# Patient Record
Sex: Male | Born: 1940 | Race: White | Hispanic: No | Smoking: Former smoker
Health system: Southern US, Community
[De-identification: ages and names within clinical notes are randomized; demographics above are authoritative.]

## PROBLEM LIST (undated history)

## (undated) DIAGNOSIS — K5730 Diverticulosis of large intestine without perforation or abscess without bleeding: Secondary | ICD-10-CM

## (undated) DIAGNOSIS — K635 Polyp of colon: Secondary | ICD-10-CM

## (undated) DIAGNOSIS — J40 Bronchitis, not specified as acute or chronic: Secondary | ICD-10-CM

## (undated) DIAGNOSIS — N400 Enlarged prostate without lower urinary tract symptoms: Secondary | ICD-10-CM

## (undated) DIAGNOSIS — J45909 Unspecified asthma, uncomplicated: Secondary | ICD-10-CM

## (undated) DIAGNOSIS — K449 Diaphragmatic hernia without obstruction or gangrene: Secondary | ICD-10-CM

## (undated) DIAGNOSIS — K219 Gastro-esophageal reflux disease without esophagitis: Secondary | ICD-10-CM

## (undated) DIAGNOSIS — I7121 Aneurysm of the ascending aorta, without rupture: Secondary | ICD-10-CM

## (undated) DIAGNOSIS — I350 Nonrheumatic aortic (valve) stenosis: Secondary | ICD-10-CM

## (undated) DIAGNOSIS — K420 Umbilical hernia with obstruction, without gangrene: Secondary | ICD-10-CM

## (undated) DIAGNOSIS — C801 Malignant (primary) neoplasm, unspecified: Secondary | ICD-10-CM

## (undated) DIAGNOSIS — I77810 Thoracic aortic ectasia: Secondary | ICD-10-CM

## (undated) DIAGNOSIS — R011 Cardiac murmur, unspecified: Secondary | ICD-10-CM

## (undated) DIAGNOSIS — R0602 Shortness of breath: Secondary | ICD-10-CM

## (undated) DIAGNOSIS — Z953 Presence of xenogenic heart valve: Secondary | ICD-10-CM

## (undated) DIAGNOSIS — J3089 Other allergic rhinitis: Secondary | ICD-10-CM

## (undated) DIAGNOSIS — C4491 Basal cell carcinoma of skin, unspecified: Secondary | ICD-10-CM

## (undated) DIAGNOSIS — I5189 Other ill-defined heart diseases: Secondary | ICD-10-CM

## (undated) DIAGNOSIS — N529 Male erectile dysfunction, unspecified: Secondary | ICD-10-CM

## (undated) DIAGNOSIS — I2510 Atherosclerotic heart disease of native coronary artery without angina pectoris: Secondary | ICD-10-CM

## (undated) HISTORY — DX: Aneurysm of the ascending aorta, without rupture: I71.21

## (undated) HISTORY — PX: KNEE ARTHROSCOPY: SUR90

## (undated) HISTORY — DX: Other ill-defined heart diseases: I51.89

## (undated) HISTORY — DX: Presence of xenogenic heart valve: Z95.3

## (undated) HISTORY — PX: CARDIAC CATHETERIZATION: SHX172

## (undated) HISTORY — DX: Other allergic rhinitis: J30.89

## (undated) HISTORY — PX: SHOULDER ARTHROSCOPY: SHX128

## (undated) HISTORY — DX: Unspecified asthma, uncomplicated: J45.909

## (undated) HISTORY — DX: Diverticulosis of large intestine without perforation or abscess without bleeding: K57.30

## (undated) HISTORY — PX: COLONOSCOPY W/ POLYPECTOMY: SHX1380

## (undated) HISTORY — DX: Gastro-esophageal reflux disease without esophagitis: K21.9

## (undated) HISTORY — DX: Basal cell carcinoma of skin, unspecified: C44.91

## (undated) HISTORY — DX: Diaphragmatic hernia without obstruction or gangrene: K44.9

## (undated) HISTORY — DX: Nonrheumatic aortic (valve) stenosis: I35.0

## (undated) HISTORY — DX: Atherosclerotic heart disease of native coronary artery without angina pectoris: I25.10

## (undated) HISTORY — DX: Benign prostatic hyperplasia without lower urinary tract symptoms: N40.0

## (undated) HISTORY — PX: FEMORAL HERNIA REPAIR: SHX632

## (undated) HISTORY — PX: TONSILLECTOMY: SUR1361

## (undated) HISTORY — PX: APPENDECTOMY: SHX54

## (undated) HISTORY — DX: Male erectile dysfunction, unspecified: N52.9

## (undated) HISTORY — DX: Thoracic aortic ectasia: I77.810

## (undated) HISTORY — DX: Polyp of colon: K63.5

## (undated) HISTORY — PX: HERNIA REPAIR: SHX51

---
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
## 2000-02-19 ENCOUNTER — Ambulatory Visit (HOSPITAL_BASED_OUTPATIENT_CLINIC_OR_DEPARTMENT_OTHER): Admission: RE | Admit: 2000-02-19 | Discharge: 2000-02-19 | Payer: Self-pay | Admitting: *Deleted

## 2002-11-03 ENCOUNTER — Encounter (INDEPENDENT_AMBULATORY_CARE_PROVIDER_SITE_OTHER): Payer: Self-pay | Admitting: Gastroenterology

## 2003-12-07 ENCOUNTER — Ambulatory Visit: Payer: Self-pay | Admitting: Gastroenterology

## 2005-05-15 ENCOUNTER — Encounter: Admission: RE | Admit: 2005-05-15 | Discharge: 2005-05-15 | Payer: Self-pay | Admitting: Orthopedic Surgery

## 2005-05-16 ENCOUNTER — Encounter: Admission: RE | Admit: 2005-05-16 | Discharge: 2005-05-16 | Payer: Self-pay | Admitting: Orthopedic Surgery

## 2008-02-10 ENCOUNTER — Encounter: Admission: RE | Admit: 2008-02-10 | Discharge: 2008-02-10 | Payer: Self-pay | Admitting: Family Medicine

## 2010-06-21 NOTE — Op Note (Signed)
[place]. [facility]  Patient:    Tim Hardy, Tim Hardy                      MRN: [id number] Proc. Date: 02/19/00 Attending:  Maisie Fus B. Samuella Cota, M.D. CC:         Quita Skye. Artis Flock, M.D.                           Operative Report  CCS# [id number]  PREOPERATIVE DIAGNOSIS:  Recurrent right inguinal hernia.  POSTOPERATIVE DIAGNOSIS:  Recurrent right inguinal hernia.  OPERATION PERFORMED:  Repair of recurrent right inguinal hernia with mesh.  SURGEON:  Maisie Fus B. Samuella Cota, M.D.  ANESTHESIA:  General.  ANESTHESIOLOGIST:  Dr. Krista Blue and CRNA.  DESCRIPTION OF PROCEDURE:  The patient was taken to the operating room and placed the table in supine position.  After satisfactory general anesthetic, the right lower quadrant of the abdomen was prepped and draped in the sterile field.  Even though the patient was under general anesthetic, I injected 10 cc of 0.25% without epinephrine and 1% Xylocaine without epinephrine.  This was used to block the ilioinguinal nerve in the area for incision underlying breast tissue.  The incision was made through the old scar with no attempt to remove the old scar.  Dissection revealed that the cord structures were in the subcutaneous position and were not below the external oblique aponeurosis. The dissection was then taken down to the external oblique aponeurosis inferiorly and medially.  The patient had a hernia the size of a large golf ball which was protruding from the inferior medial aspect of the inguinal floor.  The external oblique aponeurosis was divided up close to the internal ring.  The cord structures came out of the internal ring through the external oblique and were adherent in the subcutaneous tissues and it was felt best to leave the cord structures adhered to the subcutaneous tissues and not try to take them down.  There was no defect at the internal ring.  The hernia could not be reduced without making a bigger opening in the  inguinal floor inferiorly and medially.  The hernia sac was then reduced.  The external oblique aponeurosis was dissected back superiorly and inferiorly.  The hernia sac was reduced and an imbricating suture of 0 chromic catgut was used to keep the hernia sac reduced.  A piece of atrium mesh was then fashioned to cover the inguinal floor and extend up to the inferior aspect of the internal ring. It was felt best not to take down the internal ring around the cord structures because of the marked adhesions.  The mesh was sutured inferiorly to the shelving edge of Pouparts ligament using a running 2-0 Novofil.  It was anchored superiorly and medially with interrupted sutures of 0 Novofil.  The mesh was lying nicely over the entire inguinal floor extending across the midline at the symphysis.  The mesh stopped short of the internal ring.  The external oblique aponeurosis was then closed over the mesh using interrupted sutures of 3-0 Vicryl.  The wound was irrigated.  The cord structures were still adherent to the subcutaneous tissues. The subcutaneous tissues were then approximated with interrupted sutures of 3-0 Vicryl.  The skin was closed with running subcuticular suture of 5-0 Vicryl suture.  Benzoin and half-inch Steri-Strips were used to reinforce the skin closure.  Dry sterile dressing was applied.  The patient seemed to tolerate the  procedure well and was taken to the PACU in satisfactory condition. DD:  02/19/00 TD:  02/19/00 Job: [id number]

## 2011-07-31 ENCOUNTER — Other Ambulatory Visit: Payer: Self-pay

## 2011-10-16 ENCOUNTER — Other Ambulatory Visit: Payer: Self-pay | Admitting: Cardiology
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
## 2011-10-23 ENCOUNTER — Inpatient Hospital Stay (HOSPITAL_BASED_OUTPATIENT_CLINIC_OR_DEPARTMENT_OTHER)
Admission: RE | Admit: 2011-10-23 | Discharge: 2011-10-23 | Disposition: A | Discharge status: Home / Self Care | Payer: Medicare Other | Source: Ambulatory Visit | Attending: Cardiology | Admitting: Cardiology

## 2011-10-23 ENCOUNTER — Encounter (HOSPITAL_BASED_OUTPATIENT_CLINIC_OR_DEPARTMENT_OTHER): Payer: Self-pay

## 2011-10-23 ENCOUNTER — Encounter (HOSPITAL_BASED_OUTPATIENT_CLINIC_OR_DEPARTMENT_OTHER)
Admission: RE | Disposition: A | Discharge status: Home / Self Care | Payer: Self-pay | Source: Ambulatory Visit | Attending: Cardiology

## 2011-10-23 DIAGNOSIS — I2510 Atherosclerotic heart disease of native coronary artery without angina pectoris: Secondary | ICD-10-CM | POA: Insufficient documentation

## 2011-10-23 DIAGNOSIS — I359 Nonrheumatic aortic valve disorder, unspecified: Secondary | ICD-10-CM | POA: Diagnosis present

## 2011-10-23 DIAGNOSIS — R9439 Abnormal result of other cardiovascular function study: Secondary | ICD-10-CM | POA: Diagnosis present

## 2011-10-23 DIAGNOSIS — R0602 Shortness of breath: Secondary | ICD-10-CM | POA: Insufficient documentation

## 2011-10-23 DIAGNOSIS — R0789 Other chest pain: Secondary | ICD-10-CM | POA: Insufficient documentation

## 2011-10-23 LAB — POCT I-STAT 3, VENOUS BLOOD GAS (G3P V)
Bicarbonate: 26.9 mEq/L — ABNORMAL HIGH (ref 20.0–24.0)
pH, Ven: 7.375 — ABNORMAL HIGH (ref 7.250–7.300)
pO2, Ven: 36 mmHg (ref 30.0–45.0)

## 2011-10-23 LAB — POCT I-STAT 3, ART BLOOD GAS (G3+): Bicarbonate: 25.7 mEq/L — ABNORMAL HIGH (ref 20.0–24.0)

## 2011-10-23 SURGERY — JV LEFT AND RIGHT HEART CATHETERIZATION WITH CORONARY ANGIOGRAM
Anesthesia: Moderate Sedation

## 2011-10-23 MED ORDER — SODIUM CHLORIDE 0.9 % IV SOLN: 1.0000 mL/kg/h | INTRAVENOUS | Status: DC

## 2011-10-23 MED ORDER — SODIUM CHLORIDE 0.9 % IJ SOLN: 3.0000 mL | Freq: Two times a day (BID) | INTRAMUSCULAR | Status: DC

## 2011-10-23 MED ORDER — SODIUM CHLORIDE 0.9 % IJ SOLN: 3.0000 mL | INTRAMUSCULAR | Status: DC | PRN

## 2011-10-23 MED ORDER — SODIUM CHLORIDE 0.9 % IV SOLN: 250.0000 mL | INTRAVENOUS | Status: DC | PRN

## 2011-10-23 MED ORDER — ONDANSETRON HCL 4 MG/2ML IJ SOLN: 4.0000 mg | Freq: Four times a day (QID) | INTRAMUSCULAR | Status: DC | PRN

## 2011-10-23 MED ORDER — DIAZEPAM 5 MG PO TABS
5.0000 mg | ORAL_TABLET | ORAL | Status: AC
  Administered 2011-10-23: 5 mg via ORAL

## 2011-10-23 MED ORDER — SODIUM CHLORIDE 0.9 % IV SOLN: INTRAVENOUS | Status: DC

## 2011-10-23 MED ORDER — ACETAMINOPHEN 325 MG PO TABS: 650.0000 mg | ORAL_TABLET | ORAL | Status: DC | PRN

## 2011-10-23 MED ORDER — ASPIRIN 81 MG PO CHEW
324.0000 mg | CHEWABLE_TABLET | ORAL | Status: AC
  Administered 2011-10-23: 324 mg via ORAL

## 2011-10-23 NOTE — Interval H&P Note (Signed)
History and Physical Interval Note:  10/23/2011 8:35 AM  Tim Hardy  has presented today for surgery, with the diagnosis of AS  The various methods of treatment have been discussed with the patient and family. After consideration of risks, benefits and other options for treatment, the patient has consented to  Procedure(s) (LRB) with comments: JV LEFT AND RIGHT HEART CATHETERIZATION WITH CORONARY ANGIOGRAM (N/A) as a surgical intervention .  The patient's history has been reviewed, patient examined, no change in status, stable for surgery.  I have reviewed the patient's chart and labs.  Questions were answered to the patient's satisfaction.     SKAINS, MARK  After obtaining this information, I will be referring to cardiothoracic surgery.

## 2011-10-23 NOTE — OR Nursing (Signed)
Meal served 

## 2011-10-23 NOTE — OR Nursing (Signed)
Discharge instructions reviewed and signed, pt stated understanding, ambulated in hall without difficulty, site level 0, transported to friend's car via wheelchair 

## 2011-10-23 NOTE — CV Procedure (Addendum)
CARDIAC CATHETERIZATION  PROCEDURE:  Right and left heart catheterization with selective coronary angiography, ascending aortogram, abdominal aortogram, cardiac outputs, selective oxygen saturations.  INDICATIONS:  71 year old male with severe aortic stenosis, severe calcification of aortic valve, cannot exclude bicuspid valve with peak velocity of 5.2 m/s, mean gradient greater than 40 mm mercury with symptoms of shortness of breath. Poor exercise tolerance on treadmill with ST segment depression consistent with ischemia.  The risks, benefits, and details of the procedure were explained to the patient, including stroke, heart attack, death, renal impairment, arterial damage, bleeding.  The patient verbalized understanding and wanted to proceed.  Informed written consent was obtained.  PROCEDURE TECHNIQUE:  After Xylocaine anesthesia and visualization of the femoral head via fluoroscopy, a 49F sheath was placed in the right femoral artery using the modified Seldinger technique.  A 7 French sheath was inserted into the right femoral vein using the modified Seldinger technique. Left coronary angiography was done using a Judkins L4 catheter.  Right coronary angiography was done using a Judkins R4 catheter. Multiple views with hand injection of Omnipaque were obtained. A straight wire was utilized with the Judkins right catheter to attempt to cross the aortic valve. This was unsuccessful. A pigtail catheter was utilized for ascending aortogram as well as a descending aortogram. No evidence of coarctation or aneurysm. Right heart catheterization was performed with a balloon tipped Swan-Ganz catheter traversing the right-sided heart structures to the wedge position. Oxygen saturation was drawn in the pulmonary artery as well as femoral artery. Hemodynamics were obtained. Cardiac outputs were obtained utilizing the Fick and thermodilution methods. Following the procedure, sheaths were removed and patient was  hemodynamically stable.   CONTRAST:  Total of 100 ml.  FLOUROSCOPY TIME: 7.9 minutes.  COMPLICATIONS:  None.    HEMODYNAMICS:    Right atrium (RA): 10/8/7 mmHg Right ventricle (RV): 34/8/10 mmHg Pulmonary artery (PA): 34/15/23 mmHg Pulmonary capillary wedge pressure (PCWP): 17/14/12 mmHg  Cardiac output: Fick 5.6 liters/min, Thermodilution 4.4 liters/min Cardiac index: Fick 2.4, Thermodilution 1.9  PA saturation: 68 % FA saturation: 95 %   Aortic pressure: 117/67/88 mmHg LV pressure: Not obtained.     ANGIOGRAPHIC DATA:    Left main: No angiographically significant coronary artery disease branches into LAD and circumflex artery  Left anterior descending (LAD): There is mid LAD calcification at the bifurcation of a mid diagonal branch. Mild stenosis of approximately 30-40% noted in that region. No flow limiting disease present. The LAD then continues to wrap around the apex. There are 5 relatively small caliber diagonal branches. The first 2 proximal branches demonstrate minor ostial disease.  Circumflex artery (CIRC): There are 3 obtuse marginal branches. No angiographically significant disease present.  Right coronary artery (RCA): Dominant vessel giving rise to the posterior descending artery. No angiographically significant disease.  Ascending aortogram/descending aortogram: Performed in the LAO position. There is no evidence of coarctation of the aorta. There is no evidence of abdominal aortic aneurysm. Common iliacs are widely patent. Common femoral arteries are widely patent. Minor aortic sclerosis in the ascending aorta noted. Minor calcification of the left subclavian proximal artery noted. Severe calcification of the aortic valve with limited cusp excursion. IMPRESSIONS:  1. 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 No flow limiting disease. 2. No angiographic evidence of coarctation/abdominal aortic  aneurysm. 3. Normal right-sided heart pressures. 4. Normal cardiac output. 5.  Severely calcified aortic valve. Echocardiogram demonstrates severe aortic stenosis.  RECOMMENDATION:  I have put in referral  for cardiothoracic surgery for aortic valve replacement in the setting of symptomatic severe aortic stenosis. Once again, I cannot exclude bicuspid aortic valve based upon transthoracic echocardiogram. There does not appear to be evidence of coarctation. If transesophageal echocardiogram is needed prior to surgery, I will be happy to perform at the request of surgery. Explained findings to patient.
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
## 2011-10-23 NOTE — OR Nursing (Signed)
Dr Anne Fu at bedside to discuss results and treatment plan with pt and friend

## 2011-10-23 NOTE — OR Nursing (Signed)
Tegaderm dressing applied, site level 0, bedrest begins 0935

## 2011-10-24 ENCOUNTER — Encounter (HOSPITAL_BASED_OUTPATIENT_CLINIC_OR_DEPARTMENT_OTHER): Payer: Self-pay

## 2011-10-28 ENCOUNTER — Institutional Professional Consult (permissible substitution) (INDEPENDENT_AMBULATORY_CARE_PROVIDER_SITE_OTHER): Payer: Medicare Other | Admitting: Thoracic Surgery (Cardiothoracic Vascular Surgery)

## 2011-10-28 ENCOUNTER — Encounter: Payer: Self-pay | Admitting: Thoracic Surgery (Cardiothoracic Vascular Surgery)

## 2011-10-28 VITALS — BP 138/88 | HR 94 | Resp 16 | Ht 73.0 in | Wt 236.0 lb

## 2011-10-28 DIAGNOSIS — J45909 Unspecified asthma, uncomplicated: Secondary | ICD-10-CM

## 2011-10-28 DIAGNOSIS — I350 Nonrheumatic aortic (valve) stenosis: Secondary | ICD-10-CM

## 2011-10-28 DIAGNOSIS — I359 Nonrheumatic aortic valve disorder, unspecified: Secondary | ICD-10-CM

## 2011-10-28 NOTE — Progress Notes (Signed)
PCP is GRIFFIN,JOHN JOSEPH, MD Referring Provider is Skains, Mark, MD  Chief Complaint  Patient presents with  . Aortic Stenosis    eval and treat...ECHO/CATH    HPI: 71-year-old gentleman presents with chief complaint of shortness of breath with exertion.  Tim Hardy is a 71-year-old gentleman with known aortic stenosis. He recently has been having shortness of breath with exertion. He first noticed a couple of years ago but it did pretty significant exertion because the shortness of breath. He's had allergies for 50 years and asthma for a long time and he first attributed his symptoms to those problems. Over the last several months however he's noted that he gets severely short of breath with only mild exertion, such as walking 100 -150 feet. He also feels very tired and fatigued when he exerts himself. He also notes shortness of breath and fatigue when he bends over to try to lift anything. He had an echocardiogram in September of 2012 which showed moderate to severe aortic stenosis, but the valve was not visualized well. He went to Dr. Griffin because of progressive shortness of breath and fatigue and was referred to Dr. Skains. An echocardiogram showed severe aortic stenosis with a V-max of 494 cm/s, peak gradient of 98 mm mercury, mean gradient of 64 mm mercury, and a calculated valve area of 0.5 cm. He then underwent cardiac catheterization. The aortic valve could not be crossed. His right-sided pressures were normal and his cardiac index was 2.4 by Fick and 1.9 by thermodilution. 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 BPH (benign prostatic hyperplasia)   . GERD (gastroesophageal reflux disease)   . Perennial allergic rhinitis   . Aortic valve stenosis, moderate     LVH  . Diastolic dysfunction   . Asthma   . Colon polyps   . Colon, diverticulosis   . Hiatal hernia   . ED (erectile  dysfunction)     No past surgical history on file.  Family History  Problem Relation Age of Onset  . Cancer Father     lung and colon  . Hypertension Father   . Diabetes Sister   . Diabetes Son     Social History History  Substance Use Topics  . Smoking status: Former Smoker    Types: Cigarettes    Start date: 02/04/1971  . Smokeless tobacco: Current User    Types: Chew  . Alcohol Use: No    Current Outpatient Prescriptions  Medication Sig Dispense Refill  . acetaminophen-codeine 120-12 MG/5ML solution Take 10 mLs by mouth every 6 (six) hours as needed.       . albuterol (PROVENTIL HFA;VENTOLIN HFA) 108 (90 BASE) MCG/ACT inhaler Inhale 2 puffs into the lungs every 6 (six) hours as needed.      . aspirin 81 MG tablet Take 81 mg by mouth daily.      . budesonide-formoterol (SYMBICORT) 160-4.5 MCG/ACT inhaler Inhale 2 puffs into the lungs 2 (two) times daily.      . cetirizine (ZYRTEC) 10 MG tablet Take 10 mg by mouth daily.      . fluticasone (FLONASE) 50 MCG/ACT nasal spray Place 2 sprays into the nose daily.      . NEXIUM 40 MG capsule Take 40 mg by mouth daily before breakfast.       . sildenafil (VIAGRA) 50 MG tablet Take 50 mg by mouth daily as needed.      .   Tamsulosin HCl (FLOMAX) 0.4 MG CAPS Take 0.4 mg by mouth daily after supper.         Allergies  Allergen Reactions  . Penicillins Rash    Review of Systems  Constitutional: Positive for activity change (decreased energy).  HENT:       Saw dentist 5/13- no issues  Respiratory: Positive for shortness of breath (with mild exertion, increasing frequency and severity) and wheezing.   Cardiovascular: Negative for palpitations and leg swelling.  Gastrointestinal:       Reflux  Genitourinary: Negative.   Neurological: Negative.   Hematological: Bruises/bleeds easily (on aspirin).  Psychiatric/Behavioral: Negative.   All other systems reviewed and are negative.    BP 138/88  Pulse 94  Resp 16  Ht 6' 1"  (1.854 m)  Wt 236 lb (107.049 kg)  BMI 31.14 kg/m2  SpO2 93% Physical Exam  Constitutional: He is oriented to person, place, and time. He appears well-developed and well-nourished. No distress.  HENT:  Head: Normocephalic and atraumatic.  Eyes: EOM are normal. Pupils are equal, round, and reactive to light.  Neck: No JVD present. No thyromegaly present.  Cardiovascular: Normal rate and regular rhythm.   Murmur (3/6 harsh murmur heard throughout precordium) heard. Pulmonary/Chest: Effort normal and breath sounds normal. He has no wheezes. He has no rales.  Abdominal: Soft. There is no tenderness.       Umbilical hernia  Musculoskeletal: Normal range of motion. He exhibits no edema.  Lymphadenopathy:    He has no cervical adenopathy.  Neurological: He is alert and oriented to person, place, and time. No cranial nerve deficit.       No motor deficit  Skin: Skin is warm and dry.  Psychiatric: He has a normal mood and affect.     Diagnostic Tests: Echocardiogram 10/15/2011 and cardiac catheterization 10/23/2011 as previously noted  Impression: 71-year-old gentleman with symptomatic, critical aortic stenosis. Aortic valve replacement is indicated for survival benefit as well as relief of symptoms.  I discussed with Tim Hardy and his friend who accompanied him the valve options. He understands the major categories of prosthetic valves include mechanical and tissue valves. We discussed the relative advantages and disadvantages of each. Given his age of 71 a tissue valve would be the valve of choice. He agrees to tissue valve replacement. He had questions regarding newer percutaneous approaches. I explained to him that he is not a candidate for that procedure as he is a good candidate for conventional valve replacement.  I discussed in detail with them the indications, risks, benefits, and alternatives. We talked about the surgical approach, use of right coronary bypass, the incisions to  be used, need for general anesthesia, expected hospital stay and overall recovery. They understand the risks of aortic valve replacement include, but are not limited to, death, MI, stroke, DVT, PE, bleeding, possible need for transfusion, infection, complete heart block requiring permanent pacemaker, cardiac arrhythmias, other organ system dysfunction including respiratory, renal, or GI complications. They also understand there is a possibility of unforeseeable complications. He understands and accepts these risks and wishes to proceed.  Plan: Aortic valve replacement be a partial sternotomy on Monday, 11/03/2011 

## 2011-10-29 ENCOUNTER — Other Ambulatory Visit: Payer: Self-pay | Admitting: *Deleted

## 2011-10-29 ENCOUNTER — Other Ambulatory Visit: Payer: Self-pay | Admitting: Thoracic Surgery (Cardiothoracic Vascular Surgery)

## 2011-10-29 DIAGNOSIS — I359 Nonrheumatic aortic valve disorder, unspecified: Secondary | ICD-10-CM

## 2011-10-30 ENCOUNTER — Ambulatory Visit (HOSPITAL_COMMUNITY)
Admission: RE | Admit: 2011-10-30 | Discharge: 2011-10-30 | Disposition: A | Discharge status: Home / Self Care | Payer: Medicare Other | Source: Ambulatory Visit | Attending: Thoracic Surgery (Cardiothoracic Vascular Surgery) | Admitting: Thoracic Surgery (Cardiothoracic Vascular Surgery)

## 2011-10-30 ENCOUNTER — Encounter (HOSPITAL_COMMUNITY): Payer: Self-pay | Admitting: Pharmacy Technician

## 2011-10-30 ENCOUNTER — Encounter (HOSPITAL_COMMUNITY): Payer: Self-pay

## 2011-10-30 ENCOUNTER — Inpatient Hospital Stay (HOSPITAL_COMMUNITY)
Admission: RE | Admit: 2011-10-30 | Discharge: 2011-10-30 | Disposition: A | Discharge status: Home / Self Care | Payer: Medicare Other | Source: Ambulatory Visit | Attending: Thoracic Surgery (Cardiothoracic Vascular Surgery) | Admitting: Thoracic Surgery (Cardiothoracic Vascular Surgery)

## 2011-10-30 ENCOUNTER — Encounter (HOSPITAL_COMMUNITY)
Admission: RE | Admit: 2011-10-30 | Discharge: 2011-10-30 | Disposition: A | Discharge status: Home / Self Care | Payer: Medicare Other | Source: Ambulatory Visit | Attending: Thoracic Surgery (Cardiothoracic Vascular Surgery) | Admitting: Thoracic Surgery (Cardiothoracic Vascular Surgery)

## 2011-10-30 ENCOUNTER — Other Ambulatory Visit (HOSPITAL_COMMUNITY): Payer: Self-pay

## 2011-10-30 VITALS — BP 132/72 | HR 85 | Temp 99.3°F | Resp 20 | Ht 73.0 in | Wt 235.8 lb

## 2011-10-30 DIAGNOSIS — Z01818 Encounter for other preprocedural examination: Secondary | ICD-10-CM | POA: Insufficient documentation

## 2011-10-30 DIAGNOSIS — R011 Cardiac murmur, unspecified: Secondary | ICD-10-CM | POA: Insufficient documentation

## 2011-10-30 DIAGNOSIS — I359 Nonrheumatic aortic valve disorder, unspecified: Secondary | ICD-10-CM | POA: Insufficient documentation

## 2011-10-30 DIAGNOSIS — Z01810 Encounter for preprocedural cardiovascular examination: Secondary | ICD-10-CM

## 2011-10-30 DIAGNOSIS — I6529 Occlusion and stenosis of unspecified carotid artery: Secondary | ICD-10-CM | POA: Insufficient documentation

## 2011-10-30 DIAGNOSIS — Z01812 Encounter for preprocedural laboratory examination: Secondary | ICD-10-CM | POA: Insufficient documentation

## 2011-10-30 HISTORY — DX: Shortness of breath: R06.02

## 2011-10-30 HISTORY — DX: Cardiac murmur, unspecified: R01.1

## 2011-10-30 HISTORY — DX: Malignant (primary) neoplasm, unspecified: C80.1

## 2011-10-30 HISTORY — DX: Bronchitis, not specified as acute or chronic: J40

## 2011-10-30 LAB — URINALYSIS, ROUTINE W REFLEX MICROSCOPIC
Glucose, UA: NEGATIVE mg/dL
Ketones, ur: NEGATIVE mg/dL
Leukocytes, UA: NEGATIVE
Protein, ur: NEGATIVE mg/dL
Specific Gravity, Urine: 1.016 (ref 1.005–1.030)
Urobilinogen, UA: 1 mg/dL (ref 0.0–1.0)
pH: 5.5 (ref 5.0–8.0)

## 2011-10-30 LAB — TYPE AND SCREEN

## 2011-10-30 LAB — ABO/RH: ABO/RH(D): A POS

## 2011-10-30 LAB — SURGICAL PCR SCREEN: Staphylococcus aureus: NEGATIVE

## 2011-10-30 LAB — BLOOD GAS, ARTERIAL
Acid-base deficit: 0.3 mmol/L (ref 0.0–2.0)
O2 Saturation: 97.3 %
TCO2: 24.2 mmol/L (ref 0–100)
pCO2 arterial: 33.3 mmHg — ABNORMAL LOW (ref 35.0–45.0)
pO2, Arterial: 88.7 mmHg (ref 80.0–100.0)

## 2011-10-30 LAB — PULMONARY FUNCTION TEST

## 2011-10-30 LAB — COMPREHENSIVE METABOLIC PANEL
ALT: 22 U/L (ref 0–53)
BUN: 13 mg/dL (ref 6–23)
CO2: 22 mEq/L (ref 19–32)
Calcium: 9.4 mg/dL (ref 8.4–10.5)
Creatinine, Ser: 0.95 mg/dL (ref 0.50–1.35)
GFR calc Af Amer: >90 mL/min (ref >90)
GFR calc non Af Amer: 82 mL/min — ABNORMAL LOW (ref >90)
Glucose, Bld: 114 mg/dL — ABNORMAL HIGH (ref 70–99)
Total Protein: 6.5 g/dL (ref 6.0–8.3)

## 2011-10-30 LAB — CBC
Hemoglobin: 14.8 g/dL (ref 13.0–17.0)
MCH: 31.9 pg (ref 26.0–34.0)
MCHC: 34.7 g/dL (ref 30.0–36.0)
MCV: 91.8 fL (ref 78.0–100.0)
RBC: 4.64 MIL/uL (ref 4.22–5.81)

## 2011-10-30 LAB — HEMOGLOBIN A1C
Hgb A1c MFr Bld: 5.5 % (ref <5.7)
Mean Plasma Glucose: 111 mg/dL (ref <117)

## 2011-10-30 LAB — PROTIME-INR: Prothrombin Time: 12.8 seconds (ref 11.6–15.2)

## 2011-10-30 LAB — APTT: aPTT: 33 seconds (ref 24–37)

## 2011-10-30 MED ORDER — ALBUTEROL SULFATE (5 MG/ML) 0.5% IN NEBU
2.5000 mg | INHALATION_SOLUTION | Freq: Once | RESPIRATORY_TRACT | Status: AC
  Administered 2011-10-30: 2.5 mg via RESPIRATORY_TRACT

## 2011-10-30 NOTE — Progress Notes (Signed)
VASCULAR LAB PRELIMINARY  PRELIMINARY  PRELIMINARY  PRELIMINARY  Pre-op Cardiac Surgery  Carotid Findings:  Bilateral:  No evidence of hemodynamically significant internal carotid artery stenosis.   Vertebral artery flow is antegrade.     Upper Extremity Right Left  Brachial Pressures 132 Triphasic 129 Triphasic  Radial Waveforms Triphasic Triphasic  Ulnar Waveforms Triphasic Triphasic  Palmar Arch (Allen's Test) Normal Normal   Findings:  Doppler waveforms remained normal bilaterally with both radial and ulnar compressions                             Mlissa Tamayo, RVS 10/30/2011, 12:37 PM

## 2011-10-30 NOTE — Pre-Procedure Instructions (Signed)
20 Tim Hardy  10/30/2011   Your procedure is scheduled on:  Monday November 03, 2011.  Report to [facility] at 0530 AM.  Call this number if you have problems the morning of surgery: [phone]   Remember:   Do not eat food or drink:After Midnight.    Take these medicines the morning of surgery with A SIP OF WATER: Albuterol inhaler if needed for shortness of breath, Symbicort, Nexium,   Do not wear jewelry.  Do not wear lotions or colognes.  Men may shave face and neck.  Do not bring valuables to the hospital.  Contacts, dentures or bridgework may not be worn into surgery.  Leave suitcase in the car. After surgery it may be brought to your room.  For patients admitted to the hospital, checkout time is 11:00 AM the day of discharge.   Patients discharged the day of surgery will not be allowed to drive home.  Name and phone number of your driver:   Special Instructions: Shower using CHG 2 nights before surgery and the night before surgery.  If you shower the day of surgery use CHG.  Use special wash - you have one bottle of CHG for all showers.  You should use approximately 1/3 of the bottle for each shower.   Please read over the following fact sheets that you were given: Pain Booklet, Coughing and Deep Breathing, Blood Transfusion Information, Open Heart Packet, MRSA Information and Surgical Site Infection Prevention

## 2011-10-30 NOTE — Progress Notes (Signed)
Ryan paged per protocol.

## 2011-11-02 MED ORDER — AMINOCAPROIC ACID 250 MG/ML IV SOLN
INTRAVENOUS | Status: DC
  Filled 2011-11-02: qty 40

## 2011-11-02 MED ORDER — MAGNESIUM SULFATE 50 % IJ SOLN
40.0000 meq | INTRAMUSCULAR | Status: DC
  Filled 2011-11-02: qty 10

## 2011-11-02 MED ORDER — PLASMA-LYTE 148 IV SOLN
INTRAVENOUS | Status: DC
  Filled 2011-11-02: qty 2.5

## 2011-11-02 MED ORDER — DEXTROSE 5 % IV SOLN
750.0000 mg | INTRAVENOUS | Status: DC
  Filled 2011-11-02: qty 750

## 2011-11-02 MED ORDER — DOPAMINE-DEXTROSE 3.2-5 MG/ML-% IV SOLN
2.0000 ug/kg/min | INTRAVENOUS | Status: DC
  Filled 2011-11-02: qty 250

## 2011-11-02 MED ORDER — EPINEPHRINE HCL 1 MG/ML IJ SOLN
0.5000 ug/min | INTRAVENOUS | Status: DC
  Filled 2011-11-02: qty 4

## 2011-11-02 MED ORDER — NITROGLYCERIN IN D5W 200-5 MCG/ML-% IV SOLN
2.0000 ug/min | INTRAVENOUS | Status: DC
  Filled 2011-11-02: qty 250

## 2011-11-02 MED ORDER — CHLORHEXIDINE GLUCONATE 4 % EX LIQD: 30.0000 mL | CUTANEOUS | Status: DC

## 2011-11-02 MED ORDER — METOPROLOL TARTRATE 12.5 MG HALF TABLET
12.5000 mg | ORAL_TABLET | Freq: Once | ORAL | Status: AC
  Administered 2011-11-03: 12.5 mg via ORAL
  Filled 2011-11-02: qty 1

## 2011-11-02 MED ORDER — POTASSIUM CHLORIDE 2 MEQ/ML IV SOLN
80.0000 meq | INTRAVENOUS | Status: DC
  Filled 2011-11-02: qty 40

## 2011-11-02 MED ORDER — VANCOMYCIN HCL 1000 MG IV SOLR
1250.0000 mg | INTRAVENOUS | Status: DC
  Filled 2011-11-02: qty 1250

## 2011-11-02 MED ORDER — PHENYLEPHRINE HCL 10 MG/ML IJ SOLN
30.0000 ug/min | INTRAVENOUS | Status: DC
  Filled 2011-11-02: qty 2

## 2011-11-02 MED ORDER — DEXTROSE 5 % IV SOLN
1.5000 g | INTRAVENOUS | Status: DC
  Filled 2011-11-02: qty 1.5

## 2011-11-02 MED ORDER — DEXMEDETOMIDINE HCL IN NACL 400 MCG/100ML IV SOLN
0.1000 ug/kg/h | INTRAVENOUS | Status: DC
  Filled 2011-11-02: qty 100

## 2011-11-02 MED ORDER — SODIUM CHLORIDE 0.9 % IV SOLN
INTRAVENOUS | Status: DC
  Filled 2011-11-02: qty 1

## 2011-11-02 MED ORDER — MOXIFLOXACIN HCL IN NACL 400 MG/250ML IV SOLN
400.0000 mg | INTRAVENOUS | Status: DC
  Filled 2011-11-02 (×2): qty 250

## 2011-11-03 ENCOUNTER — Encounter (HOSPITAL_COMMUNITY): Payer: Self-pay | Admitting: Anesthesiology

## 2011-11-03 ENCOUNTER — Inpatient Hospital Stay (HOSPITAL_COMMUNITY): Payer: Medicare Other

## 2011-11-03 ENCOUNTER — Ambulatory Visit (HOSPITAL_COMMUNITY): Payer: Medicare Other | Admitting: Anesthesiology

## 2011-11-03 ENCOUNTER — Encounter (HOSPITAL_COMMUNITY)
Admission: RE | Disposition: A | Discharge status: Home / Self Care | Payer: Self-pay | Source: Ambulatory Visit | Attending: Thoracic Surgery (Cardiothoracic Vascular Surgery)

## 2011-11-03 ENCOUNTER — Inpatient Hospital Stay (HOSPITAL_COMMUNITY)
Admission: RE | Admit: 2011-11-03 | Discharge: 2011-11-07 | DRG: 220 | Disposition: A | Discharge status: Home / Self Care | Payer: Medicare Other | Source: Ambulatory Visit | Attending: Thoracic Surgery (Cardiothoracic Vascular Surgery) | Admitting: Thoracic Surgery (Cardiothoracic Vascular Surgery)

## 2011-11-03 ENCOUNTER — Encounter (HOSPITAL_COMMUNITY): Payer: Self-pay | Admitting: *Deleted

## 2011-11-03 DIAGNOSIS — Z87891 Personal history of nicotine dependence: Secondary | ICD-10-CM

## 2011-11-03 DIAGNOSIS — Z952 Presence of prosthetic heart valve: Secondary | ICD-10-CM

## 2011-11-03 DIAGNOSIS — D696 Thrombocytopenia, unspecified: Secondary | ICD-10-CM | POA: Diagnosis not present

## 2011-11-03 DIAGNOSIS — N400 Enlarged prostate without lower urinary tract symptoms: Secondary | ICD-10-CM | POA: Diagnosis present

## 2011-11-03 DIAGNOSIS — I359 Nonrheumatic aortic valve disorder, unspecified: Secondary | ICD-10-CM

## 2011-11-03 DIAGNOSIS — D62 Acute posthemorrhagic anemia: Secondary | ICD-10-CM | POA: Diagnosis not present

## 2011-11-03 DIAGNOSIS — Z7982 Long term (current) use of aspirin: Secondary | ICD-10-CM

## 2011-11-03 DIAGNOSIS — K219 Gastro-esophageal reflux disease without esophagitis: Secondary | ICD-10-CM | POA: Diagnosis present

## 2011-11-03 HISTORY — PX: AORTIC VALVE REPLACEMENT: SHX41

## 2011-11-03 HISTORY — PX: STERNOTOMY: SHX1057

## 2011-11-03 LAB — POCT I-STAT 3, ART BLOOD GAS (G3+)
Acid-base deficit: 2 mmol/L (ref 0.0–2.0)
Acid-base deficit: 3 mmol/L — ABNORMAL HIGH (ref 0.0–2.0)
Bicarbonate: 23.5 mEq/L (ref 20.0–24.0)
O2 Saturation: 100 %
Patient temperature: 37.2
TCO2: 26 mmol/L (ref 0–100)
pCO2 arterial: 36.9 mmHg (ref 35.0–45.0)
pCO2 arterial: 42.9 mmHg (ref 35.0–45.0)
pH, Arterial: 7.359 (ref 7.350–7.450)
pH, Arterial: 7.387 (ref 7.350–7.450)
pO2, Arterial: 80 mmHg (ref 80.0–100.0)
pO2, Arterial: 97 mmHg (ref 80.0–100.0)

## 2011-11-03 LAB — POCT I-STAT 4, (NA,K, GLUC, HGB,HCT)
Glucose, Bld: 88 mg/dL (ref 70–99)
Glucose, Bld: 92 mg/dL (ref 70–99)
Glucose, Bld: 179 mg/dL — ABNORMAL HIGH (ref 70–99)
HCT: 38 % — ABNORMAL LOW (ref 39.0–52.0)
HCT: 35 % — ABNORMAL LOW (ref 39.0–52.0)
HCT: 30 % — ABNORMAL LOW (ref 39.0–52.0)
HCT: 35 % — ABNORMAL LOW (ref 39.0–52.0)
Hemoglobin: 12.9 g/dL — ABNORMAL LOW (ref 13.0–17.0)
Hemoglobin: 9.9 g/dL — ABNORMAL LOW (ref 13.0–17.0)
Hemoglobin: 11.9 g/dL — ABNORMAL LOW (ref 13.0–17.0)
Potassium: 3.7 mEq/L (ref 3.5–5.1)
Potassium: 6.3 mEq/L (ref 3.5–5.1)
Potassium: 5.7 mEq/L — ABNORMAL HIGH (ref 3.5–5.1)
Sodium: 139 mEq/L (ref 135–145)
Sodium: 134 mEq/L — ABNORMAL LOW (ref 135–145)
Sodium: 138 mEq/L (ref 135–145)

## 2011-11-03 LAB — CBC
Hemoglobin: 12.5 g/dL — ABNORMAL LOW (ref 13.0–17.0)
MCH: 31.9 pg (ref 26.0–34.0)
MCH: 32.1 pg (ref 26.0–34.0)
MCHC: 34.7 g/dL (ref 30.0–36.0)
MCV: 91.8 fL (ref 78.0–100.0)
MCV: 92.1 fL (ref 78.0–100.0)
Platelets: 121 10*3/uL — ABNORMAL LOW (ref 150–400)
Platelets: 122 10*3/uL — ABNORMAL LOW (ref 150–400)
RBC: 3.9 MIL/uL — ABNORMAL LOW (ref 4.22–5.81)
RDW: 13.3 % (ref 11.5–15.5)
WBC: 12.2 10*3/uL — ABNORMAL HIGH (ref 4.0–10.5)

## 2011-11-03 LAB — CREATININE, SERUM
GFR calc Af Amer: 84 mL/min — ABNORMAL LOW (ref >90)
GFR calc non Af Amer: 72 mL/min — ABNORMAL LOW (ref >90)

## 2011-11-03 LAB — POCT I-STAT, CHEM 8
Glucose, Bld: 115 mg/dL — ABNORMAL HIGH (ref 70–99)
HCT: 34 % — ABNORMAL LOW (ref 39.0–52.0)
Hemoglobin: 11.6 g/dL — ABNORMAL LOW (ref 13.0–17.0)
Potassium: 4.6 mEq/L (ref 3.5–5.1)
Sodium: 140 mEq/L (ref 135–145)

## 2011-11-03 LAB — POCT I-STAT GLUCOSE: Glucose, Bld: 177 mg/dL — ABNORMAL HIGH (ref 70–99)

## 2011-11-03 LAB — MAGNESIUM: Magnesium: 3.1 mg/dL — ABNORMAL HIGH (ref 1.5–2.5)

## 2011-11-03 LAB — HEMOGLOBIN AND HEMATOCRIT, BLOOD: HCT: 30.5 % — ABNORMAL LOW (ref 39.0–52.0)

## 2011-11-03 LAB — GLUCOSE, CAPILLARY
Glucose-Capillary: 95 mg/dL (ref 70–99)
Glucose-Capillary: 110 mg/dL — ABNORMAL HIGH (ref 70–99)

## 2011-11-03 LAB — PROTIME-INR: Prothrombin Time: 17.3 seconds — ABNORMAL HIGH (ref 11.6–15.2)

## 2011-11-03 SURGERY — REPLACEMENT, AORTIC VALVE, OPEN
Anesthesia: General | Site: Chest | Wound class: Clean

## 2011-11-03 MED ORDER — INSULIN REGULAR BOLUS VIA INFUSION
0.0000 [IU] | Freq: Three times a day (TID) | INTRAVENOUS | Status: DC
  Filled 2011-11-03: qty 10

## 2011-11-03 MED ORDER — ACETAMINOPHEN 500 MG PO TABS
1000.0000 mg | ORAL_TABLET | Freq: Four times a day (QID) | ORAL | Status: DC
  Administered 2011-11-03 – 2011-11-06 (×11): 1000 mg via ORAL
  Filled 2011-11-03 (×18): qty 2

## 2011-11-03 MED ORDER — ASPIRIN 81 MG PO CHEW: 324.0000 mg | CHEWABLE_TABLET | Freq: Every day | ORAL | Status: DC

## 2011-11-03 MED ORDER — MILRINONE IN DEXTROSE 200-5 MCG/ML-% IV SOLN
0.1250 ug/kg/min | INTRAVENOUS | Status: DC
  Filled 2011-11-03: qty 100

## 2011-11-03 MED ORDER — ALBUMIN HUMAN 5 % IV SOLN
250.0000 mL | INTRAVENOUS | Status: DC | PRN
  Administered 2011-11-03 (×2): 250 mL via INTRAVENOUS
  Filled 2011-11-03: qty 250

## 2011-11-03 MED ORDER — GLYCOPYRROLATE 0.2 MG/ML IJ SOLN
INTRAMUSCULAR | Status: DC | PRN
  Administered 2011-11-03: 0.4 mg via INTRAVENOUS

## 2011-11-03 MED ORDER — LACTATED RINGERS IV SOLN: 500.0000 mL | Freq: Once | INTRAVENOUS | Status: AC | PRN

## 2011-11-03 MED ORDER — ONDANSETRON HCL 4 MG/2ML IJ SOLN
4.0000 mg | Freq: Four times a day (QID) | INTRAMUSCULAR | Status: DC | PRN
  Administered 2011-11-04: 4 mg via INTRAVENOUS
  Filled 2011-11-03: qty 2

## 2011-11-03 MED ORDER — FLUTICASONE PROPIONATE 50 MCG/ACT NA SUSP
2.0000 | Freq: Every day | NASAL | Status: DC
  Administered 2011-11-04 – 2011-11-07 (×4): 2 via NASAL
  Filled 2011-11-03: qty 16

## 2011-11-03 MED ORDER — PHENYLEPHRINE HCL 10 MG/ML IJ SOLN
20.0000 mg | INTRAVENOUS | Status: DC | PRN
  Administered 2011-11-03: 25 ug/min via INTRAVENOUS

## 2011-11-03 MED ORDER — 0.9 % SODIUM CHLORIDE (POUR BTL) OPTIME
TOPICAL | Status: DC | PRN
  Administered 2011-11-03: 6000 mL

## 2011-11-03 MED ORDER — LIDOCAINE HCL (CARDIAC) 20 MG/ML IV SOLN
INTRAVENOUS | Status: DC | PRN
  Administered 2011-11-03: 100 mg via INTRAVENOUS

## 2011-11-03 MED ORDER — SODIUM CHLORIDE 0.9 % IV SOLN
INTRAVENOUS | Status: DC | PRN
  Administered 2011-11-03 (×2): via INTRAVENOUS

## 2011-11-03 MED ORDER — SODIUM CHLORIDE 0.9 % IJ SOLN: 3.0000 mL | INTRAMUSCULAR | Status: DC | PRN

## 2011-11-03 MED ORDER — METOPROLOL TARTRATE 25 MG/10 ML ORAL SUSPENSION
12.5000 mg | Freq: Two times a day (BID) | ORAL | Status: DC
  Administered 2011-11-04: 12.5 mg
  Filled 2011-11-03 (×3): qty 5

## 2011-11-03 MED ORDER — NITROGLYCERIN IN D5W 200-5 MCG/ML-% IV SOLN: 0.0000 ug/min | INTRAVENOUS | Status: DC

## 2011-11-03 MED ORDER — DOCUSATE SODIUM 100 MG PO CAPS
200.0000 mg | ORAL_CAPSULE | Freq: Every day | ORAL | Status: DC
  Administered 2011-11-04 – 2011-11-06 (×3): 200 mg via ORAL
  Filled 2011-11-03 (×4): qty 2

## 2011-11-03 MED ORDER — SODIUM CHLORIDE 0.9 % IJ SOLN: 3.0000 mL | Freq: Two times a day (BID) | INTRAMUSCULAR | Status: DC

## 2011-11-03 MED ORDER — MAGNESIUM SULFATE 40 MG/ML IJ SOLN
4.0000 g | Freq: Once | INTRAMUSCULAR | Status: AC
  Administered 2011-11-03: 4 g via INTRAVENOUS
  Filled 2011-11-03: qty 100

## 2011-11-03 MED ORDER — TAMSULOSIN HCL 0.4 MG PO CAPS
0.4000 mg | ORAL_CAPSULE | Freq: Every day | ORAL | Status: DC
  Administered 2011-11-04 – 2011-11-06 (×3): 0.4 mg via ORAL
  Filled 2011-11-03 (×5): qty 1

## 2011-11-03 MED ORDER — LACTATED RINGERS IV SOLN
INTRAVENOUS | Status: DC | PRN
  Administered 2011-11-03: 07:00:00 via INTRAVENOUS

## 2011-11-03 MED ORDER — HEPARIN SODIUM (PORCINE) 1000 UNIT/ML IJ SOLN
INTRAMUSCULAR | Status: DC | PRN
  Administered 2011-11-03: 41000 [IU] via INTRAVENOUS

## 2011-11-03 MED ORDER — FENTANYL CITRATE 0.05 MG/ML IJ SOLN
INTRAMUSCULAR | Status: DC | PRN
  Administered 2011-11-03: 50 ug via INTRAVENOUS
  Administered 2011-11-03 (×4): 250 ug via INTRAVENOUS
  Administered 2011-11-03: 50 ug via INTRAVENOUS

## 2011-11-03 MED ORDER — SODIUM CHLORIDE 0.9 % IV SOLN
100.0000 [IU] | INTRAVENOUS | Status: DC | PRN
  Administered 2011-11-03: 1.2 [IU]/h via INTRAVENOUS

## 2011-11-03 MED ORDER — VANCOMYCIN HCL 1000 MG IV SOLR
1.2500 mg | INTRAVENOUS | Status: DC | PRN
  Administered 2011-11-03: 08:00:00 via INTRAVENOUS
  Administered 2011-11-03: 1.25 mg via INTRAVENOUS

## 2011-11-03 MED ORDER — MAGNESIUM SULFATE 50 % IJ SOLN: 40.0000 meq | INTRAMUSCULAR | Status: DC

## 2011-11-03 MED ORDER — ACETAMINOPHEN 650 MG RE SUPP
650.0000 mg | RECTAL | Status: AC
  Administered 2011-11-03: 650 mg via RECTAL

## 2011-11-03 MED ORDER — SODIUM CHLORIDE 0.9 % IV SOLN
200.0000 ug | INTRAVENOUS | Status: DC | PRN
  Administered 2011-11-03: .7 ug/kg/h via INTRAVENOUS

## 2011-11-03 MED ORDER — VANCOMYCIN HCL IN DEXTROSE 1-5 GM/200ML-% IV SOLN
1000.0000 mg | Freq: Once | INTRAVENOUS | Status: AC
  Administered 2011-11-03: 1000 mg via INTRAVENOUS
  Filled 2011-11-03: qty 200

## 2011-11-03 MED ORDER — FAMOTIDINE IN NACL 20-0.9 MG/50ML-% IV SOLN
20.0000 mg | Freq: Two times a day (BID) | INTRAVENOUS | Status: DC
  Administered 2011-11-03: 20 mg via INTRAVENOUS

## 2011-11-03 MED ORDER — LORATADINE 10 MG PO TABS
10.0000 mg | ORAL_TABLET | Freq: Every day | ORAL | Status: DC
  Administered 2011-11-04 – 2011-11-07 (×4): 10 mg via ORAL
  Filled 2011-11-03 (×4): qty 1

## 2011-11-03 MED ORDER — ALBUMIN HUMAN 5 % IV SOLN
INTRAVENOUS | Status: DC | PRN
  Administered 2011-11-03 (×2): via INTRAVENOUS

## 2011-11-03 MED ORDER — INSULIN ASPART 100 UNIT/ML ~~LOC~~ SOLN: 0.0000 [IU] | SUBCUTANEOUS | Status: DC

## 2011-11-03 MED ORDER — NITROGLYCERIN IN D5W 200-5 MCG/ML-% IV SOLN
INTRAVENOUS | Status: DC | PRN
  Administered 2011-11-03: 16.6 ug/min via INTRAVENOUS

## 2011-11-03 MED ORDER — PHENYLEPHRINE HCL 10 MG/ML IJ SOLN
0.0000 ug/min | INTRAVENOUS | Status: DC
  Filled 2011-11-03: qty 2

## 2011-11-03 MED ORDER — PANTOPRAZOLE SODIUM 40 MG PO TBEC
40.0000 mg | DELAYED_RELEASE_TABLET | Freq: Every day | ORAL | Status: DC
  Administered 2011-11-05 – 2011-11-06 (×2): 40 mg via ORAL
  Filled 2011-11-03: qty 2

## 2011-11-03 MED ORDER — ROCURONIUM BROMIDE 100 MG/10ML IV SOLN
INTRAVENOUS | Status: DC | PRN
  Administered 2011-11-03: 40 mg via INTRAVENOUS
  Administered 2011-11-03: 60 mg via INTRAVENOUS

## 2011-11-03 MED ORDER — MORPHINE SULFATE 2 MG/ML IJ SOLN: 1.0000 mg | INTRAMUSCULAR | Status: DC | PRN

## 2011-11-03 MED ORDER — MOXIFLOXACIN HCL IN NACL 400 MG/250ML IV SOLN
INTRAVENOUS | Status: DC | PRN
  Administered 2011-11-03: 400 mg via INTRAVENOUS

## 2011-11-03 MED ORDER — LACTATED RINGERS IV SOLN
INTRAVENOUS | Status: DC
  Administered 2011-11-03: 60 mL via INTRAVENOUS

## 2011-11-03 MED ORDER — SODIUM CHLORIDE 0.9 % IJ SOLN
OROMUCOSAL | Status: DC | PRN
  Administered 2011-11-03 (×3): via TOPICAL

## 2011-11-03 MED ORDER — BUDESONIDE-FORMOTEROL FUMARATE 160-4.5 MCG/ACT IN AERO
2.0000 | INHALATION_SPRAY | Freq: Two times a day (BID) | RESPIRATORY_TRACT | Status: DC
  Administered 2011-11-03 – 2011-11-06 (×7): 2 via RESPIRATORY_TRACT
  Filled 2011-11-03: qty 6

## 2011-11-03 MED ORDER — LACTATED RINGERS IV SOLN
INTRAVENOUS | Status: DC | PRN
  Administered 2011-11-03 (×2): via INTRAVENOUS

## 2011-11-03 MED ORDER — MIDAZOLAM HCL 5 MG/5ML IJ SOLN
INTRAMUSCULAR | Status: DC | PRN
  Administered 2011-11-03: 2 mg via INTRAVENOUS
  Administered 2011-11-03: 1 mg via INTRAVENOUS
  Administered 2011-11-03: 5 mg via INTRAVENOUS
  Administered 2011-11-03: 1 mg via INTRAVENOUS
  Administered 2011-11-03: 3 mg via INTRAVENOUS

## 2011-11-03 MED ORDER — OXYCODONE HCL 5 MG PO TABS
5.0000 mg | ORAL_TABLET | ORAL | Status: DC | PRN
  Administered 2011-11-03 – 2011-11-06 (×10): 10 mg via ORAL
  Filled 2011-11-03 (×10): qty 2

## 2011-11-03 MED ORDER — BISACODYL 5 MG PO TBEC
10.0000 mg | DELAYED_RELEASE_TABLET | Freq: Every day | ORAL | Status: DC
  Administered 2011-11-04 – 2011-11-05 (×2): 10 mg via ORAL
  Filled 2011-11-03 (×2): qty 2

## 2011-11-03 MED ORDER — DEXMEDETOMIDINE HCL IN NACL 200 MCG/50ML IV SOLN: 0.1000 ug/kg/h | INTRAVENOUS | Status: DC

## 2011-11-03 MED ORDER — SODIUM CHLORIDE 0.9 % IV SOLN
10.0000 g | INTRAVENOUS | Status: DC | PRN
  Administered 2011-11-03: 1 g/h via INTRAVENOUS
  Administered 2011-11-03: 5 g/h via INTRAVENOUS

## 2011-11-03 MED ORDER — METOPROLOL TARTRATE 1 MG/ML IV SOLN: 2.5000 mg | INTRAVENOUS | Status: DC | PRN

## 2011-11-03 MED ORDER — POTASSIUM CHLORIDE 10 MEQ/50ML IV SOLN: 10.0000 meq | INTRAVENOUS | Status: AC

## 2011-11-03 MED ORDER — ASPIRIN EC 325 MG PO TBEC
325.0000 mg | DELAYED_RELEASE_TABLET | Freq: Every day | ORAL | Status: DC
  Administered 2011-11-04 – 2011-11-07 (×4): 325 mg via ORAL
  Filled 2011-11-03 (×4): qty 1

## 2011-11-03 MED ORDER — FENTANYL CITRATE 0.05 MG/ML IJ SOLN
INTRAMUSCULAR | Status: AC
  Filled 2011-11-03: qty 2

## 2011-11-03 MED ORDER — PROTAMINE SULFATE 10 MG/ML IV SOLN
INTRAVENOUS | Status: DC | PRN
  Administered 2011-11-03: 280 mg via INTRAVENOUS
  Administered 2011-11-03: 20 mg via INTRAVENOUS

## 2011-11-03 MED ORDER — VECURONIUM BROMIDE 10 MG IV SOLR
INTRAVENOUS | Status: DC | PRN
  Administered 2011-11-03 (×2): 4 mg via INTRAVENOUS

## 2011-11-03 MED ORDER — SODIUM CHLORIDE 0.9 % IV SOLN
INTRAVENOUS | Status: DC
  Administered 2011-11-03: 20 mL via INTRAVENOUS

## 2011-11-03 MED ORDER — MIDAZOLAM HCL 2 MG/2ML IJ SOLN
INTRAMUSCULAR | Status: AC
  Filled 2011-11-03: qty 2

## 2011-11-03 MED ORDER — SODIUM CHLORIDE 0.9 % IV SOLN: 250.0000 mL | INTRAVENOUS | Status: DC

## 2011-11-03 MED ORDER — SODIUM CHLORIDE 0.45 % IV SOLN
INTRAVENOUS | Status: DC
  Administered 2011-11-03: 20 mL via INTRAVENOUS

## 2011-11-03 MED ORDER — SODIUM CHLORIDE 0.9 % IV SOLN
INTRAVENOUS | Status: DC
  Filled 2011-11-03: qty 1

## 2011-11-03 MED ORDER — MIDAZOLAM HCL 2 MG/2ML IJ SOLN: 2.0000 mg | INTRAMUSCULAR | Status: DC | PRN

## 2011-11-03 MED ORDER — BISACODYL 10 MG RE SUPP: 10.0000 mg | Freq: Every day | RECTAL | Status: DC

## 2011-11-03 MED ORDER — ACETAMINOPHEN 160 MG/5ML PO SOLN: 650.0000 mg | ORAL | Status: AC

## 2011-11-03 MED ORDER — MORPHINE SULFATE 2 MG/ML IJ SOLN
2.0000 mg | INTRAMUSCULAR | Status: DC | PRN
  Administered 2011-11-03: 2 mg via INTRAVENOUS
  Administered 2011-11-03 – 2011-11-04 (×4): 4 mg via INTRAVENOUS
  Administered 2011-11-04: 2 mg via INTRAVENOUS
  Filled 2011-11-03 (×2): qty 2
  Filled 2011-11-03: qty 1
  Filled 2011-11-03 (×2): qty 2
  Filled 2011-11-03: qty 1

## 2011-11-03 MED ORDER — PHENYLEPHRINE HCL 10 MG/ML IJ SOLN
30.0000 ug/min | INTRAVENOUS | Status: DC
  Filled 2011-11-03: qty 2

## 2011-11-03 MED ORDER — METOPROLOL TARTRATE 12.5 MG HALF TABLET
12.5000 mg | ORAL_TABLET | Freq: Two times a day (BID) | ORAL | Status: DC
  Filled 2011-11-03 (×3): qty 1

## 2011-11-03 MED ORDER — ACETAMINOPHEN 160 MG/5ML PO SOLN
975.0000 mg | Freq: Four times a day (QID) | ORAL | Status: DC
  Filled 2011-11-03: qty 40.6

## 2011-11-03 MED ORDER — HEMOSTATIC AGENTS (NO CHARGE) OPTIME
TOPICAL | Status: DC | PRN
  Administered 2011-11-03: 1 via TOPICAL

## 2011-11-03 MED ORDER — DEXTROSE 5 % IV SOLN
INTRAVENOUS | Status: DC | PRN
  Administered 2011-11-03: 08:00:00 via INTRAVENOUS

## 2011-11-03 MED ORDER — SODIUM CHLORIDE 0.9 % IR SOLN
Status: DC | PRN
  Administered 2011-11-03: 10:00:00

## 2011-11-03 SURGICAL SUPPLY — 88 items
ADAPTER CARDIO PERF ANTE/RETRO (ADAPTER) ×2 IMPLANT
ADH SKN CLS APL DERMABOND .7 (GAUZE/BANDAGES/DRESSINGS) ×1
ADH SRG 12 PREFL SYR 3 SPRDR (MISCELLANEOUS)
ADPR PRFSN 84XANTGRD RTRGD (ADAPTER) ×1
APPLICATOR COTTON TIP 6IN STRL (MISCELLANEOUS) IMPLANT
ATTRACTOMAT 16X20 MAGNETIC DRP (DRAPES) ×2 IMPLANT
BAG DECANTER FOR FLEXI CONT (MISCELLANEOUS) ×2 IMPLANT
BLADE STERNUM SYSTEM 6 (BLADE) ×2 IMPLANT
BLADE SURG 15 STRL LF DISP TIS (BLADE) ×1 IMPLANT
BLADE SURG 15 STRL SS (BLADE) ×2
CANISTER SUCTION 2500CC (MISCELLANEOUS) ×2 IMPLANT
CANNULA GUNDRY RCSP 15FR (MISCELLANEOUS) ×2 IMPLANT
CANNULA SOFTFLOW AORTIC 7M21FR (CANNULA) ×1 IMPLANT
CANNULA SOFTFLOW AORTIC 8M24FR (CANNULA) ×1 IMPLANT
CATH CPB KIT HENDRICKSON (MISCELLANEOUS) ×2 IMPLANT
CATH HEART VENT LEFT (CATHETERS) ×1 IMPLANT
CATH ROBINSON RED A/P 18FR (CATHETERS) ×4 IMPLANT
CATH THORACIC 36FR RT ANG (CATHETERS) ×4 IMPLANT
CATH/SQUID NICHOLS JEHLE COR (CATHETERS) ×1 IMPLANT
CLIP FOGARTY SPRING 6M (CLIP) IMPLANT
CLOTH BEACON ORANGE TIMEOUT ST (SAFETY) ×2 IMPLANT
COVER SURGICAL LIGHT HANDLE (MISCELLANEOUS) ×4 IMPLANT
CRADLE DONUT ADULT HEAD (MISCELLANEOUS) ×2 IMPLANT
DERMABOND ADVANCED (GAUZE/BANDAGES/DRESSINGS) ×1
DERMABOND ADVANCED .7 DNX12 (GAUZE/BANDAGES/DRESSINGS) IMPLANT
DEVICE TROCAR PUNCTURE CLOSURE (ENDOMECHANICALS) ×1 IMPLANT
DRAPE SLUSH MACHINE 52X66 (DRAPES) IMPLANT
DRAPE SLUSH/WARMER DISC (DRAPES) IMPLANT
DRSG COVADERM 4X14 (GAUZE/BANDAGES/DRESSINGS) ×2 IMPLANT
ELECT REM PT RETURN 9FT ADLT (ELECTROSURGICAL) ×4
ELECTRODE REM PT RTRN 9FT ADLT (ELECTROSURGICAL) ×2 IMPLANT
FEMORAL VENOUS CANN RAP (CANNULA) ×1 IMPLANT
GLOVE BIO SURGEON STRL SZ 6 (GLOVE) ×4 IMPLANT
GLOVE BIO SURGEON STRL SZ 6.5 (GLOVE) ×7 IMPLANT
GLOVE BIOGEL PI IND STRL 7.0 (GLOVE) IMPLANT
GLOVE BIOGEL PI INDICATOR 7.0 (GLOVE) ×4
GLOVE EUDERMIC 7 POWDERFREE (GLOVE) ×6 IMPLANT
GOWN STRL NON-REIN LRG LVL3 (GOWN DISPOSABLE) ×13 IMPLANT
HEMOSTAT POWDER SURGIFOAM 1G (HEMOSTASIS) ×6 IMPLANT
HEMOSTAT SURGICEL 2X14 (HEMOSTASIS) ×2 IMPLANT
INSERT FOGARTY XLG (MISCELLANEOUS) IMPLANT
KIT BASIN OR (CUSTOM PROCEDURE TRAY) ×2 IMPLANT
KIT DRAINAGE VACCUM ASSIST (KITS) ×1 IMPLANT
KIT ROOM TURNOVER OR (KITS) ×2 IMPLANT
KIT SUCTION CATH 14FR (SUCTIONS) ×4 IMPLANT
LEAD PACING MYOCARDI (MISCELLANEOUS) ×1 IMPLANT
LINE VENT (MISCELLANEOUS) ×1 IMPLANT
NS IRRIG 1000ML POUR BTL (IV SOLUTION) ×13 IMPLANT
PACK OPEN HEART (CUSTOM PROCEDURE TRAY) ×2 IMPLANT
PAD ARMBOARD 7.5X6 YLW CONV (MISCELLANEOUS) ×4 IMPLANT
SET CARDIOPLEGIA MPS 5001102 (MISCELLANEOUS) ×1 IMPLANT
SPONGE GAUZE 4X4 12PLY (GAUZE/BANDAGES/DRESSINGS) ×4 IMPLANT
STOPCOCK MORSE 400PSI 3WAY (MISCELLANEOUS) ×1 IMPLANT
SUT BONE WAX W31G (SUTURE) ×2 IMPLANT
SUT ETHIBON 2 0 V 52N 30 (SUTURE) ×4 IMPLANT
SUT ETHIBON EXCEL 2-0 V-5 (SUTURE) IMPLANT
SUT ETHIBOND 2 0 SH (SUTURE) ×2
SUT ETHIBOND 2 0 SH 36X2 (SUTURE) ×1 IMPLANT
SUT ETHIBOND 2 0 V4 (SUTURE) IMPLANT
SUT ETHIBOND 2 0V4 GREEN (SUTURE) IMPLANT
SUT ETHIBOND 4 0 RB 1 (SUTURE) IMPLANT
SUT ETHIBOND V-5 VALVE (SUTURE) IMPLANT
SUT PROLENE 3 0 SH 1 (SUTURE) IMPLANT
SUT PROLENE 3 0 SH DA (SUTURE) ×2 IMPLANT
SUT PROLENE 4 0 RB 1 (SUTURE) ×18
SUT PROLENE 4-0 RB1 .5 CRCL 36 (SUTURE) IMPLANT
SUT PROLENE 5 0 C 1 36 (SUTURE) ×2 IMPLANT
SUT SILK  1 MH (SUTURE) ×3
SUT SILK 1 MH (SUTURE) ×1 IMPLANT
SUT STEEL 6MS V (SUTURE) ×1 IMPLANT
SUT STEEL SZ 6 DBL 3X14 BALL (SUTURE) IMPLANT
SUT VIC AB 1 CTX 36 (SUTURE) ×4
SUT VIC AB 1 CTX36XBRD ANBCTR (SUTURE) ×2 IMPLANT
SUT VIC AB 2-0 CTX 27 (SUTURE) IMPLANT
SUT VIC AB 3-0 X1 27 (SUTURE) IMPLANT
SUTURE E-PAK OPEN HEART (SUTURE) ×2 IMPLANT
SYR 10ML KIT SKIN ADHESIVE (MISCELLANEOUS) IMPLANT
SYSTEM SAHARA CHEST DRAIN ATS (WOUND CARE) ×2 IMPLANT
TABLE PACK (MISCELLANEOUS) ×1 IMPLANT
TAPE CLOTH SURG 4X10 WHT LF (GAUZE/BANDAGES/DRESSINGS) ×1 IMPLANT
TOWEL OR 17X24 6PK STRL BLUE (TOWEL DISPOSABLE) ×2 IMPLANT
TOWEL OR 17X26 10 PK STRL BLUE (TOWEL DISPOSABLE) ×2 IMPLANT
TRAY FOLEY IC TEMP SENS 14FR (CATHETERS) ×2 IMPLANT
TUBE FEEDING 8FR 16IN STR KANG (MISCELLANEOUS) ×2 IMPLANT
UNDERPAD 30X30 INCONTINENT (UNDERPADS AND DIAPERS) ×2 IMPLANT
VALVE MAGNA EASE AORTIC 25MM (Prosthesis & Implant Heart) ×1 IMPLANT
VENT LEFT HEART 12002 (CATHETERS) ×2
WATER STERILE IRR 1000ML POUR (IV SOLUTION) ×4 IMPLANT

## 2011-11-03 NOTE — Interval H&P Note (Signed)
History and Physical Interval Note:  11/03/2011 7:32 AM  Tim Hardy  has presented today for surgery, with the diagnosis of AORTIC STENOSIS  The various methods of treatment have been discussed with the patient and family. After consideration of risks, benefits and other options for treatment, the patient has consented to  Procedure(s) (LRB) with comments: AORTIC VALVE REPLACEMENT (AVR) (N/A) as a surgical intervention .  The patient's history has been reviewed, patient examined, no change in status, stable for surgery.  I have reviewed the patient's chart and labs.  Questions were answered to the patient's satisfaction.     Kayleen Alig C

## 2011-11-03 NOTE — Transfer of Care (Signed)
Immediate Anesthesia Transfer of Care Note  Patient: Tim Hardy  Procedure(s) Performed: Procedure(s) (LRB) with comments: AORTIC VALVE REPLACEMENT (AVR) (N/A) - Partial Sternotomy STERNOTOMY (N/A) - Partial  Patient Location: PACU and SICU  Anesthesia Type: General  Level of Consciousness: sedated and unresponsive  Airway & Oxygen Therapy: Patient remains intubated per anesthesia plan and Patient placed on Ventilator (see vital sign flow sheet for setting)  Post-op Assessment: Report given to PACU RN and Post -op Vital signs reviewed and stable  Post vital signs: Reviewed and stable  Complications: No apparent anesthesia complications

## 2011-11-03 NOTE — Progress Notes (Signed)
  Echocardiogram Echocardiogram Transesophageal has been performed.  Tim Hardy 11/03/2011, 8:49 AM

## 2011-11-03 NOTE — Anesthesia Postprocedure Evaluation (Signed)
  Anesthesia Post-op Note  Patient: Tim Hardy  Procedure(s) Performed: Procedure(s) (LRB) with comments: AORTIC VALVE REPLACEMENT (AVR) (N/A) - Partial Sternotomy STERNOTOMY (N/A) - Partial  Patient Location: SICU  Anesthesia Type: General  Level of Consciousness: sedated and Patient remains intubated per anesthesia plan  Airway and Oxygen Therapy: Patient remains intubated per anesthesia plan and Patient placed on Ventilator (see vital sign flow sheet for setting)  Post-op Pain: none  Post-op Assessment: Post-op Vital signs reviewed  Post-op Vital Signs: Reviewed  Complications: No apparent anesthesia complications

## 2011-11-03 NOTE — H&P (View-Only) (Signed)
PCP is Tim Mountain, MD Referring Provider is Tim Schultz, MD  Chief Complaint  Patient presents with  . Aortic Stenosis    eval and treat...ECHO/CATH    HPI: 71 year old gentleman presents with chief complaint of shortness of breath with exertion.  [name] is a 71 year old gentleman with known aortic stenosis. He recently has been having shortness of breath with exertion. He first noticed a couple of years ago but it did pretty significant exertion because the shortness of breath. He's had allergies for 50 years and asthma for a long time and he first attributed his symptoms to those problems. Over the last several months however he's noted that he gets severely short of breath with only mild exertion, such as walking 100 -150 feet. He also feels very tired and fatigued when he exerts himself. He also notes shortness of breath and fatigue when he bends over to try to lift anything. He had an echocardiogram in September of 2012 which showed moderate to severe aortic stenosis, but the valve was not visualized well. He went to Dr. Valentina Hardy because of progressive shortness of breath and fatigue and was referred to Dr. Anne Hardy. An echocardiogram showed severe aortic stenosis with a V-max of 494 cm/s, peak gradient of 98 mm mercury, mean gradient of 64 mm mercury, and a calculated valve area of 0.5 cm. He then underwent cardiac catheterization. The aortic valve could not be crossed. His right-sided pressures were normal and his cardiac index was 2.4 by Fick and 1.9 by thermodilution. 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 BPH (benign prostatic hyperplasia)   . GERD (gastroesophageal reflux disease)   . Perennial allergic rhinitis   . Aortic valve stenosis, moderate     LVH  . Diastolic dysfunction   . Asthma   . Colon polyps   . Colon, diverticulosis   . Hiatal hernia   . ED (erectile  dysfunction)     No past surgical history on file.  Family History  Problem Relation Age of Onset  . Cancer Father     lung and colon  . Hypertension Father   . Diabetes Sister   . Diabetes Son     Social History History  Substance Use Topics  . Smoking status: Former Smoker    Types: Cigarettes    Start date: 02/04/1971  . Smokeless tobacco: Current User    Types: Chew  . Alcohol Use: No    Current Outpatient Prescriptions  Medication Sig Dispense Refill  . acetaminophen-codeine 120-12 MG/5ML solution Take 10 mLs by mouth every 6 (six) hours as needed.       Marland Kitchen albuterol (PROVENTIL HFA;VENTOLIN HFA) 108 (90 BASE) MCG/ACT inhaler Inhale 2 puffs into the lungs every 6 (six) hours as needed.      Marland Kitchen aspirin 81 MG tablet Take 81 mg by mouth daily.      . budesonide-formoterol (SYMBICORT) 160-4.5 MCG/ACT inhaler Inhale 2 puffs into the lungs 2 (two) times daily.      . cetirizine (ZYRTEC) 10 MG tablet Take 10 mg by mouth daily.      . fluticasone (FLONASE) 50 MCG/ACT nasal spray Place 2 sprays into the nose daily.      Marland Kitchen NEXIUM 40 MG capsule Take 40 mg by mouth daily before breakfast.       . sildenafil (VIAGRA) 50 MG tablet Take 50 mg by mouth daily as needed.      Marland Kitchen  Tamsulosin HCl (FLOMAX) 0.4 MG CAPS Take 0.4 mg by mouth daily after supper.         Allergies  Allergen Reactions  . Penicillins Rash    Review of Systems  Constitutional: Positive for activity change (decreased energy).  HENT:       Saw dentist 5/13- no issues  Respiratory: Positive for shortness of breath (with mild exertion, increasing frequency and severity) and wheezing.   Cardiovascular: Negative for palpitations and leg swelling.  Gastrointestinal:       Reflux  Genitourinary: Negative.   Neurological: Negative.   Hematological: Bruises/bleeds easily (on aspirin).  Psychiatric/Behavioral: Negative.   All other systems reviewed and are negative.    BP 138/88  Pulse 94  Resp 16  Ht 6\' 1"   (1.854 m)  Wt 236 lb (107.049 kg)  BMI 31.14 kg/m2  SpO2 93% Physical Exam  Constitutional: He is oriented to person, place, and time. He appears well-developed and well-nourished. No distress.  HENT:  Head: Normocephalic and atraumatic.  Eyes: EOM are normal. Pupils are equal, round, and reactive to light.  Neck: No JVD present. No thyromegaly present.  Cardiovascular: Normal rate and regular rhythm.   Murmur (3/6 harsh murmur heard throughout precordium) heard. Pulmonary/Chest: Effort normal and breath sounds normal. He has no wheezes. He has no rales.  Abdominal: Soft. There is no tenderness.       Umbilical hernia  Musculoskeletal: Normal range of motion. He exhibits no edema.  Lymphadenopathy:    He has no cervical adenopathy.  Neurological: He is alert and oriented to person, place, and time. No cranial nerve deficit.       No motor deficit  Skin: Skin is warm and dry.  Psychiatric: He has a normal mood and affect.     Diagnostic Tests: Echocardiogram 10/15/2011 and cardiac catheterization 10/23/2011 as previously noted  Impression: 71 year old gentleman with symptomatic, critical aortic stenosis. Aortic valve replacement is indicated for survival benefit as well as relief of symptoms.  I discussed with Tim Hardy and his friend who accompanied him the valve options. He understands the major categories of prosthetic valves include mechanical and tissue valves. We discussed the relative advantages and disadvantages of each. Given his age of 35 a tissue valve would be the valve of choice. He agrees to tissue valve replacement. He had questions regarding newer percutaneous approaches. I explained to him that he is not a candidate for that procedure as he is a good candidate for conventional valve replacement.  I discussed in detail with them the indications, risks, benefits, and alternatives. We talked about the surgical approach, use of right coronary bypass, the incisions to  be used, need for general anesthesia, expected hospital stay and overall recovery. They understand the risks of aortic valve replacement include, but are not limited to, death, MI, stroke, DVT, PE, bleeding, possible need for transfusion, infection, complete heart block requiring permanent pacemaker, cardiac arrhythmias, other organ system dysfunction including respiratory, renal, or GI complications. They also understand there is a possibility of unforeseeable complications. He understands and accepts these risks and wishes to proceed.  Plan: Aortic valve replacement be a partial sternotomy on Monday, 11/03/2011

## 2011-11-03 NOTE — Anesthesia Preprocedure Evaluation (Addendum)
Anesthesia Evaluation  Patient identified by MRN, date of birth, ID band Patient awake    Reviewed: Allergy & Precautions, H&P , NPO status , Patient's Chart, lab work & pertinent test results  Airway Mallampati: III TM Distance: >3 FB   Mouth opening: Limited Mouth Opening  Dental  (+) Teeth Intact, Caps and Dental Advisory Given   Pulmonary  breath sounds clear to auscultation  Pulmonary exam normal       Cardiovascular Exercise Tolerance: Poor hypertension, Pt. on medications + Valvular Problems/Murmurs AS Rhythm:Regular Rate:Normal + Systolic murmurs and + Carotid Bruit- Diastolic murmurs    Neuro/Psych PSYCHIATRIC DISORDERS Anxiety    GI/Hepatic hiatal hernia, GERD-  Medicated and Controlled,  Endo/Other    Renal/GU      Musculoskeletal  (+) Arthritis -, Osteoarthritis,    Abdominal   Peds  Hematology   Anesthesia Other Findings Crowns are in upper lateral position.  Reproductive/Obstetrics                         Anesthesia Physical Anesthesia Plan  ASA: IV  Anesthesia Plan: General   Post-op Pain Management:    Induction: Intravenous  Airway Management Planned: Oral ETT  Additional Equipment: TEE, Ultrasound Guidance Line Placement, Arterial line, PA Cath and CVP  Intra-op Plan:   Post-operative Plan: Post-operative intubation/ventilation  Informed Consent: I have reviewed the patients History and Physical, chart, labs and discussed the procedure including the risks, benefits and alternatives for the proposed anesthesia with the patient or authorized representative who has indicated his/her understanding and acceptance.   Dental advisory given  Plan Discussed with: CRNA, Anesthesiologist and Surgeon  Anesthesia Plan Comments:        Anesthesia Quick Evaluation

## 2011-11-03 NOTE — Progress Notes (Signed)
Patient ID: Tim Hardy, male   DOB: 21-Mar-1940, 71 y.o.   MRN: [id number]                   [address]            [name] [address]          [phone]     Day of Surgery Procedure(s) (LRB): AORTIC VALVE REPLACEMENT (AVR) (N/A) STERNOTOMY (N/A)  Total Length of Stay:  LOS: 0 days  BP 100/60  Pulse 80  Temp 99.7 F (37.6 C) (Core (Comment))  Resp 18  Wt 235 lb (106.595 kg)  SpO2 100%     . sodium chloride 20 mL (11/03/11 1415)  . sodium chloride 20 mL (11/03/11 1330)  . sodium chloride    . dexmedetomidine Stopped (11/03/11 1530)  . lactated ringers 60 mL (11/03/11 1330)  . nitroGLYCERIN    . phenylephrine (NEO-SYNEPHRINE) Adult infusion Stopped (11/03/11 1600)  . DISCONTD: insulin (NOVOLIN-R) infusion Stopped (11/03/11 1530)     Lab Results  Component Value Date   WBC 12.2* 11/03/2011   HGB 11.6* 11/03/2011   HCT 34.0* 11/03/2011   PLT 122* 11/03/2011   GLUCOSE 115* 11/03/2011   ALT 22 10/30/2011   AST 20 10/30/2011   NA 140 11/03/2011   K 4.6 11/03/2011   CL 108 11/03/2011   CREATININE 1.10 11/03/2011   BUN 15 11/03/2011   CO2 22 10/30/2011   INR 1.46 11/03/2011   HGBA1C 5.5 10/30/2011   Stable off vent  Not bleeding  Delight Ovens MD  Beeper [phone] Office [phone] 11/03/2011 7:56 PM

## 2011-11-03 NOTE — Brief Op Note (Addendum)
11/03/2011  11:35 AM  PATIENT:  Jodell Cipro  71 y.o. male  PRE-OPERATIVE DIAGNOSIS:  AORTIC STENOSIS  POST-OPERATIVE DIAGNOSIS:  Aortic Stenosis  PROCEDURE:   AORTIC VALVE REPLACEMENT (25 mm Magna Ease pericardial tissue valve) via Partial sternotomy  SURGEON:  Surgeon(s): Loreli Slot, MD  ASSISTANT: Coral Ceo, PA-C  ANESTHESIA:   general  PATIENT CONDITION:  ICU - intubated and hemodynamically stable.  PRE-OPERATIVE WEIGHT: 106 kg  Findings- severe AS, heavily calcified bicuspid valve  XC= 119 min  CPB= 160 min

## 2011-11-03 NOTE — Procedures (Signed)
Extubation Procedure Note  Patient Details:   Name: Tim Hardy DOB: December 03, 1940 MRN: [id number]   Airway Documentation:     Evaluation  O2 sats: stable throughout Complications: No apparent complications Patient did tolerate procedure well. Bilateral Breath Sounds: Clear Suctioning: Airway Yes  Pt achieved 1.6L VC, -20 NIF and was positive for cuff leak. Pt extubated to 5lpm Richardson. Pt achieved around on IS x 3. PT resting comfortably at this time. RT will continue to monitor.   Parke Poisson 11/03/2011, 6:34 PM

## 2011-11-03 NOTE — Preoperative (Signed)
Beta Blockers   Reason not to administer Beta Blockers:Not Applicable 

## 2011-11-03 NOTE — Anesthesia Procedure Notes (Signed)
Procedure Name: Intubation Date/Time: 11/03/2011 7:50 AM Performed by: Tyrone Nine Pre-anesthesia Checklist: Timeout performed, Patient identified, Emergency Drugs available, Suction available and Patient being monitored Patient Re-evaluated:Patient Re-evaluated prior to inductionOxygen Delivery Method: Circle system utilized Preoxygenation: Pre-oxygenation with 100% oxygen Intubation Type: IV induction Ventilation: Mask ventilation without difficulty Laryngoscope Size: Mac and 3 Grade View: Grade II Tube type: Oral Tube size: 8.0 mm Number of attempts: 1 Airway Equipment and Method: Stylet Placement Confirmation: ETT inserted through vocal cords under direct vision,  breath sounds checked- equal and bilateral and positive ETCO2 Secured at: 23 cm Tube secured with: Tape Dental Injury: Teeth and Oropharynx as per pre-operative assessment

## 2011-11-04 ENCOUNTER — Encounter (HOSPITAL_COMMUNITY): Payer: Self-pay | Admitting: Thoracic Surgery (Cardiothoracic Vascular Surgery)

## 2011-11-04 ENCOUNTER — Inpatient Hospital Stay (HOSPITAL_COMMUNITY): Payer: Medicare Other

## 2011-11-04 LAB — GLUCOSE, CAPILLARY
Glucose-Capillary: 94 mg/dL (ref 70–99)
Glucose-Capillary: 117 mg/dL — ABNORMAL HIGH (ref 70–99)
Glucose-Capillary: 132 mg/dL — ABNORMAL HIGH (ref 70–99)

## 2011-11-04 LAB — CBC
MCH: 31.1 pg (ref 26.0–34.0)
MCHC: 33.5 g/dL (ref 30.0–36.0)
Platelets: 140 10*3/uL — ABNORMAL LOW (ref 150–400)
RBC: 3.76 MIL/uL — ABNORMAL LOW (ref 4.22–5.81)

## 2011-11-04 LAB — MAGNESIUM: Magnesium: 2.5 mg/dL (ref 1.5–2.5)

## 2011-11-04 LAB — BASIC METABOLIC PANEL
Calcium: 8.4 mg/dL (ref 8.4–10.5)
GFR calc non Af Amer: 73 mL/min — ABNORMAL LOW (ref >90)
Potassium: 4.2 mEq/L (ref 3.5–5.1)
Sodium: 137 mEq/L (ref 135–145)

## 2011-11-04 MED ORDER — SODIUM CHLORIDE 0.9 % IJ SOLN: 3.0000 mL | INTRAMUSCULAR | Status: DC | PRN

## 2011-11-04 MED ORDER — INSULIN GLARGINE 100 UNIT/ML ~~LOC~~ SOLN
30.0000 [IU] | Freq: Every day | SUBCUTANEOUS | Status: DC
  Administered 2011-11-04: 30 [IU] via SUBCUTANEOUS

## 2011-11-04 MED ORDER — METOPROLOL TARTRATE 25 MG/10 ML ORAL SUSPENSION
25.0000 mg | Freq: Two times a day (BID) | ORAL | Status: DC
  Filled 2011-11-04 (×3): qty 10

## 2011-11-04 MED ORDER — ALPRAZOLAM 0.25 MG PO TABS: 0.2500 mg | ORAL_TABLET | Freq: Four times a day (QID) | ORAL | Status: DC | PRN

## 2011-11-04 MED ORDER — ZOLPIDEM TARTRATE 5 MG PO TABS
5.0000 mg | ORAL_TABLET | Freq: Every evening | ORAL | Status: DC | PRN
  Administered 2011-11-04: 5 mg via ORAL
  Filled 2011-11-04: qty 1

## 2011-11-04 MED ORDER — MAGNESIUM HYDROXIDE 400 MG/5ML PO SUSP: 30.0000 mL | Freq: Every day | ORAL | Status: DC | PRN

## 2011-11-04 MED ORDER — ALBUTEROL SULFATE HFA 108 (90 BASE) MCG/ACT IN AERS
2.0000 | INHALATION_SPRAY | Freq: Four times a day (QID) | RESPIRATORY_TRACT | Status: DC | PRN
  Administered 2011-11-06: 2 via RESPIRATORY_TRACT
  Filled 2011-11-04 (×2): qty 6.7

## 2011-11-04 MED ORDER — SODIUM CHLORIDE 0.9 % IJ SOLN
3.0000 mL | Freq: Two times a day (BID) | INTRAMUSCULAR | Status: DC
  Administered 2011-11-04 – 2011-11-06 (×4): 3 mL via INTRAVENOUS

## 2011-11-04 MED ORDER — GUAIFENESIN-DM 100-10 MG/5ML PO SYRP: 15.0000 mL | ORAL_SOLUTION | ORAL | Status: DC | PRN

## 2011-11-04 MED ORDER — FUROSEMIDE 10 MG/ML IJ SOLN
40.0000 mg | Freq: Once | INTRAMUSCULAR | Status: AC
  Administered 2011-11-04: 40 mg via INTRAVENOUS

## 2011-11-04 MED ORDER — SODIUM CHLORIDE 0.9 % IV SOLN: 250.0000 mL | INTRAVENOUS | Status: DC | PRN

## 2011-11-04 MED ORDER — METOPROLOL TARTRATE 25 MG PO TABS
25.0000 mg | ORAL_TABLET | Freq: Two times a day (BID) | ORAL | Status: DC
  Administered 2011-11-04 – 2011-11-07 (×6): 25 mg via ORAL
  Filled 2011-11-04 (×8): qty 1

## 2011-11-04 MED ORDER — INSULIN ASPART 100 UNIT/ML ~~LOC~~ SOLN
0.0000 [IU] | SUBCUTANEOUS | Status: DC
  Administered 2011-11-04 – 2011-11-05 (×3): 2 [IU] via SUBCUTANEOUS

## 2011-11-04 MED ORDER — ENOXAPARIN SODIUM 40 MG/0.4ML ~~LOC~~ SOLN
40.0000 mg | SUBCUTANEOUS | Status: DC
  Administered 2011-11-04 – 2011-11-07 (×4): 40 mg via SUBCUTANEOUS
  Filled 2011-11-04 (×6): qty 0.4

## 2011-11-04 MED ORDER — POTASSIUM CHLORIDE 10 MEQ/50ML IV SOLN
10.0000 meq | INTRAVENOUS | Status: AC
  Administered 2011-11-04 (×4): 10 meq via INTRAVENOUS

## 2011-11-04 MED ORDER — MOVING RIGHT ALONG BOOK
Freq: Once | Status: AC
  Administered 2011-11-04: 14:00:00
  Filled 2011-11-04: qty 1

## 2011-11-04 MED ORDER — ALUM & MAG HYDROXIDE-SIMETH 200-200-20 MG/5ML PO SUSP: 15.0000 mL | ORAL | Status: DC | PRN

## 2011-11-04 NOTE — Op Note (Signed)
Tim Hardy NO.:  [id number]  MEDICAL RECORD NO.:  [id number]  LOCATION:  2312                         FACILITY:  [facility]  PHYSICIAN:  Tim Hardy, M.D.DATE OF BIRTH:  May 20, 1940  DATE OF PROCEDURE:  11/03/2011 DATE OF DISCHARGE:                              OPERATIVE REPORT   PREOPERATIVE DIAGNOSIS:  Critical aortic stenosis.  POSTOPERATIVE DIAGNOSIS:  Critical aortic stenosis, calcified stenotic bicuspid valve.  PROCEDURE:  Partial sternotomy (L through right 4th interspace), aortic valve replacement with 25 mm [facility] Ease Bovine pericardial valve (model number [id number] TFX, serial number [id number]).  SURGEON:  Tim Hardy, M.D.  ASSISTANT:  Coral Ceo, P.A.  ANESTHESIA:  General.  FINDINGS:  Prebypass transesophageal echocardiography showed concentric left ventricular hypertrophy, critical aortic stenosis with heavily calcified valve.  Intraoperative findings; heavily calcified bicuspid valve with 180-degree commissures, heavy annular calcification. Postbypass transesophageal echocardiography revealed good function of the prosthetic valve with no perivalvular leaks.  CLINICAL NOTE:  Tim Hardy is a 71 year old gentleman with known aortic stenosis who has recently been having progressive shortness of breath with exertion.  An echocardiogram showed severe aortic stenosis with a peak gradient of 98 mmHg and a mean gradient of 64 mmHg with a calculated valve area of 0.5 cm2.  At cardiac catheterization, his aortic valve could not be crossed, he had some mild to moderate coronary disease, but no flow-limiting stenoses.  He was advised to undergo aortic valve replacement.  After discussing various options with the patient, he wished to proceed with partial sternotomy.  The indications, risks, benefits, and alternatives were discussed in detail with the patient and are outlined in the consultation note.  He understood  and accepted the risks and agreed to proceed.  We did discuss valve options including tissue and mechanical valves, given his age, tissue valve was preferred option and he agreed with that decision as well.  OPERATIVE NOTE:  Tim Hardy was brought to the preoperative holding area on November 03, 2011.  There the Anesthesia Service under the direction of Dr. Sheldon Silvan placed a Swan-Ganz catheter and arterial blood pressure monitoring line.  He was taken to the operating room, anesthetized, and intubated.  Intravenous antibiotics were administered. A Foley catheter was placed.  The chest, abdomen, and legs were prepped and draped in usual sterile fashion.  Transesophageal echocardiography was performed.  Please refer to Dr. Ivin Booty' separately dictated note for full details.  It did show a heavily calcified aortic valve with critical aortic stenosis and severe left ventricular hypertrophy.  An upper chest incision was made extending from the sternal notch to approximately half the distance to the xiphoid process.  This was carried through the skin and subcutaneous tissue.  Hemostasis was achieved with electrocautery.  An L-shaped partial sternotomy was performed with the upper sternum being divided and then the L being created through the right 4th intercostal space.  Hemostasis was achieved. Retractor was placed and gradually opened during the course of the procedure.  The pericardium was incised and the ascending aorta was identified.  It was palpated.  There was no evidence of atherosclerotic disease.  The patient was heparinized.  The right femoral vein was  accessed using the Seldinger technique.  The vein was accessed on the first pass.  The wire passed easily and was seen by transesophageal echocardiography to be in the right atrium.  The tract was then sequentially dilated and the femoral venous cannula was advanced until it was visible within the right atrium.  The inner  cannula was removed and the venous cannula was connected to the bypass circuit and secured with silk sutures.  After confirming adequate anticoagulation with ACT measurement, the aortic cannula was placed via concentric 2-0 Ethibond pledgeted pursestring sutures.  Cardiopulmonary bypass was instituted and the patient was cooled to 28 degrees Celsius.  A left ventricular vent was placed via pursestring suture in the right superior pulmonary vein.  An attempt was made to place a retrograde cardioplegic cannula and this was initially felt to be successful with the cannula palpated in the coronary sinus, and a wedge tracing with balloon inflation.  An antegrade cardioplegic cannula was placed in the ascending aorta.  The aorta was crossclamped.  The left ventricle and aortic root vents were turned off.  After initially decompressing, cold antegrade cardioplegia was administered via the ascending aortic cardioplegia cannula; however, there was poor root pressure and decision was made to proceed with antegrade cannulation via hand-held cannulas.  An aortotomy was performed.  The left main coronary ostia was identified and cardioplegia was administered directly into it until the septal temperature reached 10 degrees Celsius.  Approximately 1 L of cardioplegia was given in this fashion.  Another 500 mL of cardioplegia was administered directly into the right coronary ostia.  There was a good diastolic arrest.  An attempt to administer retrograde cardioplegia resulted in no pressure tracing and the cannula could be palpated within the right atrium.  Decision was made to abandon the retrograde and use hand-held antegrade cardioplegia directly in the coronary ostia throughout the remainder of the procedure.  The aortic valve was inspected.  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  Then the annulus was heavily calcified.  The leaflets were oriented anteriorly and posteriorly  with 180 degree commissures.  The leaflets were excised.  Care was taken to contain all calcific debris.  The annulus then was debrided, this took a considerable period of time because of the heavy and thick annular calcification.  After debriding the annulus, it was copiously irrigated with iced saline.  At this point, it was time to re-administer cardioplegia via both the left main and right coronary ostia.  The annulus was then sized for a 25 mm Magna Ease Bovine pericardial valve.  This was prepared per manufacturer's recommendations.  2-0 Ethibond horizontal mattress sutures were placed circumferentially around the annulus with subannular Teflon felt pledgets.  Fourteen sutures were utilized.  After placing the sutures, additional cardioplegia was administered down the coronary ostia.  The sutures then were passed through the sewing ring of the valve.  The valve was lowered into place and the sutures were sequentially tied.  The leaflets were periodically opened to ensure that the valve was well seated.  There was no impingement or obstruction of the coronary ostia.  The annulus was gently probed with a fine-tip right-angle clamp and no gaps were noted. Systemic rewarming was begun.  Additional cardioplegia was administered down the coronary ostia.  The aortotomy then was closed in 2 layers with a running 4-0 Prolene horizontal mattress suture followed by running 4-0 Prolene simple suture.  De-airing was performed after the horizontal mattress suture, and prior  to completing closure of the aortotomy.  It should be noted that carbon dioxide was insufflated into the operative field during the entire procedure.  After completion of the aortotomy closure, the patient was placed in a steep Trendelenburg position.  Additional de- airing maneuvers were performed with the aortic root vent on.  Lidocaine was administered and the aortic crossclamp was removed.  Total crossclamp time was 119  minutes.  The patient initially was in heart block, but resumed sinus rhythm.  He did not require defibrillation.  While rewarming was completed, the aortotomy was inspected for hemostasis.  Additional pledgeted 4-0 Prolene horizontal mattress sutures were used as necessary to achieve good hemostasis.  A bipolar pacing wire was placed on the inferior aspect of the right atrium and brought through the skin and secured with a silk suture.  Epicardial pacing wires were likewise placed on the right atrium.  Additional de- airing was performed as the left ventricular vent was removed.  When the patient had rewarmed to a core temperature of 37 degrees Celsius, he was weaned from cardiopulmonary bypass on the first attempt.  He was in sinus rhythm and did not require inotropic support.  The total bypass time was 160 minutes.  The initial cardiac index was greater than 2 L/minute/meter squared.  The patient remained hemodynamically stable throughout the post bypass.  Postbypass transesophageal echocardiography revealed good function of the prosthetic valve with no perivalvular leaks, and there was overall preserved left ventricular function again with concentric left ventricular hypertrophy.  A test dose of protamine was administered and was well tolerated.  The femoral venous cannula was removed and pressure was held on the site for 30 minutes.  The aortic cannula was removed as was the cardioplegic cannula.  There was good hemostasis at both sites.  The remainder of the protamine was administered without incident.  Chest was irrigated with warm saline.  Hemostasis was achieved.  Single mediastinal chest tube was brought through an incision inferior to the xiphoid and through the pericardium into the mediastinum.  The pericardium was reapproximated over the ascending aorta and base of the heart with interrupted 3-0 silk sutures.  The sternum was closed with combination of single and  double heavy gauge stainless steel wires.  The pectoralis fascia, subcutaneous tissue, and skin were closed in standard fashion.  All sponge, needle, and instrument counts were correct at the end of the procedure and the patient was taken from the operating room to the surgical intensive care unit in good condition.     Tim Decent Dorris Hardy, M.D.     SCH/MEDQ  D:  11/03/2011  T:  11/04/2011  Job:  [id number]

## 2011-11-04 NOTE — Plan of Care (Signed)
Problem: Phase III Progression Outcomes Goal: Time patient transferred to PCTU/Telemetry POD Outcome: Completed/Met Date Met:  11/04/11 Transferred to 2016 via wheelchair with propak.  Tolerated transfer well.  Parked and walked to room without difficulty.  Vital signs stable.

## 2011-11-04 NOTE — Care Management Note (Unsigned)
    Page 1 of 1   11/04/2011     1:39:01 PM   CARE MANAGEMENT NOTE 11/04/2011  Patient:  Tim Hardy, Tim Hardy   Account Number:  [id number]  Date Initiated:  11/04/2011  Documentation initiated by:  Macel Yearsley  Subjective/Objective Assessment:   PT S/P AVR ON 11/03/11.  PTA, PT INDEPENDENT, LIVES WITH GIRLFRIEND.     Action/Plan:   MET WITH PT AND GIRLFRIEND TO DISCUSS DC PLANS.  GIRLFRIEND TO PROVIDE 24HR CARE AT DISCHARGE.  WILL FOLLOW FOR HOME NEEDS AS PT PROGRESSES.   Anticipated DC Date:  11/08/2011   Anticipated DC Plan:  HOME W HOME HEALTH SERVICES      DC Planning Services  CM consult      Choice offered to / List presented to:             Status of service:  In process, will continue to follow Medicare Important Message given?   (If response is "NO", the following Medicare IM given date fields will be blank) Date Medicare IM given:   Date Additional Medicare IM given:    Discharge Disposition:    Per UR Regulation:  Reviewed for med. necessity/level of care/duration of stay  If discussed at Long Length of Stay Meetings, dates discussed:    Comments:

## 2011-11-04 NOTE — Progress Notes (Signed)
Courtesy visit-appreciate excellent care. Doing well. Very appreciative. Postop aortic valve replacement, pericardial valve. Aspirin.

## 2011-11-04 NOTE — Progress Notes (Signed)
1 Day Post-Op Procedure(s) (LRB): AORTIC VALVE REPLACEMENT (AVR) (N/A) STERNOTOMY (N/A) Subjective: Some incisional pain Denies nausea  Objective: Vital signs in last 24 hours: Temp:  [97.3 F (36.3 C)-100.2 F (37.9 C)] 99.3 F (37.4 C) (10/01 0705) Pulse Rate:  [54-84] 81  (10/01 0705) Cardiac Rhythm:  [-] Atrial paced (10/01 0600) Resp:  [5-26] 20  (10/01 0705) BP: (76-126)/(47-74) 126/67 mmHg (10/01 0705) SpO2:  [92 %-100 %] 97 % (10/01 0705) Arterial Line BP: (100-142)/(51-69) 142/66 mmHg (10/01 0705) FiO2 (%):  [40 %-50 %] 40 % (09/30 1730) Weight:  [238 lb 1.6 oz (108 kg)] 238 lb 1.6 oz (108 kg) (10/01 0500)  Hemodynamic parameters for last 24 hours: PAP: (35-54)/(18-28) 49/24 mmHg CO:  [4.1 L/min-6.4 L/min] 5.5 L/min CI:  [1.8 L/min/m2-2.8 L/min/m2] 2.4 L/min/m2  Intake/Output from previous day: 09/30 0701 - 10/01 0700 In: 7402.9 [P.O.:870; I.V.:4647.9; Blood:600; IV Piggyback:1285] Out: 6030 [Urine:4260; Blood:1500; Chest Tube:270] Intake/Output this shift:    General appearance: alert and no distress Neurologic: intact Heart: RRR, no murmur, + rub Lungs: diminished breath sounds bibasilar Abdomen: mildly distended, nontender  Lab Results:  Basename 11/04/11 0420 11/03/11 1916 11/03/11 1910  WBC 11.4* -- 12.2*  HGB 11.7* 11.6* --  HCT 34.9* 34.0* --  PLT 140* -- 122*   BMET:  Basename 11/04/11 0420 11/03/11 1916  NA 137 140  K 4.2 4.6  CL 104 108  CO2 24 --  GLUCOSE 125* 115*  BUN 18 15  CREATININE 1.01 1.10  CALCIUM 8.4 --    PT/INR:  Basename 11/03/11 1319  LABPROT 17.3*  INR 1.46   ABG    Component Value Date/Time   PHART 7.335* 11/03/2011 1920   HCO3 22.7 11/03/2011 1920   TCO2 24 11/03/2011 1920   ACIDBASEDEF 3.0* 11/03/2011 1920   O2SAT 97.0 11/03/2011 1920   CBG (last 3)   Basename 11/04/11 0722 11/04/11 0349 11/04/11 0003  GLUCAP 117* 103* 108*    Assessment/Plan: S/P Procedure(s) (LRB): AORTIC VALVE REPLACEMENT (AVR)  (N/A) STERNOTOMY (N/A) Plan for transfer to step-down: see transfer orders POD # 1 AVR  CV- stable, good hemodynamics- d/c swan  Pericardial valve- will use ASA, add coumadin if he has a fib  RESP- pulmonary hygiene  RENAL- lytes, creatinine OK- diurese  Anemia secondary to ABL- expected, mild, follow  CBG well controlled, lantus, ssi  D/C CT  Mobilize  Transfer to PTCU if bed available   LOS: 1 day    Kert Shackett C 11/04/2011

## 2011-11-05 ENCOUNTER — Inpatient Hospital Stay (HOSPITAL_COMMUNITY): Payer: Medicare Other

## 2011-11-05 LAB — CBC
HCT: 33 % — ABNORMAL LOW (ref 39.0–52.0)
Hemoglobin: 11.2 g/dL — ABNORMAL LOW (ref 13.0–17.0)
MCH: 31.4 pg (ref 26.0–34.0)
MCV: 92.4 fL (ref 78.0–100.0)
RBC: 3.57 MIL/uL — ABNORMAL LOW (ref 4.22–5.81)
WBC: 13.7 10*3/uL — ABNORMAL HIGH (ref 4.0–10.5)

## 2011-11-05 LAB — BASIC METABOLIC PANEL
CO2: 26 mEq/L (ref 19–32)
Calcium: 8.6 mg/dL (ref 8.4–10.5)
Chloride: 99 mEq/L (ref 96–112)
Glucose, Bld: 115 mg/dL — ABNORMAL HIGH (ref 70–99)
Potassium: 4.2 mEq/L (ref 3.5–5.1)
Sodium: 133 mEq/L — ABNORMAL LOW (ref 135–145)

## 2011-11-05 MED ORDER — FUROSEMIDE 10 MG/ML IJ SOLN
40.0000 mg | Freq: Once | INTRAMUSCULAR | Status: AC
  Administered 2011-11-05: 40 mg via INTRAVENOUS

## 2011-11-05 MED ORDER — POTASSIUM CHLORIDE CRYS ER 20 MEQ PO TBCR
20.0000 meq | EXTENDED_RELEASE_TABLET | Freq: Once | ORAL | Status: AC
  Administered 2011-11-05: 20 meq via ORAL
  Filled 2011-11-05: qty 1

## 2011-11-05 MED FILL — Heparin Sodium (Porcine) Inj 1000 Unit/ML: INTRAMUSCULAR | Qty: 10 | Status: AC

## 2011-11-05 MED FILL — Heparin Sodium (Porcine) Inj 1000 Unit/ML: INTRAMUSCULAR | Qty: 30 | Status: AC

## 2011-11-05 MED FILL — Electrolyte-R (PH 7.4) Solution: INTRAVENOUS | Qty: 4000 | Status: AC

## 2011-11-05 MED FILL — Lidocaine HCl IV Inj 20 MG/ML: INTRAVENOUS | Qty: 5 | Status: AC

## 2011-11-05 MED FILL — Sodium Bicarbonate IV Soln 8.4%: INTRAVENOUS | Qty: 50 | Status: AC

## 2011-11-05 MED FILL — Mannitol IV Soln 20%: INTRAVENOUS | Qty: 500 | Status: AC

## 2011-11-05 MED FILL — Sodium Chloride IV Soln 0.9%: INTRAVENOUS | Qty: 1000 | Status: AC

## 2011-11-05 MED FILL — Sodium Chloride Irrigation Soln 0.9%: Qty: 3000 | Status: AC

## 2011-11-05 NOTE — Progress Notes (Signed)
CARDIAC REHAB PHASE I   PRE:  Rate/Rhythm: 80 SR BBB PAC's  BP:  Supine: 114/67  Sitting:   Standing:    SaO2: 94 RA  MODE:  Ambulation: 150 ft   POST:  Rate/Rhythem: 98 SR BBB  BP:  Supine:   Sitting: 129/64  Standing:    SaO2: 96 RA 1050-1130 Assisted X 1 and used walker to ambulate. Pt c/o of being sleepy, a little lightheaded and nauseated with walking. VS stable. Pt to recliner after walk with call light in reach and wife present.Encouraged use of IS, flutter value and two more walks today.  Beatrix Fetters

## 2011-11-05 NOTE — Progress Notes (Addendum)
[address]            [name] [address]          [phone]      2 Days Post-Op Procedure(s) (LRB): AORTIC VALVE REPLACEMENT (AVR) (N/A) STERNOTOMY (N/A)  Subjective: Patient did not sleep well, despite taking Ambien. Passing flatus but no bowel movement yet.  Objective: Vital signs in last 24 hours: Patient Vitals for the past 24 hrs:  BP Temp Temp src Pulse Resp SpO2 Height Weight  11/05/11 0414 116/57 mmHg 98 F (36.7 C) Oral 81  20  95 % 6\' 2"  (1.88 m) 282 lb 10.1 oz (128.2 kg)  11/04/11 2116 - - - - - 95 % - -  11/04/11 2017 105/56 mmHg 98.6 F (37 C) Oral 87  20  94 % - -  11/04/11 1345 115/62 mmHg 97.9 F (36.6 C) Oral 82  18  91 % - -  11/04/11 1300 132/71 mmHg - - 92  19  98 % - -  11/04/11 1225 - 98.3 F (36.8 C) Oral - - - - -  11/04/11 1200 128/72 mmHg - - 81  20  98 % - -  11/04/11 1100 96/57 mmHg - - 80  19  97 % - -  11/04/11 1000 118/65 mmHg - - 81  18  96 % - -  11/04/11 0922 - - - - - 97 % - -  11/04/11 0900 115/68 mmHg - - 83  23  97 % - -  11/04/11 0800 99/80 mmHg 99.3 F (37.4 C) - 78  19  96 % - -   Pre op weight 106 kg Current Weight  11/05/11 282 lb 10.1 oz (128.2 kg)    Hemodynamic parameters for last 24 hours: PAP: (49)/(23) 49/23 mmHg  Intake/Output from previous day: 10/01 0701 - 10/02 0700 In: 960 [P.O.:720; I.V.:40; IV Piggyback:200] Out: 1985 [Urine:1945; Chest Tube:40]   Physical Exam:  Cardiovascular: RRR;no murmurs Pulmonary: Diminished at bases; no rales, wheezes, or rhonchi. Abdomen: Soft, non tender, bowel sounds present. Extremities: Mild bilateral lower extremity edema. Wound: Dressings clean and dry.   Lab Results: CBC: Basename 11/04/11 0420 11/03/11 1916 11/03/11 1910  WBC 11.4* -- 12.2*  HGB 11.7* 11.6* --  HCT 34.9* 34.0* --  PLT 140* -- 122*   BMET:  Basename 11/04/11 0420 11/03/11 1916  NA 137 140  K 4.2 4.6  CL 104 108  CO2 24 --  GLUCOSE 125* 115*  BUN 18 15   CREATININE 1.01 1.10  CALCIUM 8.4 --    PT/INR:  Lab Results  Component Value Date   INR 1.46 11/03/2011   INR 0.97 10/30/2011   ABG:  INR: Will add last result for INR, ABG once components are confirmed Will add last 4 CBG results once components are confirmed  Assessment/Plan:  1. CV - SR/PACs. Continue Lopressor 25 bid. 2.  Pulmonary - Encourage incentive spirometer and flutter valve.CXR this am shows bilateral pleural effusions L>R, atelectasis at bases, no ptx. 3. Volume Overload - Will give lasix iv this am 4.  Acute blood loss anemia - H and H stable at 11.7 and 34.9 5.Mild thrombocytopenia-platelets 140,000 6.HGA1C 5.5. CBGs have been less than 132.Will stop scheduled insulin and glucose checks  ZIMMERMAN,DONIELLE MPA-C 11/05/2011,7:16 AM  Patient seen and examined. Agree with above. He is doing very well for POD #  2. Continue current care

## 2011-11-06 MED ORDER — POTASSIUM CHLORIDE CRYS ER 20 MEQ PO TBCR: 20.0000 meq | EXTENDED_RELEASE_TABLET | Freq: Every day | ORAL | Status: DC

## 2011-11-06 MED ORDER — ASPIRIN 325 MG PO TBEC: 325.0000 mg | DELAYED_RELEASE_TABLET | Freq: Every day | ORAL | Status: DC

## 2011-11-06 MED ORDER — FUROSEMIDE 40 MG PO TABS
40.0000 mg | ORAL_TABLET | Freq: Every day | ORAL | Status: DC
  Administered 2011-11-06 – 2011-11-07 (×2): 40 mg via ORAL
  Filled 2011-11-06 (×2): qty 1

## 2011-11-06 MED ORDER — POTASSIUM CHLORIDE CRYS ER 20 MEQ PO TBCR
20.0000 meq | EXTENDED_RELEASE_TABLET | Freq: Every day | ORAL | Status: DC
  Administered 2011-11-06 – 2011-11-07 (×2): 20 meq via ORAL
  Filled 2011-11-06 (×2): qty 1

## 2011-11-06 MED ORDER — FUROSEMIDE 40 MG PO TABS: 40.0000 mg | ORAL_TABLET | Freq: Every day | ORAL | Status: DC

## 2011-11-06 MED ORDER — METOPROLOL TARTRATE 25 MG PO TABS: 25.0000 mg | ORAL_TABLET | Freq: Two times a day (BID) | ORAL | Status: DC

## 2011-11-06 MED ORDER — OXYCODONE HCL 5 MG PO TABS: 5.0000 mg | ORAL_TABLET | ORAL | Status: DC | PRN

## 2011-11-06 MED ORDER — LACTULOSE 10 GM/15ML PO SOLN
20.0000 g | Freq: Once | ORAL | Status: DC
  Filled 2011-11-06: qty 30

## 2011-11-06 MED FILL — Magnesium Sulfate Inj 50%: INTRAMUSCULAR | Qty: 10 | Status: AC

## 2011-11-06 MED FILL — Potassium Chloride Inj 2 mEq/ML: INTRAVENOUS | Qty: 40 | Status: AC

## 2011-11-06 MED FILL — Dexmedetomidine HCl IV Soln 200 MCG/2ML: INTRAVENOUS | Qty: 2 | Status: AC

## 2011-11-06 NOTE — Progress Notes (Signed)
CARDIAC REHAB PHASE I   PRE:  Rate/Rhythm: 88 SR BBB PAC's  BP:  Supine: 138/76  Sitting:   Standing:    SaO2: 93 RA  MODE:  Ambulation: 350 ft   POST:  Rate/Rhythem: 112 ST BBB PAC's  BP:  Supine:   Sitting: 148/61  Standing:    SaO2: 98 RA 4098-1191 Assisted X one to ambulate with hand held assist. Gait steady. HR up with activity today. Pt back to bed after walk with call light in reach. Put Recovering from [facility] video on for pt and friend  to view.  Beatrix Fetters

## 2011-11-06 NOTE — Discharge Summary (Addendum)
Physician Discharge Summary  Patient ID: Tim Hardy MRN: [id number] DOB/AGE: November 22, 1940 71 y.o.  Admit date: 11/03/2011 Discharge date: 11/07/2011  Admission Diagnoses: 1.Severe aortic stenosis 2.History of BPH 3.History of GERD 4.History of asthma 5.History of hiatal hernia 6.History of allergic rhinitis 7.History of tobacco abuse  Discharge Diagnoses:  1.Severe aortic stenosis 2.History of BPH 3.History of GERD 4.History of asthma 5.History of hiatal hernia 6.History of allergic rhinitis 7.History of tobacco abuse 8.Mild ABL anemia 9.Thrombocytopenia  Procedure (s):  Partial sternotomy (L through right 4th interspace), aortic valve  replacement with 25 mm [facility] Ease Bovine pericardial valve (model number [id number], serial number [id number]) by Dr. Dorris Fetch on 11/03/2011.  History of Presenting Illness: This is a 71 year old gentleman with known aortic stenosis. He recently has been having shortness of breath with exertion. He's had allergies for 50 years and asthma for a long time and he first attributed his symptoms to those problems. Over the last several months, however, he's noted that he gets severely short of breath with only mild exertion, such as walking 100 -150 feet. He also feels very tired and fatigued when he exerts himself. He also notes shortness of breath and fatigue when he bends over to try to lift anything. He had an echocardiogram in September of 2012 which showed moderate to severe aortic stenosis, but the valve was not visualized well. He went to Dr. Valentina Lucks because of progressive shortness of breath and fatigue and was referred to Dr. Anne Fu. An echocardiogram showed severe aortic stenosis with a V-max of 494 cm/s, peak gradient of 98 mm mercury, mean gradient of 64 mm mercury, and a calculated valve area of 0.5 cm. He then underwent cardiac catheterization. The aortic valve could not be crossed. His right-sided pressures were normal and his cardiac  index was 2.4 by Fick and 1.9 by thermodilution. 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 He was then referred to Dr. Dorris Fetch for the consideration of aortic valve replacement. Potential risks, benefits, and complications were discussed with the patient and he agreed to proceed. He underwent an AVR on 11/03/2011.   Brief Hospital Course:  He was extubated without difficulty early the evening of surgery. He remained afebrile and hemodynamically stable. His Theone Murdoch, a line, chest tubes, and foley were removed early in his post operative course. He was found to have mild ABL anemia. His last H and H was 11.2 and 33. He did not require a post op transfusion.He also had mild thrombocytopenia. His last platelet count was 128,000.He was felt surgically stable for transfer from the ICU to PCTU for further convalescence on post op day 1. He has been tolerating a diet and has had a bowel movement. He is ambulating well on room air. His epicardial pacing wires and chest tube sutures will be removed prior to discharge. Provided he remains afebrile, hemodynamically stable, and pending morning round evaluation, he will be surgically stable for discharge on 11/07/2011.   Latest Vital Signs: Blood pressure 132/81, pulse 94, temperature 98.1 F (36.7 C), temperature source Oral, resp. rate 18, height 6\' 2"  (1.88 m), weight 233 lb 7.5 oz (105.9 kg), SpO2 100.00%.  Physical Exam: Cardiovascular: RRR;no murmurs  Pulmonary: Diminished at bases; no rales, wheezes, or rhonchi.  Abdomen: Soft, non tender, bowel sounds present.  Extremities: Mild bilateral lower extremity edema.  Wound: Dressings clean and dry.    Discharge Condition:Stable  Recent laboratory studies:  Lab Results  Component Value Date  WBC 13.7* 11/05/2011   HGB 11.2* 11/05/2011   HCT 33.0* 11/05/2011   MCV 92.4 11/05/2011   PLT 128* 11/05/2011   Lab Results  Component Value Date   NA  133* 11/05/2011   K 4.2 11/05/2011   CL 99 11/05/2011   CO2 26 11/05/2011   CREATININE 1.05 11/05/2011   GLUCOSE 115* 11/05/2011      Diagnostic Studies: Dg Chest 2 View  11/05/2011  *RADIOLOGY REPORT*  Clinical Data: Chest pain  CHEST - 2 VIEW  Comparison: 11/04/2011  Findings: Interval removal of the right IJ Swan-Ganz catheter. Prominent cardiomediastinal contours status post median sternotomy and aortic valve replacement.  Left greater than right pleural effusions are small.  Associated consolidations are similar to prior.  No pneumothorax.  No interval osseous change.  Mild right AC joint widening.  IMPRESSION: Prominent cardiomediastinal contours are similar to prior status post median sternotomy and AVR.  Small left greater than right pleural effusions with associated consolidations; atelectasis versus infiltrate.   Original Report Authenticated By: Waneta Martins, M.D.    Discharge Medications:   Medication List     As of 11/07/2011  8:55 AM    TAKE these medications         albuterol 108 (90 BASE) MCG/ACT inhaler   Commonly known as: PROVENTIL HFA;VENTOLIN HFA   Inhale 2 puffs into the lungs every 6 (six) hours as needed. For shortness of breath.      aspirin 325 MG EC tablet   Take 1 tablet (325 mg total) by mouth daily.      B-12 PO   Take 1 tablet by mouth daily.      budesonide-formoterol 160-4.5 MCG/ACT inhaler   Commonly known as: SYMBICORT   Inhale 2 puffs into the lungs 2 (two) times daily.      cetirizine 10 MG tablet   Commonly known as: ZYRTEC   Take 10 mg by mouth daily.      fluticasone 50 MCG/ACT nasal spray   Commonly known as: FLONASE   Place 2 sprays into the nose daily.      furosemide 40 MG tablet   Commonly known as: LASIX   Take 1 tablet (40 mg total) by mouth daily. For 4 days then stop.      metoprolol tartrate 25 MG tablet   Commonly known as: LOPRESSOR   Take 1 tablet (25 mg total) by mouth 2 (two) times daily.      multivitamin with  minerals Tabs   Take 1 tablet by mouth daily.      NEXIUM 40 MG capsule   Generic drug: esomeprazole   Take 40 mg by mouth daily before breakfast.      potassium chloride SA 20 MEQ tablet   Commonly known as: K-DUR,KLOR-CON   Take 1 tablet (20 mEq total) by mouth daily. For 4 days then stop      Tamsulosin HCl 0.4 MG Caps   Commonly known as: FLOMAX   Take 0.4 mg by mouth daily after supper.      traMADol 50 MG tablet   Commonly known as: ULTRAM   Take 1 tablet (50 mg total) by mouth every 6 (six) hours as needed for pain.           Follow Up Appointments:     Follow-up Information    Schedule an appointment as soon as possible for a visit with Anne Fu, MARK, MD. (for 2 weeks)    Contact information:   [address] [place] [address] [phone]       Follow up with HENDRICKSON,STEVEN C, MD. (PA/LAT CXR to be taken 45 minutes prior to office appointment with Dr. Dorris Fetch (at [facility] which is in the same building as Dr. Sunday Corn office);Office will call with an appointment date and time    Contact information:   [address] [place] [address] [phone]          Signed: Doree Fudge MPA-C 11/06/2011, 8:38 AM

## 2011-11-06 NOTE — Progress Notes (Addendum)
                   [address]            [place] [address]          [phone]      3 Days Post-Op Procedure(s) (LRB): AORTIC VALVE REPLACEMENT (AVR) (N/A) STERNOTOMY (N/A)  Subjective: Patient had an episode last evening where he felt like he was drowning and could not catch his breath. He was given Symbicort, which helped. Breathing better now. Still not able to sleep.  Objective: Vital signs in last 24 hours: Patient Vitals for the past 24 hrs:  BP Temp Temp src Pulse Resp SpO2 Weight  11/06/11 0516 132/81 mmHg 98.1 F (36.7 C) Oral 94  18  100 % 233 lb 7.5 oz (105.9 kg)  11/05/11 2108 118/57 mmHg 98.2 F (36.8 C) Oral 84  18  94 % -  11/05/11 1332 113/66 mmHg 99.3 F (37.4 C) Oral 89  18  96 % -   Pre op weight 106 kg Current Weight  11/06/11 233 lb 7.5 oz (105.9 kg)      Intake/Output from previous day: 10/02 0701 - 10/03 0700 In: 480 [P.O.:480] Out: 700 [Urine:700]   Physical Exam:  Cardiovascular: RRR;no murmurs Pulmonary: Diminished at bases; no rales, wheezes, or rhonchi. Abdomen: Soft, non tender, bowel sounds present. Extremities: Mild bilateral lower extremity edema. Wound: Dressings clean and dry.   Lab Results: CBC:  Basename 11/05/11 0640 11/04/11 0420  WBC 13.7* 11.4*  HGB 11.2* 11.7*  HCT 33.0* 34.9*  PLT 128* 140*   BMET:   Basename 11/05/11 0640 11/04/11 0420  NA 133* 137  K 4.2 4.2  CL 99 104  CO2 26 24  GLUCOSE 115* 125*  BUN 25* 18  CREATININE 1.05 1.01  CALCIUM 8.6 8.4    PT/INR:  Lab Results  Component Value Date   INR 1.46 11/03/2011   INR 0.97 10/30/2011   ABG:  INR: Will add last result for INR, ABG once components are confirmed Will add last 4 CBG results once components are confirmed  Assessment/Plan:  1. CV - SR/PACs. Continue Lopressor 25 bid. 2.  Pulmonary - Encourage incentive spirometer and flutter valve.Last CXR showed bilateral pleural effusions L>R, atelectasis at bases, no ptx. 3.  Volume Overload - Continue Lasix 4.  Acute blood loss anemia - H and H stable at 11.2 and 33 5.Mild thrombocytopenia-platelets 128,000 6.Remove EPW in am 7.Possibly discharge in am  ZIMMERMAN,DONIELLE MPA-C 11/06/2011,7:25 AM  Patient seen and examined. Agree with above. Is having some PACs currently- hopefully lopressor will quiet that down Possibly home in AM if continues to progress.

## 2011-11-07 MED ORDER — TRAMADOL HCL 50 MG PO TABS: 50.0000 mg | ORAL_TABLET | Freq: Four times a day (QID) | ORAL | Status: DC | PRN

## 2011-11-07 MED ORDER — METOPROLOL TARTRATE 25 MG PO TABS: 25.0000 mg | ORAL_TABLET | Freq: Two times a day (BID) | ORAL | Status: DC

## 2011-11-07 NOTE — Progress Notes (Addendum)
                   [address]            [place] [address]          [phone]      4 Days Post-Op Procedure(s) (LRB): AORTIC VALVE REPLACEMENT (AVR) (N/A) STERNOTOMY (N/A)  Subjective: Patient states Oxycodone made him a bit crazy last evening.He did have a bowel movement.  Objective: Vital signs in last 24 hours: Patient Vitals for the past 24 hrs:  BP Temp Temp src Pulse Resp SpO2 Weight  11/07/11 0518 114/78 mmHg 98.9 F (37.2 C) Oral 80  18  98 % -  11/07/11 0232 - - - - - - 204 lb 5.9 oz (92.7 kg)  11/06/11 2014 126/66 mmHg 99.5 F (37.5 C) Oral 90  18  94 % -  11/06/11 1450 116/57 mmHg 98.5 F (36.9 C) Oral 81  18  96 % -   Pre op weight 106 kg Current Weight  11/07/11 204 lb 5.9 oz (92.7 kg)      Intake/Output from previous day: 10/03 0701 - 10/04 0700 In: 840 [P.O.:840] Out: 500 [Urine:500]   Physical Exam:  Cardiovascular: RRR;no murmurs Pulmonary: Diminished at bases; no rales, wheezes, or rhonchi. Abdomen: Soft, non tender, bowel sounds present. Extremities: Mild bilateral lower extremity edema. Wound: Dressings clean and dry.   Lab Results: CBC:  Basename 11/05/11 0640  WBC 13.7*  HGB 11.2*  HCT 33.0*  PLT 128*   BMET:   Basename 11/05/11 0640  NA 133*  K 4.2  CL 99  CO2 26  GLUCOSE 115*  BUN 25*  CREATININE 1.05  CALCIUM 8.6    PT/INR:  Lab Results  Component Value Date   INR 1.46 11/03/2011   INR 0.97 10/30/2011   ABG:  INR: Will add last result for INR, ABG once components are confirmed Will add last 4 CBG results once components are confirmed  Assessment/Plan:  1. CV - Frequent PVCs this am/SR. On Lopressor 25 bid.May need to increase 2.  Pulmonary - Encourage incentive spirometer and flutter valve.Last CXR showed bilateral pleural effusions L>R, atelectasis at bases, no ptx. 3. Volume Overload - Continue Lasix 4.  Acute blood loss anemia - H and H stable at 11.2 and 33 5.Mild  thrombocytopenia-platelets 128,000 6.Stop oxy;Ultram PRN pain 7.Remove EPW and CT sutures 8.Probable discharge later this am  ZIMMERMAN,DONIELLE MPA-C 11/07/2011,7:28 AM  Patient seen and examined. Agree with above.

## 2011-11-07 NOTE — Progress Notes (Addendum)
0905-1000 Cardiac Rehab Completed discharge education with pt and friend. He voices understanding. Pt agrees to Outpt. To Outpt. [facility] in [place], will send referral.Discussed cessation of chewing tobacco with pt. Gave pt tips for quitting and coaching lines numbers.He voices that he is interested in quitting.

## 2011-11-07 NOTE — Progress Notes (Signed)
Discharged instructions given to pt and pt's significant other along with prescriptions. Pt verbalized understanding. Pt stable and ready for discharge

## 2011-11-07 NOTE — Progress Notes (Signed)
Removed pt's EPW and chest tube sutures per MD order. INR is WNL. Pt has been having frequent PVC's but MD aware and still wants wires out. Tips were intact. Placed steri strips on site where CT sutures removed along with benzoin. Pt on bed rest for one hour with frequent vitals set up.

## 2011-11-19 ENCOUNTER — Other Ambulatory Visit: Payer: Self-pay | Admitting: Thoracic Surgery (Cardiothoracic Vascular Surgery)

## 2011-11-19 DIAGNOSIS — I2510 Atherosclerotic heart disease of native coronary artery without angina pectoris: Secondary | ICD-10-CM

## 2011-11-20 ENCOUNTER — Other Ambulatory Visit: Payer: Self-pay | Admitting: Thoracic Surgery (Cardiothoracic Vascular Surgery)

## 2011-11-20 DIAGNOSIS — I359 Nonrheumatic aortic valve disorder, unspecified: Secondary | ICD-10-CM

## 2011-11-24 ENCOUNTER — Ambulatory Visit (INDEPENDENT_AMBULATORY_CARE_PROVIDER_SITE_OTHER): Payer: Self-pay | Admitting: Physician Assistant

## 2011-11-24 VITALS — BP 120/77 | HR 41 | Temp 99.1°F | Resp 18 | Ht 73.0 in | Wt 236.0 lb

## 2011-11-24 DIAGNOSIS — I350 Nonrheumatic aortic (valve) stenosis: Secondary | ICD-10-CM

## 2011-11-24 DIAGNOSIS — Z952 Presence of prosthetic heart valve: Secondary | ICD-10-CM

## 2011-11-24 DIAGNOSIS — I359 Nonrheumatic aortic valve disorder, unspecified: Secondary | ICD-10-CM

## 2011-11-24 DIAGNOSIS — Z954 Presence of other heart-valve replacement: Secondary | ICD-10-CM

## 2011-11-24 NOTE — Progress Notes (Signed)
  HPI: Patient returns for routine postoperative follow-up having undergone Aortic Valve Replacement on 11/03/2011 The patient's early postoperative recovery while in the hospital was unremarkable. Since hospital discharge the patient reports he was originally doing very well.  However, over the past week he has noticed he just doesn't feel very well.  He states he is very tired and weak.  He also states that his heart rate has been low consistently. He states he is trying to ambulate several times per day but just gets very fatigued and has to stop.  He is questioning if maybe some of his new medications can be causing his symptoms.    Current Outpatient Prescriptions  Medication Sig Dispense Refill  . albuterol (PROVENTIL HFA;VENTOLIN HFA) 108 (90 BASE) MCG/ACT inhaler Inhale 2 puffs into the lungs every 6 (six) hours as needed. For shortness of breath.      Marland Kitchen aspirin 325 MG EC tablet Take 81 mg by mouth daily. 81 mg tab, 2 tabs in am and 2 tabs at night      . budesonide-formoterol (SYMBICORT) 160-4.5 MCG/ACT inhaler Inhale 2 puffs into the lungs 2 (two) times daily.      . cetirizine (ZYRTEC) 10 MG tablet Take 10 mg by mouth daily.      . Cyanocobalamin (B-12 PO) Take 1 tablet by mouth daily.      . fluticasone (FLONASE) 50 MCG/ACT nasal spray Place 2 sprays into the nose daily.      . metoprolol tartrate (LOPRESSOR) 25 MG tablet Take 1 tablet (25 mg total) by mouth 2 (two) times daily.  60 tablet  1  . Multiple Vitamin (MULTIVITAMIN WITH MINERALS) TABS Take 1 tablet by mouth daily.      Marland Kitchen NEXIUM 40 MG capsule Take 40 mg by mouth daily before breakfast.       . Tamsulosin HCl (FLOMAX) 0.4 MG CAPS Take 0.4 mg by mouth daily after supper.       . traMADol (ULTRAM) 50 MG tablet Take 1 tablet (50 mg total) by mouth every 6 (six) hours as needed for pain.  30 tablet  0    Physical Exam:  BP 120/77  Pulse 41  Temp 99.1 F (37.3 C) (Oral)  Resp 18  Ht 6\' 1"  (1.854 m)  Wt 236 lb (107.049 kg)   BMI 31.14 kg/m2  SpO2 97%  Gen: no apparent distress Heart: RRR, sternum stable Lungs: CTA bilaterally Abd: soft non-tender, non-distended Skin: incisions clean and dry  Diagnostic Tests:  CXR (10/16): no evidence of pneumothorax or pleural effusion, post operative changes  Impression:  Tim Hardy is S/P AVR from 11/03/2011.  Overall I think he is doing well.  I feel that his weakness and fatigue is related to his Beta Blocker use causing Bradycardia.  His heart rate at today's visit was 40.  He was instructed he could begin driving short distances with supervision.  However, I instructed the patient that until he feels better he may want to hold off on driving for now.  He was encouraged to continue with his ambulation  Plan:  I feel we should discontinue his Beta Blocker which I feel is the cause of his weakness and fatigue.  We will bring him back to clinic in 2 weeks to assess how is feeling after removal of this medication.  The patient was instructed to contact our office sooner should his symptoms worsen.

## 2011-11-25 ENCOUNTER — Encounter: Payer: Medicare Other | Admitting: Thoracic Surgery (Cardiothoracic Vascular Surgery)

## 2011-12-09 ENCOUNTER — Ambulatory Visit (INDEPENDENT_AMBULATORY_CARE_PROVIDER_SITE_OTHER): Payer: Self-pay | Admitting: Thoracic Surgery (Cardiothoracic Vascular Surgery)

## 2011-12-09 ENCOUNTER — Encounter: Payer: Self-pay | Admitting: Thoracic Surgery (Cardiothoracic Vascular Surgery)

## 2011-12-09 VITALS — BP 132/81 | HR 49 | Temp 98.7°F | Resp 16 | Ht 73.0 in | Wt 236.0 lb

## 2011-12-09 DIAGNOSIS — Z954 Presence of other heart-valve replacement: Secondary | ICD-10-CM

## 2011-12-09 DIAGNOSIS — I359 Nonrheumatic aortic valve disorder, unspecified: Secondary | ICD-10-CM

## 2011-12-09 DIAGNOSIS — Z952 Presence of prosthetic heart valve: Secondary | ICD-10-CM

## 2011-12-09 DIAGNOSIS — I350 Nonrheumatic aortic (valve) stenosis: Secondary | ICD-10-CM

## 2011-12-09 NOTE — Progress Notes (Signed)
  HPI:  [name] returns today for postoperative followup. He is a 71 year old gentleman who underwent aortic valve replacement for a partial sternotomy on 11/03/2011. His postoperative course was uncomplicated. He was seen in the office 2 weeks ago by Lowella Dandy. At that time he was complaining of feeling tired and rundown. He was noted to be significantly bradycardic with a heart rate around 40. His beta blocker was discontinued. He says that since that time he felt much better. He still notices his heart will skip a beat occasionally. He otherwise feels well. His exercise tolerance is improving and is already better than it was preoperatively. He's having minimal incisional discomfort.  Past Medical History  Diagnosis Date  . BPH (benign prostatic hyperplasia)   . GERD (gastroesophageal reflux disease)   . Perennial allergic rhinitis   . Aortic valve stenosis, moderate     LVH  . Diastolic dysfunction   . Asthma   . Colon polyps   . Colon, diverticulosis   . Hiatal hernia   . ED (erectile dysfunction)   . Heart murmur   . Bronchitis   . Shortness of breath   . Cancer     skin cancer removed in August    Current Outpatient Prescriptions  Medication Sig Dispense Refill  . albuterol (PROVENTIL HFA;VENTOLIN HFA) 108 (90 BASE) MCG/ACT inhaler Inhale 2 puffs into the lungs every 6 (six) hours as needed. For shortness of breath.      Marland Kitchen aspirin 325 MG EC tablet Take 81 mg by mouth daily. 81 mg tab, 2 tabs in am and 2 tabs at night      . budesonide-formoterol (SYMBICORT) 160-4.5 MCG/ACT inhaler Inhale 2 puffs into the lungs 2 (two) times daily.      . cetirizine (ZYRTEC) 10 MG tablet Take 10 mg by mouth daily.      . Cyanocobalamin (B-12 PO) Take 1 tablet by mouth daily.      . fluticasone (FLONASE) 50 MCG/ACT nasal spray Place 2 sprays into the nose daily.      . Multiple Vitamin (MULTIVITAMIN WITH MINERALS) TABS Take 1 tablet by mouth daily.      Marland Kitchen NEXIUM 40 MG capsule Take 40 mg  by mouth daily before breakfast.       . Tamsulosin HCl (FLOMAX) 0.4 MG CAPS Take 0.4 mg by mouth daily after supper.         Physical Exam BP 132/81  Pulse 49  Temp 98.7 F (37.1 C) (Oral)  Resp 16  Ht 6\' 1"  (1.854 m)  Wt 236 lb (107.049 kg)  BMI 31.14 kg/m2  SpO2 94% Gen. 71 year old male in no acute distress Neurologic alert and oriented x3 with no deficits Cardiac regular rate and rhythm 2/6 systolic murmur Lungs clear with equal breath sounds Sternum stable, incision well-healed  Diagnostic Tests: None  Impression: 71 year old male a little over a month out from aortic valve replacement through partial sternotomy. He is doing extremely well at this time. I encouraged him to continue to increase his physical activities gradually. He has started driving, and has not had any difficulty with that. He is not to lift anything over 10 pounds for another 2 weeks.  I did caution him to use antibiotic prophylaxis whenever he is having dental work done.  Plan: He will continue to follow with Dr. Donato Schultz.  I will be happy to see him back any time if I can be of any further assistance with his care.

## 2012-12-08 ENCOUNTER — Encounter: Payer: Self-pay | Admitting: Cardiology

## 2012-12-08 DIAGNOSIS — R011 Cardiac murmur, unspecified: Secondary | ICD-10-CM | POA: Insufficient documentation

## 2012-12-08 DIAGNOSIS — K219 Gastro-esophageal reflux disease without esophagitis: Secondary | ICD-10-CM | POA: Insufficient documentation

## 2012-12-08 DIAGNOSIS — N400 Enlarged prostate without lower urinary tract symptoms: Secondary | ICD-10-CM | POA: Insufficient documentation

## 2012-12-08 DIAGNOSIS — I5189 Other ill-defined heart diseases: Secondary | ICD-10-CM | POA: Insufficient documentation

## 2012-12-08 DIAGNOSIS — I350 Nonrheumatic aortic (valve) stenosis: Secondary | ICD-10-CM | POA: Insufficient documentation

## 2012-12-08 DIAGNOSIS — R0602 Shortness of breath: Secondary | ICD-10-CM | POA: Insufficient documentation

## 2012-12-08 DIAGNOSIS — J3089 Other allergic rhinitis: Secondary | ICD-10-CM | POA: Insufficient documentation

## 2012-12-08 DIAGNOSIS — C801 Malignant (primary) neoplasm, unspecified: Secondary | ICD-10-CM | POA: Insufficient documentation

## 2012-12-09 ENCOUNTER — Encounter: Payer: Self-pay | Admitting: Cardiology

## 2012-12-09 ENCOUNTER — Ambulatory Visit (INDEPENDENT_AMBULATORY_CARE_PROVIDER_SITE_OTHER): Payer: Medicare Other | Admitting: Cardiology

## 2012-12-09 VITALS — BP 149/83 | HR 87 | Ht 73.0 in | Wt 241.4 lb

## 2012-12-09 DIAGNOSIS — I5189 Other ill-defined heart diseases: Secondary | ICD-10-CM

## 2012-12-09 DIAGNOSIS — I350 Nonrheumatic aortic (valve) stenosis: Secondary | ICD-10-CM

## 2012-12-09 DIAGNOSIS — I519 Heart disease, unspecified: Secondary | ICD-10-CM

## 2012-12-09 DIAGNOSIS — I359 Nonrheumatic aortic valve disorder, unspecified: Secondary | ICD-10-CM

## 2012-12-09 DIAGNOSIS — R9439 Abnormal result of other cardiovascular function study: Secondary | ICD-10-CM

## 2012-12-09 NOTE — Progress Notes (Signed)
.      [address] [place], [place]  [address] Phone: [phone] Fax:  [phone]  Date:  12/09/2012   ID:  Tim Hardy, DOB 06-19-1940, MRN [id number]  PCP:  Lillia Mountain, MD   History of Present Illness: Tim Hardy is a 72 y.o. male with aortic valve replacement in September of 2013 here for followup.  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 Very appreciative. No CP, no SOB. Having a little fatigue. Understands that he is overweight.      Wt Readings from Last 3 Encounters:  12/09/12 241 lb 6.4 oz (109.498 kg)  12/09/11 236 lb (107.049 kg)  11/24/11 236 lb (107.049 kg)     Past Medical History  Diagnosis Date  . BPH (benign prostatic hyperplasia)   . GERD (gastroesophageal reflux disease)   . Perennial allergic rhinitis   . Aortic valve stenosis, moderate     LVH  . Diastolic dysfunction   . Asthma   . Colon polyps   . Colon, diverticulosis   . Hiatal hernia   . ED (erectile dysfunction)   . Heart murmur   . Bronchitis   . Shortness of breath   . Cancer     skin cancer removed in August  . Basal cell adenocarcinoma     Past Surgical History  Procedure Laterality Date  . Knee arthroscopy Bilateral   . Shoulder arthroscopy Right   . Hernia repair Bilateral   . Tonsillectomy    . Appendectomy    . Cardiac catheterization    . Colonoscopy w/ polypectomy    . Aortic valve replacement  11/03/2011    Procedure: AORTIC VALVE REPLACEMENT (AVR);  Surgeon: Loreli Slot, MD;  [facility];  Service: Open Heart Surgery;  Laterality: N/A;  Partial Sternotomy  . Sternotomy  11/03/2011    Procedure: STERNOTOMY;  Surgeon: Loreli Slot, MD;  Location: [facility] OR;  Service: Open Heart Surgery;  Laterality: N/A;  Partial  . Femoral hernia repair      , x5    Current Outpatient Prescriptions  Medication Sig Dispense Refill  . albuterol (PROVENTIL HFA;VENTOLIN HFA) 108 (90 BASE) MCG/ACT inhaler Inhale 2  puffs into the lungs every 6 (six) hours as needed. For shortness of breath.      Marland Kitchen aspirin 325 MG EC tablet Take 81 mg by mouth daily. 81 mg tab, 2 tabs in am and 2 tabs at night      . budesonide-formoterol (SYMBICORT) 160-4.5 MCG/ACT inhaler Inhale 2 puffs into the lungs 2 (two) times daily.      . cetirizine (ZYRTEC) 10 MG tablet Take 10 mg by mouth daily.      . Cyanocobalamin (B-12 PO) Take 1 tablet by mouth daily.      . fluticasone (FLONASE) 50 MCG/ACT nasal spray Place 2 sprays into the nose daily.      . Multiple Vitamin (MULTIVITAMIN WITH MINERALS) TABS Take 1 tablet by mouth daily.      Marland Kitchen NEXIUM 40 MG capsule Take 40 mg by mouth daily before breakfast.       . sildenafil (VIAGRA) 50 MG tablet Take 50 mg by mouth daily as needed for erectile dysfunction.      . Tamsulosin HCl (FLOMAX) 0.4 MG CAPS Take 0.4 mg by mouth daily after supper.        No current facility-administered medications for this visit.    Allergies:  Allergies  Allergen Reactions  . Penicillins Rash    Social History:  The patient  reports that he quit smoking about 40 years ago. His smoking use included Cigarettes. He started smoking about 41 years ago. He smoked 0.00 packs per day. His smokeless tobacco use includes Chew. He reports that he drinks alcohol. He reports that he does not use illicit drugs.   ROS:  Please see the history of present illness.   Denies any syncope, bleeding, orthopnea, stroke    PHYSICAL EXAM: VS:  BP 149/83  Pulse 87  Ht 6\' 1"  (1.854 m)  Wt 241 lb 6.4 oz (109.498 kg)  BMI 31.86 kg/m2 Well nourished, well developed, in no acute distress HEENT: normal Neck: no JVD Cardiac:  normal S1, S2; RRR; Soft systolic murmur Lungs:  clear to auscultation bilaterally, no wheezing, rhonchi or rales Abd: soft, nontender, no hepatomegaly Ext: no edema Skin: warm and dry Neuro: no focal abnormalities noted  EKG:  Sinus rhythm, 87, nonspecific ST changes     ASSESSMENT AND  PLAN:  1. Aortic valve replacement-doing well. No changes made.he is very kind and considerate. Very thankful. 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 He has been taking dental antibiotics prophylactically. 2. Obesity-encourage weight loss. Low carbohydrate diet  Signed, Donato Schultz, MD [facility]  12/09/2012 2:21 PM

## 2012-12-09 NOTE — Patient Instructions (Signed)
Your physician recommends that you continue on your current medications as directed. Please refer to the Current Medication list given to you today.  Your physician wants you to follow-up in: 6 months with Dr. Skains. You will receive a reminder letter in the mail two months in advance. If you don't receive a letter, please call our office to schedule the follow-up appointment.  

## 2013-06-16 ENCOUNTER — Ambulatory Visit: Payer: Medicare Other | Admitting: Cardiology

## 2013-06-16 ENCOUNTER — Ambulatory Visit (INDEPENDENT_AMBULATORY_CARE_PROVIDER_SITE_OTHER): Payer: Medicare Other | Admitting: Cardiology

## 2013-06-16 ENCOUNTER — Encounter: Payer: Self-pay | Admitting: Cardiology

## 2013-06-16 VITALS — BP 134/77 | HR 84 | Ht 73.0 in | Wt 241.0 lb

## 2013-06-16 DIAGNOSIS — E7800 Pure hypercholesterolemia, unspecified: Secondary | ICD-10-CM

## 2013-06-16 DIAGNOSIS — I2510 Atherosclerotic heart disease of native coronary artery without angina pectoris: Secondary | ICD-10-CM | POA: Insufficient documentation

## 2013-06-16 DIAGNOSIS — E669 Obesity, unspecified: Secondary | ICD-10-CM

## 2013-06-16 DIAGNOSIS — I359 Nonrheumatic aortic valve disorder, unspecified: Secondary | ICD-10-CM

## 2013-06-16 DIAGNOSIS — C61 Malignant neoplasm of prostate: Secondary | ICD-10-CM

## 2013-06-16 MED ORDER — ATORVASTATIN CALCIUM 20 MG PO TABS: 20.0000 mg | ORAL_TABLET | Freq: Every day | ORAL | Status: DC

## 2013-06-16 NOTE — Patient Instructions (Signed)
Your physician has recommended you make the following change in your medication:   1. Start Atorvastatin 20 mg once daily  Your physician recommends that you return for lab work in: 2 months, Lipid, ALT  Your physician wants you to follow-up in: 6 months with Dr. Marlou Porch. You will receive a reminder letter in the mail two months in advance. If you don't receive a letter, please call our office to schedule the follow-up appointment.

## 2013-06-16 NOTE — Progress Notes (Signed)
[facility]. [address], [place]  [address] Phone: [phone] Fax:  [phone]  Date:  06/16/2013   ID:  Tim Hardy, DOB 1940-09-29, MRN [id number]  PCP:  Irven Shelling, MD   History of Present Illness: Tim Hardy is a 73 y.o. male with aortic valve replacement in September of 2013 here for followup.  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 Very appreciative. No CP, no SOB. Having a little fatigue. Understands that he is overweight.   Cramps with Symbicort.   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    Wt Readings from Last 3 Encounters:  06/16/13 241 lb (109.317 kg)  12/09/12 241 lb 6.4 oz (109.498 kg)  12/09/11 236 lb (107.049 kg)     Past Medical History  Diagnosis Date  . BPH (benign prostatic hyperplasia)   . GERD (gastroesophageal reflux disease)   . Perennial allergic rhinitis   . Aortic valve stenosis, moderate     LVH  . Diastolic dysfunction   . Asthma   . Colon polyps   . Colon, diverticulosis   . Hiatal hernia   . ED (erectile dysfunction)   . Heart murmur   . Bronchitis   . Shortness of breath   . Cancer     skin cancer removed in August  . Basal cell adenocarcinoma     Past Surgical History  Procedure Laterality Date  . Knee arthroscopy Bilateral   . Shoulder arthroscopy Right   . Hernia repair Bilateral   . Tonsillectomy    . Appendectomy    . Cardiac catheterization    . Colonoscopy w/ polypectomy    . Aortic valve replacement  11/03/2011    Procedure: AORTIC VALVE REPLACEMENT (AVR);  Surgeon: Melrose Nakayama, MD;  Location: [facility];  Service: Open Heart Surgery;  Laterality: N/A;  Partial Sternotomy  . Sternotomy  11/03/2011    Procedure: STERNOTOMY;  Surgeon: Melrose Nakayama, MD;  Location: [facility];  Service: Open Heart Surgery;  Laterality: N/A;  Partial  . Femoral hernia repair      , x5    Current Outpatient Prescriptions  Medication Sig Dispense Refill  . albuterol (PROVENTIL HFA;VENTOLIN HFA) 108  (90 BASE) MCG/ACT inhaler Inhale 2 puffs into the lungs every 6 (six) hours as needed. For shortness of breath.      Marland Kitchen aspirin EC 81 MG tablet Take 81 mg by mouth daily.      . cetirizine (ZYRTEC) 10 MG tablet Take 10 mg by mouth daily.      . Cyanocobalamin (B-12 PO) Take 1 tablet by mouth daily.      . fluticasone (FLONASE) 50 MCG/ACT nasal spray Place 2 sprays into the nose daily.      . Hydrocodone-Acetaminophen 7.5-300 MG TABS       . Multiple Vitamin (MULTIVITAMIN WITH MINERALS) TABS Take 1 tablet by mouth daily.      Marland Kitchen NEXIUM 40 MG capsule Take 40 mg by mouth daily before breakfast.       . sildenafil (VIAGRA) 50 MG tablet Take 50 mg by mouth daily as needed for erectile dysfunction.      . Tamsulosin HCl (FLOMAX) 0.4 MG CAPS Take 0.4 mg by mouth daily after supper.        No current facility-administered medications for this visit.    Allergies:    Allergies  Allergen Reactions  . Penicillins Rash    Social History:  The patient  reports that he quit smoking about 41 years ago. His smoking use included Cigarettes. He started smoking about 42 years ago. He smoked 0.00 packs per day. His smokeless tobacco use includes Chew. He reports that he drinks alcohol. He reports that he does not use illicit drugs.   ROS:  Please see the history of present illness.   Denies any syncope, bleeding, orthopnea, stroke    PHYSICAL EXAM: VS:  BP 134/77  Pulse 84  Ht 6\' 1"  (1.854 m)  Wt 241 lb (109.317 kg)  BMI 31.80 kg/m2 Well nourished, well developed, in no acute distress HEENT: normal Neck: no JVD Cardiac:  normal S1, S2; RRR; Soft systolic murmur Lungs:  clear to auscultation bilaterally, no wheezing, rhonchi or rales Abd: soft, nontender, no hepatomegaly Ext: no edema Skin: warm and dry Neuro: no focal abnormalities noted  EKG:  Sinus rhythm, 87, nonspecific ST changes    LABS: LDL 102 in 2012  ASSESSMENT AND PLAN:  1. Aortic valve replacement-doing well. No changes made. He is  very kind and considerate. Very thankful. 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 He has been taking dental antibiotics prophylactically. 2. Obesity-encourage weight loss. Low carbohydrate diet 3. CAD - mild 30-40% LAD. Non flow limiting dz.  Monitor for symptoms. ASA.  4. 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 5. Hyperlipidemia-LDL 102 in 2012, nonobstructive CAD. I will start atorvastatin 20 mg once a day. Recheck lipid profile and ALT in 2 months. 6. 6 month f/u.  Signed, Candee Furbish, MD [facility]  06/16/2013 4:27 PM

## 2013-08-16 ENCOUNTER — Other Ambulatory Visit (INDEPENDENT_AMBULATORY_CARE_PROVIDER_SITE_OTHER): Payer: Medicare Other

## 2013-08-16 DIAGNOSIS — E7800 Pure hypercholesterolemia, unspecified: Secondary | ICD-10-CM

## 2013-08-16 DIAGNOSIS — I2510 Atherosclerotic heart disease of native coronary artery without angina pectoris: Secondary | ICD-10-CM

## 2013-08-16 DIAGNOSIS — I359 Nonrheumatic aortic valve disorder, unspecified: Secondary | ICD-10-CM

## 2013-08-16 LAB — LIPID PANEL
CHOL/HDL RATIO: 5
Cholesterol: 164 mg/dL (ref 0–200)
HDL: 33 mg/dL — ABNORMAL LOW (ref >39.00)
LDL CALC: 103 mg/dL — AB (ref 0–99)
NONHDL: 131
Triglycerides: 141 mg/dL (ref 0.0–149.0)
VLDL: 28.2 mg/dL (ref 0.0–40.0)

## 2013-08-16 LAB — ALT: ALT: 21 U/L (ref 0–53)

## 2013-08-26 ENCOUNTER — Telehealth: Payer: Self-pay | Admitting: Cardiology

## 2013-08-26 DIAGNOSIS — E7800 Pure hypercholesterolemia, unspecified: Secondary | ICD-10-CM

## 2013-08-26 NOTE — Telephone Encounter (Signed)
Follow up:    Pt called for his 7/14 lab results over the phone please.  Pt would also like them mailed to him.

## 2013-08-26 NOTE — Telephone Encounter (Signed)
Notified of lab results.  Repeat in 1 year.  Mailed copy to pt.

## 2013-11-27 ENCOUNTER — Other Ambulatory Visit: Payer: Self-pay | Admitting: Cardiology

## 2013-12-30 ENCOUNTER — Ambulatory Visit (INDEPENDENT_AMBULATORY_CARE_PROVIDER_SITE_OTHER): Payer: Medicare Other | Admitting: Cardiology

## 2013-12-30 ENCOUNTER — Encounter: Payer: Self-pay | Admitting: Cardiology

## 2013-12-30 VITALS — BP 130/88 | HR 79 | Ht 73.0 in | Wt 236.0 lb

## 2013-12-30 DIAGNOSIS — I2583 Coronary atherosclerosis due to lipid rich plaque: Secondary | ICD-10-CM

## 2013-12-30 DIAGNOSIS — I5189 Other ill-defined heart diseases: Secondary | ICD-10-CM

## 2013-12-30 DIAGNOSIS — E7800 Pure hypercholesterolemia, unspecified: Secondary | ICD-10-CM

## 2013-12-30 DIAGNOSIS — I519 Heart disease, unspecified: Secondary | ICD-10-CM

## 2013-12-30 DIAGNOSIS — I2510 Atherosclerotic heart disease of native coronary artery without angina pectoris: Secondary | ICD-10-CM

## 2013-12-30 DIAGNOSIS — Z954 Presence of other heart-valve replacement: Secondary | ICD-10-CM

## 2013-12-30 DIAGNOSIS — Z952 Presence of prosthetic heart valve: Secondary | ICD-10-CM | POA: Insufficient documentation

## 2013-12-30 LAB — LIPID PANEL
Cholesterol: 151 mg/dL (ref 0–200)
HDL: 34 mg/dL — ABNORMAL LOW (ref >39)
LDL Cholesterol: 78 mg/dL (ref 0–99)
Total CHOL/HDL Ratio: 4.4 Ratio
Triglycerides: 194 mg/dL — ABNORMAL HIGH (ref <150)
VLDL: 39 mg/dL (ref 0–40)

## 2013-12-30 NOTE — Progress Notes (Signed)
[facility]. [address], [address] [place], [place]  [address] Phone: [phone] Fax:  [phone]  Date:  12/30/2013   ID:  Tim Hardy, DOB 13-Jun-1940, MRN [id number]  PCP:  Irven Shelling, MD   History of Present Illness: Tim Hardy is a 73 y.o. male with aortic valve replacement in September of 2013 here for followup.  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 Very appreciative. No CP, no SOB. Having a little fatigue. Understands that he is overweight.   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    Wt Readings from Last 3 Encounters:  12/30/13 236 lb (107.049 kg)  06/16/13 241 lb (109.317 kg)  12/09/12 241 lb 6.4 oz (109.498 kg)     Past Medical History  Diagnosis Date  . BPH (benign prostatic hyperplasia)   . GERD (gastroesophageal reflux disease)   . Perennial allergic rhinitis   . Aortic valve stenosis, moderate     LVH  . Diastolic dysfunction   . Asthma   . Colon polyps   . Colon, diverticulosis   . Hiatal hernia   . ED (erectile dysfunction)   . Heart murmur   . Bronchitis   . Shortness of breath   . Cancer     skin cancer removed in August  . Basal cell adenocarcinoma     Past Surgical History  Procedure Laterality Date  . Knee arthroscopy Bilateral   . Shoulder arthroscopy Right   . Hernia repair Bilateral   . Tonsillectomy    . Appendectomy    . Cardiac catheterization    . Colonoscopy w/ polypectomy    . Aortic valve replacement  11/03/2011    Procedure: AORTIC VALVE REPLACEMENT (AVR);  Surgeon: Melrose Nakayama, MD;  Location: [facility];  Service: Open Heart Surgery;  Laterality: N/A;  Partial Sternotomy  . Sternotomy  11/03/2011    Procedure: STERNOTOMY;  Surgeon: Melrose Nakayama, MD;  Location: [facility];  Service: Open Heart Surgery;  Laterality: N/A;  Partial  . Femoral hernia repair      , x5    Current Outpatient Prescriptions  Medication Sig Dispense Refill  . albuterol (PROVENTIL  HFA;VENTOLIN HFA) 108 (90 BASE) MCG/ACT inhaler Inhale 2 puffs into the lungs every 6 (six) hours as needed. For shortness of breath.    Marland Kitchen aspirin EC 81 MG tablet Take 81 mg by mouth daily.    Marland Kitchen atorvastatin (LIPITOR) 20 MG tablet TAKE 1 TABLET (20 MG TOTAL) BY MOUTH DAILY. 30 tablet 3  . cetirizine (ZYRTEC) 10 MG tablet Take 10 mg by mouth daily.    . Cyanocobalamin (B-12 PO) Take 1 tablet by mouth daily.    . fluticasone (FLONASE) 50 MCG/ACT nasal spray Place 2 sprays into the nose daily.    . Hydrocodone-Acetaminophen 7.5-300 MG TABS     . Multiple Vitamin (MULTIVITAMIN WITH MINERALS) TABS Take 1 tablet by mouth daily.    Marland Kitchen NEXIUM 40 MG capsule Take 40 mg by mouth daily before breakfast.     . sildenafil (VIAGRA) 50 MG tablet Take 50 mg by mouth daily as needed for erectile dysfunction.    . Tamsulosin HCl (FLOMAX) 0.4 MG CAPS Take 0.4 mg by mouth daily after supper.      No current facility-administered medications for this visit.    Allergies:    Allergies  Allergen Reactions  . Penicillins Rash    Social History:  The patient  reports  that he quit smoking about 41 years ago. His smoking use included Cigarettes. He started smoking about 42 years ago. He smoked 0.00 packs per day. His smokeless tobacco use includes Chew. He reports that he drinks alcohol. He reports that he does not use illicit drugs.   ROS:  Please see the history of present illness.   Denies any syncope, bleeding, orthopnea, stroke    PHYSICAL EXAM: VS:  BP 130/88 mmHg  Pulse 79  Ht 6\' 1"  (1.854 m)  Wt 236 lb (107.049 kg)  BMI 31.14 kg/m2 Well nourished, well developed, in no acute distress HEENT: normal Neck: no JVD Cardiac:  normal S1, S2; RRR; 2/6 systolic murmur Lungs:  clear to auscultation bilaterally, no wheezing, rhonchi or rales Abd: soft, nontender, no hepatomegaly Ext: no edema Skin: warm and dry Neuro: no focal abnormalities noted  EKG:  12/30/13-sinus rhythm, normal EKG, nonspecific ST-T  wave changes, heart rate 79-previous Sinus rhythm, 87, nonspecific ST changes    LABS: LDL 103 08/16/13  ASSESSMENT AND PLAN:  1. Aortic valve replacement-doing well. No changes made. He is very kind and considerate. Very thankful. 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 He has been taking dental antibiotics prophylactically. 2. Obesity-encourage weight loss. Low carbohydrate diet. Good job. 3. Diastolic dysfunction-overall doing well. Weight loss. Blood pressure control. 4. CAD - mild 30-40% LAD. Non flow limiting dz.  Monitor for symptoms. ASA.  5. 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 6. Hyperlipidemia-LDL 102  nonobstructive CAD. Did not tolerate atorvastatin well. Aches. He has been using fish oil, diet, exercise. He would like to avoid statins if possible. I will recheck lipid profile off of medication. I explained to him the rationale behind statin, plaque stabilization. 7. 6 month f/u.  Signed, Candee Furbish, MD [facility]  12/30/2013 4:02 PM

## 2013-12-30 NOTE — Patient Instructions (Signed)
The current medical regimen is effective;  continue present plan and medications.  Please have blood work today (Lipid)  Follow up in 6 months with Dr. Marlou Porch.  You will receive a letter in the mail 2 months before you are due.  Please call us when you receive this letter to schedule your follow up appointment.

## 2014-04-07 ENCOUNTER — Ambulatory Visit (INDEPENDENT_AMBULATORY_CARE_PROVIDER_SITE_OTHER): Payer: Medicare Other | Admitting: Cardiology

## 2014-04-07 ENCOUNTER — Encounter: Payer: Self-pay | Admitting: Cardiology

## 2014-04-07 ENCOUNTER — Other Ambulatory Visit: Payer: Self-pay | Admitting: *Deleted

## 2014-04-07 VITALS — BP 128/84 | HR 82 | Ht 73.0 in | Wt 235.0 lb

## 2014-04-07 DIAGNOSIS — R0602 Shortness of breath: Secondary | ICD-10-CM

## 2014-04-07 DIAGNOSIS — E785 Hyperlipidemia, unspecified: Secondary | ICD-10-CM

## 2014-04-07 DIAGNOSIS — I2510 Atherosclerotic heart disease of native coronary artery without angina pectoris: Secondary | ICD-10-CM

## 2014-04-07 DIAGNOSIS — I359 Nonrheumatic aortic valve disorder, unspecified: Secondary | ICD-10-CM

## 2014-04-07 DIAGNOSIS — Z79899 Other long term (current) drug therapy: Secondary | ICD-10-CM

## 2014-04-07 MED ORDER — PRAVASTATIN SODIUM 20 MG PO TABS: 20.0000 mg | ORAL_TABLET | Freq: Every evening | ORAL | Status: DC

## 2014-04-07 NOTE — Patient Instructions (Signed)
Please start Pravastatin 20 mg a day. Continue all other medications as listed.  Your physician has requested that you have an echocardiogram. Echocardiography is a painless test that uses sound waves to create images of your heart. It provides your doctor with information about the size and shape of your heart and how well your heart's chambers and valves are working. This procedure takes approximately one hour. There are no restrictions for this procedure.  Please have fasting lipid and ALT in 3 months.  Follow up with Dr Marlou Porch about 2 days after your lab work.  Thank you for choosing [facility]!!

## 2014-04-07 NOTE — Progress Notes (Signed)
[facility]. [address], [address], [place]  [address] Phone: [phone] Fax:  [phone]  Date:  04/07/2014   ID:  Tim Hardy, DOB 1940-08-25, MRN [id number]  PCP:  Irven Shelling, MD   History of Present Illness: Tim Hardy is a 74 y.o. male (I also take care of his mother) with aortic valve replacement in September of 2013 here for followup.  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 Very appreciative. No CP, . Having a little fatigue. Understands that he is overweight but he is lost 15 pounds he states. He does have some mild shortness of breath with activity. He has not had an echocardiogram since his surgery.  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      Wt Readings from Last 3 Encounters:  04/07/14 235 lb (106.595 kg)  12/30/13 236 lb (107.049 kg)  06/16/13 241 lb (109.317 kg)     Past Medical History  Diagnosis Date  . BPH (benign prostatic hyperplasia)   . GERD (gastroesophageal reflux disease)   . Perennial allergic rhinitis   . Aortic valve stenosis, moderate     LVH  . Diastolic dysfunction   . Asthma   . Colon polyps   . Colon, diverticulosis   . Hiatal hernia   . ED (erectile dysfunction)   . Heart murmur   . Bronchitis   . Shortness of breath   . Cancer     skin cancer removed in August  . Basal cell adenocarcinoma     Past Surgical History  Procedure Laterality Date  . Knee arthroscopy Bilateral   . Shoulder arthroscopy Right   . Hernia repair Bilateral   . Tonsillectomy    . Appendectomy    . Cardiac catheterization    . Colonoscopy w/ polypectomy    . Aortic valve replacement  11/03/2011    Procedure: AORTIC VALVE REPLACEMENT (AVR);  Surgeon: Melrose Nakayama, MD;  Location: [facility];  Service: Open Heart Surgery;  Laterality: N/A;  Partial Sternotomy  . Sternotomy  11/03/2011    Procedure: STERNOTOMY;  Surgeon: Melrose Nakayama, MD;  Location: [facility];  Service: Open Heart Surgery;   Laterality: N/A;  Partial  . Femoral hernia repair      , x5    Current Outpatient Prescriptions  Medication Sig Dispense Refill  . albuterol (PROVENTIL HFA;VENTOLIN HFA) 108 (90 BASE) MCG/ACT inhaler Inhale 2 puffs into the lungs every 6 (six) hours as needed. For shortness of breath.    Marland Kitchen aspirin EC 81 MG tablet Take 81 mg by mouth daily.    . cetirizine (ZYRTEC) 10 MG tablet Take 10 mg by mouth daily.    . Cyanocobalamin (B-12 PO) Take 1 tablet by mouth daily.    . fluticasone (FLONASE) 50 MCG/ACT nasal spray Place 2 sprays into the nose daily.    . Hydrocodone-Acetaminophen 7.5-300 MG TABS     . Multiple Vitamin (MULTIVITAMIN WITH MINERALS) TABS Take 1 tablet by mouth daily.    Marland Kitchen NEXIUM 40 MG capsule Take 40 mg by mouth daily before breakfast.     . sildenafil (VIAGRA) 50 MG tablet Take 50 mg by mouth daily as needed for erectile dysfunction.    . Tamsulosin HCl (FLOMAX) 0.4 MG CAPS Take 0.4 mg by mouth daily after supper.      No current facility-administered medications for this visit.    Allergies:    Allergies  Allergen Reactions  .  Penicillins Rash    Social History:  The patient  reports that he quit smoking about 42 years ago. His smoking use included Cigarettes. He started smoking about 43 years ago. His smokeless tobacco use includes Chew. He reports that he drinks alcohol. He reports that he does not use illicit drugs.   ROS:  Please see the history of present illness.   Denies any syncope, bleeding, orthopnea, stroke    PHYSICAL EXAM: VS:  BP 128/84 mmHg  Pulse 82  Ht 6\' 1"  (1.854 m)  Wt 235 lb (106.595 kg)  BMI 31.01 kg/m2 Well nourished, well developed, in no acute distress HEENT: normal Neck: no JVD Cardiac:  normal S1, S2; RRR; 2/6 systolic murmur Lungs:  clear to auscultation bilaterally, no wheezing, rhonchi or rales Abd: soft, nontender, no hepatomegalyOverweight Ext: no edema Skin: warm and dry Neuro: no focal abnormalities noted  EKG:   12/30/13-sinus rhythm, normal EKG, nonspecific ST-T wave changes, heart rate 79-previous Sinus rhythm, 87, nonspecific ST changes     LABS: LDL 103 08/16/13  ASSESSMENT AND PLAN:  1. Aortic valve replacement-having mild dyspnea. We will check an echocardiogram to evaluate status of bioprosthetic valve. No changes made. He is very kind and considerate. Very thankful. 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 He has been taking dental antibiotics prophylactically. 2. Obesity-encourage weight loss. Low carbohydrate diet. Good job. 3. Diastolic dysfunction-overall doing well. Weight loss. Blood pressure control. 4. CAD - mild 30-40% LAD. Non flow limiting dz.  Monitor for symptoms. ASA. We will go ahead and start pravastatin 20 mg. 5. 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 6. Hyperlipidemia-I will go ahead and start pravastatin 20 mg once a day. LDL 102  nonobstructive CAD. Did not tolerate atorvastatin well. Aches. He has been using fish oil, diet, exercise. We will check lab work in 3 months, ALT 7. 3 month f/u.  Signed, Candee Furbish, MD [facility]  04/07/2014 2:28 PM

## 2014-04-17 ENCOUNTER — Ambulatory Visit (HOSPITAL_COMMUNITY): Payer: Medicare Other | Attending: Cardiology | Admitting: Radiology

## 2014-04-17 DIAGNOSIS — I359 Nonrheumatic aortic valve disorder, unspecified: Secondary | ICD-10-CM | POA: Diagnosis not present

## 2014-04-17 DIAGNOSIS — R0602 Shortness of breath: Secondary | ICD-10-CM | POA: Diagnosis not present

## 2014-04-17 NOTE — Progress Notes (Signed)
Echocardiogram performed.  

## 2014-04-19 ENCOUNTER — Other Ambulatory Visit: Payer: Self-pay | Admitting: Gastroenterology

## 2014-06-15 ENCOUNTER — Other Ambulatory Visit: Payer: Self-pay | Admitting: Physician Assistant

## 2014-07-04 ENCOUNTER — Encounter (HOSPITAL_COMMUNITY): Payer: Self-pay | Admitting: *Deleted

## 2014-07-11 ENCOUNTER — Ambulatory Visit (HOSPITAL_COMMUNITY): Payer: Medicare Other | Admitting: Anesthesiology

## 2014-07-11 ENCOUNTER — Encounter (HOSPITAL_COMMUNITY): Payer: Self-pay

## 2014-07-11 ENCOUNTER — Encounter (HOSPITAL_COMMUNITY)
Admission: RE | Disposition: A | Discharge status: Home / Self Care | Payer: Self-pay | Source: Ambulatory Visit | Attending: Gastroenterology

## 2014-07-11 ENCOUNTER — Ambulatory Visit (HOSPITAL_COMMUNITY)
Admission: RE | Admit: 2014-07-11 | Discharge: 2014-07-11 | Disposition: A | Discharge status: Home / Self Care | Payer: Medicare Other | Source: Ambulatory Visit | Attending: Gastroenterology | Admitting: Gastroenterology

## 2014-07-11 DIAGNOSIS — Z952 Presence of prosthetic heart valve: Secondary | ICD-10-CM | POA: Insufficient documentation

## 2014-07-11 DIAGNOSIS — Z87891 Personal history of nicotine dependence: Secondary | ICD-10-CM | POA: Diagnosis not present

## 2014-07-11 DIAGNOSIS — D122 Benign neoplasm of ascending colon: Secondary | ICD-10-CM | POA: Insufficient documentation

## 2014-07-11 DIAGNOSIS — K5730 Diverticulosis of large intestine without perforation or abscess without bleeding: Secondary | ICD-10-CM | POA: Diagnosis not present

## 2014-07-11 DIAGNOSIS — Z800 Family history of malignant neoplasm of digestive organs: Secondary | ICD-10-CM | POA: Diagnosis not present

## 2014-07-11 DIAGNOSIS — Z1211 Encounter for screening for malignant neoplasm of colon: Secondary | ICD-10-CM | POA: Diagnosis not present

## 2014-07-11 DIAGNOSIS — Z8546 Personal history of malignant neoplasm of prostate: Secondary | ICD-10-CM | POA: Diagnosis not present

## 2014-07-11 DIAGNOSIS — K219 Gastro-esophageal reflux disease without esophagitis: Secondary | ICD-10-CM | POA: Diagnosis present

## 2014-07-11 DIAGNOSIS — J45909 Unspecified asthma, uncomplicated: Secondary | ICD-10-CM | POA: Diagnosis not present

## 2014-07-11 HISTORY — PX: COLONOSCOPY WITH PROPOFOL: SHX5780

## 2014-07-11 HISTORY — PX: ESOPHAGOGASTRODUODENOSCOPY (EGD) WITH PROPOFOL: SHX5813

## 2014-07-11 SURGERY — COLONOSCOPY WITH PROPOFOL
Anesthesia: Monitor Anesthesia Care

## 2014-07-11 MED ORDER — BUTAMBEN-TETRACAINE-BENZOCAINE 2-2-14 % EX AERO
INHALATION_SPRAY | CUTANEOUS | Status: DC | PRN
  Administered 2014-07-11: 1 via TOPICAL

## 2014-07-11 MED ORDER — PROPOFOL 10 MG/ML IV BOLUS
INTRAVENOUS | Status: AC
  Filled 2014-07-11: qty 20

## 2014-07-11 MED ORDER — LACTATED RINGERS IV SOLN
INTRAVENOUS | Status: DC
  Administered 2014-07-11: 1000 mL via INTRAVENOUS

## 2014-07-11 MED ORDER — PROPOFOL INFUSION 10 MG/ML OPTIME
INTRAVENOUS | Status: DC | PRN
  Administered 2014-07-11: 300 ug/kg/min via INTRAVENOUS

## 2014-07-11 MED ORDER — SODIUM CHLORIDE 0.9 % IV SOLN: INTRAVENOUS | Status: DC

## 2014-07-11 SURGICAL SUPPLY — 25 items

## 2014-07-11 NOTE — H&P (Signed)
  Procedures: Screening colonoscopy and diagnostic esophagogastroduodenoscopy. Father diagnosed with colon cancer at age 73. Chronic gastroesophageal reflux treated with proton pump inhibitor therapy.  History: The patient is a 74 year old male born [birth year]. His father was diagnosed with colon cancer at age 21. In 2004, he underwent a screening colonoscopy with removal of a 7 mm hyperplastic ascending colon polyp. In 2011, he underwent a normal screening colonoscopy. He has chronic gastroesophageal reflux unassociated with dysphagia or odynophagia.  He is scheduled to undergo screening colonoscopy and diagnostic esophagogastroduodenoscopy.  Past medical history: Adenocarcinoma of the prostate. Gastroesophageal reflux. Seasonal allergic rhinitis. Aortic valve replacement surgery with tissue valve to treat aortic stenosis. Asthma. Colonic diverticulosis. Herniorrhaphies. Left knee surgery. Right knee surgery. Shoulder surgery.  Medication allergies: Penicillin  Exam: The patient is alert and lying comfortably on the endoscopy stretcher. Abdomen is soft and nontender to palpation. Lungs are clear to auscultation. Cardiac exam reveals a regular rhythm.  Plan: Proceed with diagnostic esophagogastroduodenoscopy and screening colonoscopy

## 2014-07-11 NOTE — Anesthesia Postprocedure Evaluation (Signed)
  Anesthesia Post-op Note  Patient: Marco Raper  Procedure(s) Performed: Procedure(s) (LRB): COLONOSCOPY WITH PROPOFOL (N/A) ESOPHAGOGASTRODUODENOSCOPY (EGD) WITH PROPOFOL (N/A)  Patient Location: PACU  Anesthesia Type: MAC  Level of Consciousness: awake and alert   Airway and Oxygen Therapy: Patient Spontanous Breathing  Post-op Pain: mild  Post-op Assessment: Post-op Vital signs reviewed, Patient's Cardiovascular Status Stable, Respiratory Function Stable, Patent Airway and No signs of Nausea or vomiting  Last Vitals:  Filed Vitals:   07/11/14 1130  BP: 122/65  Pulse: 61  Temp:   Resp: 18    Post-op Vital Signs: stable   Complications: No apparent anesthesia complications

## 2014-07-11 NOTE — Transfer of Care (Signed)
Immediate Anesthesia Transfer of Care Note  Patient: Tim Hardy  Procedure(s) Performed: Procedure(s): COLONOSCOPY WITH PROPOFOL (N/A) ESOPHAGOGASTRODUODENOSCOPY (EGD) WITH PROPOFOL (N/A)  Patient Location: PACU and Endoscopy Unit  Anesthesia Type:MAC  Level of Consciousness: sedated and patient cooperative  Airway & Oxygen Therapy: Patient Spontanous Breathing and Patient connected to nasal cannula oxygen  Post-op Assessment: Report given to RN and Post -op Vital signs reviewed and stable  Post vital signs: Reviewed and stable  Last Vitals:  Filed Vitals:   07/11/14 0920  BP: 147/84  Pulse: 58  Temp: 36.7 C  Resp: 13    Complications: No apparent anesthesia complications

## 2014-07-11 NOTE — Anesthesia Procedure Notes (Addendum)
Procedure Name: MAC Date/Time: 07/11/2014 10:33 AM Performed by: Dione Booze Pre-anesthesia Checklist: Emergency Drugs available, Patient identified, Suction available and Patient being monitored Patient Re-evaluated:Patient Re-evaluated prior to inductionOxygen Delivery Method: Nasal cannula Placement Confirmation: positive ETCO2

## 2014-07-11 NOTE — Discharge Instructions (Signed)

## 2014-07-11 NOTE — Anesthesia Preprocedure Evaluation (Signed)
Anesthesia Evaluation  Patient identified by MRN, date of birth, ID band Patient awake    Reviewed: Allergy & Precautions, NPO status , Patient's Chart, lab work & pertinent test results  Airway Mallampati: II  TM Distance: >3 FB Neck ROM: Full    Dental no notable dental hx.    Pulmonary neg pulmonary ROS, asthma , former smoker,  breath sounds clear to auscultation  Pulmonary exam normal       Cardiovascular Normal cardiovascular examRhythm:Regular Rate:Normal  S/p AVR 2014   Neuro/Psych negative neurological ROS  negative psych ROS   GI/Hepatic negative GI ROS, Neg liver ROS,   Endo/Other  negative endocrine ROS  Renal/GU negative Renal ROS  negative genitourinary   Musculoskeletal negative musculoskeletal ROS (+)   Abdominal   Peds negative pediatric ROS (+)  Hematology negative hematology ROS (+)   Anesthesia Other Findings   Reproductive/Obstetrics negative OB ROS                             Anesthesia Physical Anesthesia Plan  ASA: II  Anesthesia Plan: MAC   Post-op Pain Management:    Induction:   Airway Management Planned: Simple Face Mask  Additional Equipment:   Intra-op Plan:   Post-operative Plan:   Informed Consent: I have reviewed the patients History and Physical, chart, labs and discussed the procedure including the risks, benefits and alternatives for the proposed anesthesia with the patient or authorized representative who has indicated his/her understanding and acceptance.   Dental advisory given  Plan Discussed with: CRNA  Anesthesia Plan Comments:         Anesthesia Quick Evaluation

## 2014-07-11 NOTE — Op Note (Signed)
Procedure: Diagnostic esophagogastroduodenoscopy to evaluate chronic gastroesophageal reflux  Endoscopist: Earle Gell  Premedication: Propofol administered by anesthesia  Procedure: The patient was placed in the left lateral decubitus position. The Pentax gastroscope was passed through the posterior hypopharynx into the proximal esophagus without difficulty. The hypopharynx, larynx, and vocal cords appeared normal.  Esophagoscopy: The proximal, mid, and lower segments of the esophageal mucosa appeared normal. The squamocolumnar junction was noted at approximately 40 cm from the incisor teeth. There was no endoscopic evidence for the presence of Barrett's esophagus, erosive esophagitis, or esophageal stricture formation.  Gastroscopy: Retroflex view of the gastric cardia and fundus was normal. The gastric body, antrum, and pylorus appeared normal.  Duodenoscopy: The duodenal bulb and descending duodenum appeared normal.  Assessment: Normal esophagogastroduodenoscopy  Procedure: Screening colonoscopy Anal inspection and digital rectal exam were normal. The Pentax pediatric colonoscope was introduced into the rectum and advanced to the cecum. A normal-appearing appendiceal orifice and ileocecal valve were identified. Colonic preparation for the exam today was good. Withdrawal time was 22 minutes  Rectum. Normal. Retroflexed view of the distal rectum was normal  Sigmoid colon and descending colon. Left colonic diverticulosis  Splenic flexure. Normal  Transverse colon. Normal  Hepatic flexure. Normal  Ascending colon. A 4 mm sessile polyp was removed with the cold snare from the distal ascending colon. A 6 mm sessile polyp was removed from the proximal ascending colon with cold snare. An Endo Clip was applied to the polypectomy site to stop post polypectomy bleeding.  Cecum and ileocecal valve. Normal  Assessment: Two small polyps were removed from the ascending colon. Otherwise  normal screening colonoscopy.  Recommendation: High will evaluate the polyp pathology to determine when the patient should undergo a repeat colonoscopy

## 2014-07-13 ENCOUNTER — Encounter (HOSPITAL_COMMUNITY): Payer: Self-pay | Admitting: Gastroenterology

## 2014-07-17 ENCOUNTER — Other Ambulatory Visit (INDEPENDENT_AMBULATORY_CARE_PROVIDER_SITE_OTHER): Payer: Medicare Other | Admitting: *Deleted

## 2014-07-17 DIAGNOSIS — E785 Hyperlipidemia, unspecified: Secondary | ICD-10-CM

## 2014-07-17 DIAGNOSIS — Z79899 Other long term (current) drug therapy: Secondary | ICD-10-CM

## 2014-07-17 LAB — LIPID PANEL
CHOL/HDL RATIO: 5
Cholesterol: 152 mg/dL (ref 0–200)
HDL: 27.7 mg/dL — ABNORMAL LOW (ref >39.00)
LDL Cholesterol: 85 mg/dL (ref 0–99)
NonHDL: 124.3
Triglycerides: 197 mg/dL — ABNORMAL HIGH (ref 0.0–149.0)
VLDL: 39.4 mg/dL (ref 0.0–40.0)

## 2014-07-17 LAB — ALT: ALT: 20 U/L (ref 0–53)

## 2014-07-17 NOTE — Addendum Note (Signed)
Addended by: Tyashia Morrisette K on: 07/17/2014 07:59 AM   Modules accepted: Orders  

## 2014-07-19 ENCOUNTER — Ambulatory Visit (INDEPENDENT_AMBULATORY_CARE_PROVIDER_SITE_OTHER): Payer: Medicare Other | Admitting: Cardiology

## 2014-07-19 ENCOUNTER — Encounter: Payer: Self-pay | Admitting: Cardiology

## 2014-07-19 VITALS — BP 122/70 | HR 96 | Ht 73.0 in | Wt 231.1 lb

## 2014-07-19 DIAGNOSIS — Z79899 Other long term (current) drug therapy: Secondary | ICD-10-CM

## 2014-07-19 DIAGNOSIS — Z954 Presence of other heart-valve replacement: Secondary | ICD-10-CM

## 2014-07-19 DIAGNOSIS — E7800 Pure hypercholesterolemia, unspecified: Secondary | ICD-10-CM

## 2014-07-19 DIAGNOSIS — I2510 Atherosclerotic heart disease of native coronary artery without angina pectoris: Secondary | ICD-10-CM | POA: Diagnosis not present

## 2014-07-19 DIAGNOSIS — Z952 Presence of prosthetic heart valve: Secondary | ICD-10-CM

## 2014-07-19 MED ORDER — ROSUVASTATIN CALCIUM 5 MG PO TABS: 5.0000 mg | ORAL_TABLET | Freq: Every day | ORAL | Status: DC

## 2014-07-19 NOTE — Patient Instructions (Signed)
Medication Instructions:  Please stop Pravastatin and start Crestor 5 mg a day.  Continue all other medications as listed.  Labwork: Please have a fasting lipid and ALT in 3 months before seeing Dr Marlou Porch.  Follow-Up: Follow up in 3 months with Dr Marlou Porch.  Thank you for choosing [facility]!!

## 2014-07-19 NOTE — Progress Notes (Signed)
[facility]. [address] [place], [place]  [address] Phone: [phone] Fax:  [phone]  Date:  07/19/2014   ID:  Tim Hardy, DOB 1940-02-07, MRN [id number]  PCP:  Irven Shelling, MD   History of Present Illness: Tim Hardy is a 74 y.o. male (I also take care of his mother) with aortic valve replacement in September of 2013 here for followup.  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 Very appreciative. No CP, . Having a little fatigue. Understands that he is overweight but he is lost 15 pounds he states. He does have some mild shortness of breath with activity.    04/17/14 echocardiogram reassuring, reassuring bioprosthetic aortic valve.  Cramps with Simvastatin/ Atorvastatin. Did not start Pravastatin. Willing to try Crestor.   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      Wt Readings from Last 3 Encounters:  07/19/14 231 lb 1.9 oz (104.835 kg)  07/11/14 225 lb (102.059 kg)  04/07/14 235 lb (106.595 kg)     Past Medical History  Diagnosis Date  . BPH (benign prostatic hyperplasia)   . GERD (gastroesophageal reflux disease)   . Perennial allergic rhinitis   . Aortic valve stenosis, moderate     LVH  . Diastolic dysfunction   . Asthma   . Colon polyps   . Colon, diverticulosis   . Hiatal hernia   . ED (erectile dysfunction)   . Heart murmur   . Bronchitis   . Shortness of breath   . Cancer     skin cancer removed in August  . Basal cell adenocarcinoma     Past Surgical History  Procedure Laterality Date  . Knee arthroscopy Bilateral   . Shoulder arthroscopy Right   . Hernia repair Bilateral   . Tonsillectomy    . Appendectomy    . Cardiac catheterization    . Colonoscopy w/ polypectomy    . Aortic valve replacement  11/03/2011    Procedure: AORTIC VALVE REPLACEMENT (AVR);  Surgeon: Melrose Nakayama, MD;  Location: [facility];  Service: Open Heart Surgery;  Laterality: N/A;  Partial Sternotomy  . Sternotomy  11/03/2011    Procedure: STERNOTOMY;   Surgeon: Melrose Nakayama, MD;  Location: [facility];  Service: Open Heart Surgery;  Laterality: N/A;  Partial  . Femoral hernia repair      , x5  . Colonoscopy with propofol N/A 07/11/2014    Procedure: COLONOSCOPY WITH PROPOFOL;  Surgeon: Garlan Fair, MD;  Location[facility];  Service: Endoscopy;  Laterality: N/A;  . Esophagogastroduodenoscopy (egd) with propofol N/A 07/11/2014    Procedure: ESOPHAGOGASTRODUODENOSCOPY (EGD) WITH PROPOFOL;  Surgeon: Garlan Fair, MD;  Location: [facility];  Service: Endoscopy;  Laterality: N/A;    Current Outpatient Prescriptions  Medication Sig Dispense Refill  . aspirin EC 81 MG tablet Take 81 mg by mouth every morning.     . cetirizine (ZYRTEC) 10 MG tablet Take 10 mg by mouth every morning.     . Cholecalciferol (VITAMIN D PO) Take 1 tablet by mouth every morning.    . Cyanocobalamin (B-12 PO) Take 1 tablet by mouth every morning.     . fluticasone (FLONASE) 50 MCG/ACT nasal spray Place 2 sprays into the nose daily as needed for allergies.     Marland Kitchen HYDROcodone-homatropine (HYCODAN) 5-1.5 MG/5ML syrup Take 5 mLs by mouth every 6 (six) hours as needed for cough.    . Multiple Vitamin (MULTIVITAMIN WITH MINERALS) TABS Take  1 tablet by mouth every morning.     . naproxen sodium (ANAPROX) 220 MG tablet Take 220 mg by mouth 2 (two) times daily as needed (pain).    Marland Kitchen NEXIUM 40 MG capsule Take 40 mg by mouth daily before breakfast.     . Omega-3 Fatty Acids (FISH OIL PO) Take 1 capsule by mouth every morning.    . pravastatin (PRAVACHOL) 20 MG tablet Take 1 tablet (20 mg total) by mouth every evening. 30 tablet 11  . sildenafil (VIAGRA) 50 MG tablet Take 50 mg by mouth daily as needed for erectile dysfunction.    . Tamsulosin HCl (FLOMAX) 0.4 MG CAPS Take 0.4 mg by mouth daily after supper.      No current facility-administered medications for this visit.    Allergies:    Allergies  Allergen Reactions  . Penicillins Rash    Social History:  The  patient  reports that he quit smoking about 42 years ago. His smoking use included Cigarettes. He started smoking about 43 years ago. His smokeless tobacco use includes Chew. He reports that he drinks alcohol. He reports that he does not use illicit drugs.   ROS:  Please see the history of present illness.   Denies any syncope, bleeding, orthopnea, stroke    PHYSICAL EXAM: VS:  BP 122/70 mmHg  Pulse 96  Ht 6\' 1"  (1.854 m)  Wt 231 lb 1.9 oz (104.835 kg)  BMI 30.50 kg/m2 Well nourished, well developed, in no acute distress HEENT: normal Neck: no JVD Cardiac:  normal S1, S2; RRR; 2/6 systolic murmur Lungs:  clear to auscultation bilaterally, no wheezing, rhonchi or rales Abd: soft, nontender, no hepatomegalyOverweight Ext: no edema Skin: warm and dry Neuro: no focal abnormalities noted  EKG:  12/30/13-sinus rhythm, normal EKG, nonspecific ST-T wave changes, heart rate 79-previous Sinus rhythm, 87, nonspecific ST changes     LABS: LDL 103 08/16/13  ASSESSMENT AND PLAN:  1. Aortic valve replacement-having mild dyspnea. We will check an echocardiogram to evaluate status of bioprosthetic valve. No changes made. He is very kind and considerate. Very thankful. 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 He has been taking dental antibiotics prophylactically. 2. Obesity-encourage weight loss. Low carbohydrate diet. Good job. 3. Diastolic dysfunction-overall doing well. Weight loss. Blood pressure control. 4. CAD - mild 30-40% LAD. Non flow limiting dz.  Monitor for symptoms. ASA.  5. 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 6. Hyperlipidemia he did not start his pravastatin. He is willing to try Crestor 5 mg once a day. LDL 102  nonobstructive CAD. Did not tolerate atorvastatin well. Aches. He has been using fish oil, diet, exercise. We will check lab work in 3 months, ALT 7. 3 month f/u.  Signed, Candee Furbish, MD [facility]  07/19/2014 2:41 PM

## 2014-07-20 ENCOUNTER — Encounter: Payer: Self-pay | Admitting: *Deleted

## 2014-07-20 ENCOUNTER — Telehealth: Payer: Self-pay | Admitting: Cardiology

## 2014-07-20 NOTE — Telephone Encounter (Signed)
New problem    Pt returning calling from nurse.

## 2014-07-20 NOTE — Telephone Encounter (Signed)
Reviewed results of lab work again with pt who states understanding.  He started Crestor 5 mg last night and has not had a problem.

## 2014-10-18 ENCOUNTER — Other Ambulatory Visit (INDEPENDENT_AMBULATORY_CARE_PROVIDER_SITE_OTHER): Payer: Medicare Other | Admitting: *Deleted

## 2014-10-18 DIAGNOSIS — E7800 Pure hypercholesterolemia, unspecified: Secondary | ICD-10-CM

## 2014-10-18 DIAGNOSIS — Z79899 Other long term (current) drug therapy: Secondary | ICD-10-CM | POA: Diagnosis not present

## 2014-10-18 LAB — LIPID PANEL
CHOL/HDL RATIO: 3
Cholesterol: 116 mg/dL (ref 0–200)
HDL: 45.1 mg/dL (ref >39.00)
LDL Cholesterol: 54 mg/dL (ref 0–99)
NONHDL: 70.64
TRIGLYCERIDES: 82 mg/dL (ref 0.0–149.0)
VLDL: 16.4 mg/dL (ref 0.0–40.0)

## 2014-10-18 LAB — ALT: ALT: 25 U/L (ref 0–53)

## 2014-10-18 NOTE — Addendum Note (Signed)
Addended by: Eulis Foster on: 10/18/2014 07:42 AM   Modules accepted: Orders

## 2014-10-20 ENCOUNTER — Ambulatory Visit (INDEPENDENT_AMBULATORY_CARE_PROVIDER_SITE_OTHER): Payer: Medicare Other | Admitting: Cardiology

## 2014-10-20 ENCOUNTER — Encounter: Payer: Self-pay | Admitting: Cardiology

## 2014-10-20 VITALS — BP 136/86 | HR 90 | Ht 73.0 in | Wt 223.8 lb

## 2014-10-20 DIAGNOSIS — I359 Nonrheumatic aortic valve disorder, unspecified: Secondary | ICD-10-CM | POA: Diagnosis not present

## 2014-10-20 DIAGNOSIS — E7800 Pure hypercholesterolemia, unspecified: Secondary | ICD-10-CM

## 2014-10-20 DIAGNOSIS — C61 Malignant neoplasm of prostate: Secondary | ICD-10-CM

## 2014-10-20 DIAGNOSIS — Z954 Presence of other heart-valve replacement: Secondary | ICD-10-CM

## 2014-10-20 DIAGNOSIS — I2510 Atherosclerotic heart disease of native coronary artery without angina pectoris: Secondary | ICD-10-CM

## 2014-10-20 DIAGNOSIS — E669 Obesity, unspecified: Secondary | ICD-10-CM

## 2014-10-20 DIAGNOSIS — Z952 Presence of prosthetic heart valve: Secondary | ICD-10-CM

## 2014-10-20 NOTE — Patient Instructions (Signed)
Medication Instructions:  Your physician recommends that you continue on your current medications as directed. Please refer to the Current Medication list given to you today.  Labwork: NONE  Testing/Procedures: NONE  Follow-Up: Your physician wants you to follow-up in: 6 months with Dr. Marlou Porch. You will receive a reminder letter in the mail two months in advance. If you don't receive a letter, please call our office to schedule the follow-up appointment.

## 2014-10-20 NOTE — Progress Notes (Signed)
[facility]. [address], [address] [place], [place]  [address] Phone: [phone] Fax:  [phone]  Date:  10/20/2014   ID:  Tim Hardy, DOB 1940/04/28, MRN [id number]  PCP:  Irven Shelling, MD   History of Present Illness: Tim Hardy is a 74 y.o. male (I also take care of his mother) with aortic valve replacement in September of 2013 here for followup.  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 Very appreciative. No CP, . Having a little fatigue. Understands that he is overweight but he is lost 15 pounds he states. He does have some mild shortness of breath with activity.    04/17/14 echocardiogram reassuring, reassuring bioprosthetic aortic valve.  Cramps with Simvastatin/ Atorvastatin. Did not start Pravastatin.   Atorvastatin able to tolerate 5mg  QD only. Doing well.   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      Wt Readings from Last 3 Encounters:  10/20/14 223 lb 12.8 oz (101.515 kg)  07/19/14 231 lb 1.9 oz (104.835 kg)  07/11/14 225 lb (102.059 kg)     Past Medical History  Diagnosis Date  . BPH (benign prostatic hyperplasia)   . GERD (gastroesophageal reflux disease)   . Perennial allergic rhinitis   . Aortic valve stenosis, moderate     LVH  . Diastolic dysfunction   . Asthma   . Colon polyps   . Colon, diverticulosis   . Hiatal hernia   . ED (erectile dysfunction)   . Heart murmur   . Bronchitis   . Shortness of breath   . Cancer     skin cancer removed in August  . Basal cell adenocarcinoma     Past Surgical History  Procedure Laterality Date  . Knee arthroscopy Bilateral   . Shoulder arthroscopy Right   . Hernia repair Bilateral   . Tonsillectomy    . Appendectomy    . Cardiac catheterization    . Colonoscopy w/ polypectomy    . Aortic valve replacement  11/03/2011    Procedure: AORTIC VALVE REPLACEMENT (AVR);  Surgeon: Melrose Nakayama, MD;  Location: [facility];  Service: Open Heart Surgery;  Laterality: N/A;  Partial Sternotomy  .  Sternotomy  11/03/2011    Procedure: STERNOTOMY;  Surgeon: Melrose Nakayama, MD;  Location: [facility];  Service: Open Heart Surgery;  Laterality: N/A;  Partial  . Femoral hernia repair      , x5  . Colonoscopy with propofol N/A 07/11/2014    Procedure: COLONOSCOPY WITH PROPOFOL;  Surgeon: Garlan Fair, MD;  [facility];  Service: Endoscopy;  Laterality: N/A;  . Esophagogastroduodenoscopy (egd) with propofol N/A 07/11/2014    Procedure: ESOPHAGOGASTRODUODENOSCOPY (EGD) WITH PROPOFOL;  Surgeon: Garlan Fair, MD;  [facility];  Service: Endoscopy;  Laterality: N/A;    Current Outpatient Prescriptions  Medication Sig Dispense Refill  . aspirin EC 81 MG tablet Take 81 mg by mouth every morning.     Marland Kitchen atorvastatin (LIPITOR) 10 MG tablet Take 10 mg by mouth daily.    . cetirizine (ZYRTEC) 10 MG tablet Take 10 mg by mouth every morning.     . Cholecalciferol (VITAMIN D PO) Take 1 tablet by mouth every morning.    . Cyanocobalamin (B-12 PO) Take 1 tablet by mouth every morning.     . fluticasone (FLONASE) 50 MCG/ACT nasal spray Place 2 sprays into the nose daily as needed for allergies.     Marland Kitchen HYDROcodone-homatropine (HYCODAN) 5-1.5 MG/5ML syrup  Take 5 mLs by mouth every 6 (six) hours as needed for cough.    . Multiple Vitamin (MULTIVITAMIN WITH MINERALS) TABS Take 1 tablet by mouth every morning.     . naproxen sodium (ANAPROX) 220 MG tablet Take 220 mg by mouth 2 (two) times daily as needed (pain).    Marland Kitchen NEXIUM 40 MG capsule Take 40 mg by mouth daily before breakfast.     . Omega-3 Fatty Acids (FISH OIL PO) Take 1 capsule by mouth every morning.    . sildenafil (VIAGRA) 50 MG tablet Take 50 mg by mouth daily as needed for erectile dysfunction.    . Tamsulosin HCl (FLOMAX) 0.4 MG CAPS Take 0.4 mg by mouth daily after supper.      No current facility-administered medications for this visit.    Allergies:    Allergies  Allergen Reactions  . Penicillins Rash    Social  History:  The patient  reports that he quit smoking about 42 years ago. His smoking use included Cigarettes. He started smoking about 43 years ago. His smokeless tobacco use includes Chew. He reports that he drinks alcohol. He reports that he does not use illicit drugs.   ROS:  Please see the history of present illness.   Denies any syncope, bleeding, orthopnea, stroke    PHYSICAL EXAM: VS:  BP 136/86 mmHg  Pulse 90  Ht 6\' 1"  (1.854 m)  Wt 223 lb 12.8 oz (101.515 kg)  BMI 29.53 kg/m2  SpO2 95% Well nourished, well developed, in no acute distress HEENT: normal Neck: no JVD Cardiac:  normal S1, S2; RRR; 2/6 systolic murmur Lungs:  clear to auscultation bilaterally, no wheezing, rhonchi or rales Abd: soft, nontender, no hepatomegalyOverweight Ext: no edema Skin: warm and dry Neuro: no focal abnormalities noted  EKG:  12/30/13-sinus rhythm, normal EKG, nonspecific ST-T wave changes, heart rate 79-previous Sinus rhythm, 87, nonspecific ST changes     LABS: LDL 54, much improved  Echo: 04/17/14 Normal ejection fraction, normal bioprosthetic aortic valve.  ASSESSMENT AND PLAN:  1. Aortic valve replacement-. ECHO reassuring. No changes made. He is very kind and considerate. Very thankful. 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 He has been taking dental antibiotics prophylactically. 2. Obesity-encourage weight loss. Low carbohydrate diet. Good job. He is down a few pounds 3. Diastolic dysfunction-overall doing well. Weight loss. Blood pressure control. 4. CAD - mild 30-40% LAD. Non flow limiting dz.  Monitor for symptoms. ASA. 81 mg 5. 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 6. Hyperlipidemia -he is taking half of the atorvastatin prescribed which I believe may be 5 mg. Continue. 7. 6 month f/u.  Signed, Candee Furbish, MD [facility]  10/20/2014 2:03 PM

## 2014-10-24 ENCOUNTER — Telehealth: Payer: Self-pay | Admitting: Cardiology

## 2014-10-24 NOTE — Telephone Encounter (Signed)
PT AWARE OF LAB RESULTS./CY 

## 2014-10-24 NOTE — Telephone Encounter (Signed)
NO ANSWER WENT TO VOICE MAIL .Tim Hardy

## 2014-10-24 NOTE — Telephone Encounter (Signed)
New Message  Pt returning RN phone call. Please call back and discuss.   

## 2015-04-16 ENCOUNTER — Other Ambulatory Visit (INDEPENDENT_AMBULATORY_CARE_PROVIDER_SITE_OTHER): Payer: Medicare Other | Admitting: *Deleted

## 2015-04-16 DIAGNOSIS — E7800 Pure hypercholesterolemia, unspecified: Secondary | ICD-10-CM

## 2015-04-16 LAB — ALT: ALT: 23 U/L (ref 9–46)

## 2015-04-16 LAB — LIPID PANEL
Cholesterol: 114 mg/dL — ABNORMAL LOW (ref 125–200)
HDL: 34 mg/dL — ABNORMAL LOW (ref >=40)
LDL Cholesterol: 61 mg/dL (ref <130)
TRIGLYCERIDES: 93 mg/dL (ref <150)
Total CHOL/HDL Ratio: 3.4 Ratio (ref <=5.0)
VLDL: 19 mg/dL (ref <30)

## 2015-04-16 NOTE — Addendum Note (Signed)
Addended by: Eulis Foster on: 04/16/2015 08:08 AM   Modules accepted: Orders

## 2015-04-18 ENCOUNTER — Encounter: Payer: Self-pay | Admitting: Cardiology

## 2015-04-18 ENCOUNTER — Ambulatory Visit (INDEPENDENT_AMBULATORY_CARE_PROVIDER_SITE_OTHER): Payer: Medicare Other | Admitting: Cardiology

## 2015-04-18 VITALS — BP 150/86 | HR 80 | Ht 73.0 in | Wt 234.0 lb

## 2015-04-18 DIAGNOSIS — Z79899 Other long term (current) drug therapy: Secondary | ICD-10-CM | POA: Diagnosis not present

## 2015-04-18 DIAGNOSIS — I350 Nonrheumatic aortic (valve) stenosis: Secondary | ICD-10-CM | POA: Diagnosis not present

## 2015-04-18 DIAGNOSIS — Z952 Presence of prosthetic heart valve: Secondary | ICD-10-CM

## 2015-04-18 DIAGNOSIS — Z954 Presence of other heart-valve replacement: Secondary | ICD-10-CM | POA: Diagnosis not present

## 2015-04-18 DIAGNOSIS — I359 Nonrheumatic aortic valve disorder, unspecified: Secondary | ICD-10-CM

## 2015-04-18 DIAGNOSIS — E669 Obesity, unspecified: Secondary | ICD-10-CM

## 2015-04-18 DIAGNOSIS — I2583 Coronary atherosclerosis due to lipid rich plaque: Secondary | ICD-10-CM

## 2015-04-18 DIAGNOSIS — I2510 Atherosclerotic heart disease of native coronary artery without angina pectoris: Secondary | ICD-10-CM

## 2015-04-18 NOTE — Patient Instructions (Signed)

## 2015-04-18 NOTE — Progress Notes (Signed)
[facility]. [address] [place], [place]  [address] Phone: [phone] Fax:  [phone]  Date:  04/18/2015   ID:  Tim Hardy, DOB 1940/12/15, MRN [id number]  PCP:  Irven Shelling, MD   History of Present Illness: Tim Hardy is a 75 y.o. male (I also take care of his mother) with aortic valve replacement in September of 2013 here for followup.  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 Very appreciative. No CP, . Having a little fatigue. Understands that he is overweight but he is lost 15 pounds he states. He does have some mild shortness of breath with activity.    04/17/14 echocardiogram reassuring, reassuring bioprosthetic aortic valve.  Cramps with Simvastatin/ higher dose Atorvastatin. Did not start Pravastatin.   Atorvastatin able to tolerate 10mg  QD only. Doing well.   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      Wt Readings from Last 3 Encounters:  04/18/15 234 lb (106.142 kg)  10/20/14 223 lb 12.8 oz (101.515 kg)  07/19/14 231 lb 1.9 oz (104.835 kg)     Past Medical History  Diagnosis Date  . BPH (benign prostatic hyperplasia)   . GERD (gastroesophageal reflux disease)   . Perennial allergic rhinitis   . Aortic valve stenosis, moderate     LVH  . Diastolic dysfunction   . Asthma   . Colon polyps   . Colon, diverticulosis   . Hiatal hernia   . ED (erectile dysfunction)   . Heart murmur   . Bronchitis   . Shortness of breath   . Cancer [facility])     skin cancer removed in August  . Basal cell adenocarcinoma     Past Surgical History  Procedure Laterality Date  . Knee arthroscopy Bilateral   . Shoulder arthroscopy Right   . Hernia repair Bilateral   . Tonsillectomy    . Appendectomy    . Cardiac catheterization    . Colonoscopy w/ polypectomy    . Aortic valve replacement  11/03/2011    Procedure: AORTIC VALVE REPLACEMENT (AVR);  Surgeon: Melrose Nakayama, MD;  Location: [facility];  Service: Open Heart Surgery;  Laterality: N/A;  Partial  Sternotomy  . Sternotomy  11/03/2011    Procedure: STERNOTOMY;  Surgeon: Melrose Nakayama, MD;  Location: [facility];  Service: Open Heart Surgery;  Laterality: N/A;  Partial  . Femoral hernia repair      , x5  . Colonoscopy with propofol N/A 07/11/2014    Procedure: COLONOSCOPY WITH PROPOFOL;  Surgeon: Garlan Fair, MD;  [facility];  Service: Endoscopy;  Laterality: N/A;  . Esophagogastroduodenoscopy (egd) with propofol N/A 07/11/2014    Procedure: ESOPHAGOGASTRODUODENOSCOPY (EGD) WITH PROPOFOL;  Surgeon: Garlan Fair, MD;  [facility];  Service: Endoscopy;  Laterality: N/A;    Current Outpatient Prescriptions  Medication Sig Dispense Refill  . aspirin EC 81 MG tablet Take 81 mg by mouth every morning.     Marland Kitchen atorvastatin (LIPITOR) 10 MG tablet Take 10 mg by mouth daily.    . cetirizine (ZYRTEC) 10 MG tablet Take 10 mg by mouth every morning.     . Cholecalciferol (VITAMIN D PO) Take 1 tablet by mouth every morning.    . Cyanocobalamin (B-12 PO) Take 1 tablet by mouth every morning.     . fluticasone (FLONASE) 50 MCG/ACT nasal spray Place 2 sprays into the nose daily as needed for allergies.     Marland Kitchen HYDROcodone-homatropine (HYCODAN)  5-1.5 MG/5ML syrup Take 5 mLs by mouth every 6 (six) hours as needed for cough.    . Multiple Vitamin (MULTIVITAMIN WITH MINERALS) TABS Take 1 tablet by mouth every morning.     . naproxen sodium (ANAPROX) 220 MG tablet Take 220 mg by mouth 2 (two) times daily as needed (pain).    Marland Kitchen NEXIUM 40 MG capsule Take 40 mg by mouth daily before breakfast.     . Omega-3 Fatty Acids (FISH OIL PO) Take 1 capsule by mouth every morning.    . sildenafil (VIAGRA) 50 MG tablet Take 50 mg by mouth daily as needed for erectile dysfunction.    . Tamsulosin HCl (FLOMAX) 0.4 MG CAPS Take 0.4 mg by mouth daily after supper.      No current facility-administered medications for this visit.    Allergies:    Allergies  Allergen Reactions  . Penicillins Rash     Social History:  The patient  reports that he quit smoking about 43 years ago. His smoking use included Cigarettes. He started smoking about 44 years ago. His smokeless tobacco use includes Chew. He reports that he drinks alcohol. He reports that he does not use illicit drugs.   ROS:  Please see the history of present illness.   Denies any syncope, bleeding, orthopnea, stroke    PHYSICAL EXAM: VS:  BP 150/86 mmHg  Pulse 80  Ht 6\' 1"  (1.854 m)  Wt 234 lb (106.142 kg)  BMI 30.88 kg/m2 Well nourished, well developed, in no acute distress HEENT: normal Neck: no JVD Cardiac:  normal S1, S2; RRR; 2/6 systolic murmur Lungs:  clear to auscultation bilaterally, no wheezing, rhonchi or rales Abd: soft, nontender, no hepatomegalyOverweight Ext: no edema Skin: warm and dry Neuro: no focal abnormalities noted  EKG:  EKG was ordered today. 04/18/15-baseline artifact, sinus rhythm heart rate 80. Nonspecific T-wave changes personally viewed 12/30/13-sinus rhythm, normal EKG, nonspecific ST-T wave changes, heart rate 79-previous Sinus rhythm, 87, nonspecific ST changes     LABS: LDL 54, much improved  Echo: 04/17/14 Normal ejection fraction, normal bioprosthetic aortic valve.  ASSESSMENT AND PLAN:  1. Aortic valve replacement-. ECHO reassuring. No changes made. He is very kind and considerate. Very thankful. 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 He has been taking dental antibiotics prophylactically. 2. Obesity-encourage weight loss. Low carbohydrate diet. Good job. He is down a few pounds 3. Diastolic dysfunction-overall doing well. Weight loss. Blood pressure control. 4. CAD - mild 30-40% LAD. Non flow limiting dz.  Monitor for symptoms. ASA. 81 mg 5. 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 6. Elevated blood pressure-normally does not have hypertension. Continue to monitor. I've asked him not to take Mucinex D 7. Hyperlipidemia -he has been taking 10 mg. Atorvastatin. Doing well.  Continue. 8. 6 month f/u.  Signed, Candee Furbish, MD [facility]  04/18/2015 1:59 PM

## 2015-08-27 ENCOUNTER — Telehealth: Payer: Self-pay | Admitting: Cardiology

## 2015-08-27 DIAGNOSIS — I359 Nonrheumatic aortic valve disorder, unspecified: Secondary | ICD-10-CM

## 2015-08-27 DIAGNOSIS — E7800 Pure hypercholesterolemia, unspecified: Secondary | ICD-10-CM

## 2015-08-27 NOTE — Telephone Encounter (Signed)
Calling stating he usually gets lab work prior to seeing Dr. Marlou Porch.  He is scheduled to see him on 9/12.  Will schedule a LP,HFT and BMET.  Do not see order in last OV of what to schedule.  He will come in on 9/11 fasting.

## 2015-08-27 NOTE — Telephone Encounter (Signed)
New Message  Pt called to schedule app for lab work. appt made for 9/11 just needs lab order linked

## 2015-10-02 ENCOUNTER — Encounter: Payer: Self-pay | Admitting: Cardiology

## 2015-10-15 ENCOUNTER — Other Ambulatory Visit: Payer: Medicare Other | Admitting: *Deleted

## 2015-10-15 DIAGNOSIS — E7800 Pure hypercholesterolemia, unspecified: Secondary | ICD-10-CM

## 2015-10-15 DIAGNOSIS — I359 Nonrheumatic aortic valve disorder, unspecified: Secondary | ICD-10-CM

## 2015-10-15 LAB — HEPATIC FUNCTION PANEL
ALK PHOS: 45 U/L (ref 40–115)
ALT: 21 U/L (ref 9–46)
AST: 22 U/L (ref 10–35)
Albumin: 3.9 g/dL (ref 3.6–5.1)
BILIRUBIN DIRECT: 0.3 mg/dL — AB (ref <=0.2)
BILIRUBIN INDIRECT: 1.4 mg/dL — AB (ref 0.2–1.2)
BILIRUBIN TOTAL: 1.7 mg/dL — AB (ref 0.2–1.2)
Total Protein: 6 g/dL — ABNORMAL LOW (ref 6.1–8.1)

## 2015-10-15 LAB — BASIC METABOLIC PANEL
BUN: 15 mg/dL (ref 7–25)
CALCIUM: 9.2 mg/dL (ref 8.6–10.3)
CO2: 27 mmol/L (ref 20–31)
CREATININE: 1.14 mg/dL (ref 0.70–1.18)
Chloride: 107 mmol/L (ref 98–110)
Glucose, Bld: 87 mg/dL (ref 65–99)
Potassium: 4.1 mmol/L (ref 3.5–5.3)
SODIUM: 142 mmol/L (ref 135–146)

## 2015-10-15 LAB — LIPID PANEL
CHOL/HDL RATIO: 3.9 ratio (ref <=5.0)
CHOLESTEROL: 113 mg/dL — AB (ref 125–200)
HDL: 29 mg/dL — ABNORMAL LOW (ref >=40)
LDL Cholesterol: 56 mg/dL (ref <130)
Triglycerides: 141 mg/dL (ref <150)
VLDL: 28 mg/dL (ref <30)

## 2015-10-16 ENCOUNTER — Encounter: Payer: Self-pay | Admitting: Cardiology

## 2015-10-16 ENCOUNTER — Ambulatory Visit (INDEPENDENT_AMBULATORY_CARE_PROVIDER_SITE_OTHER): Payer: Medicare Other | Admitting: Cardiology

## 2015-10-16 ENCOUNTER — Encounter (INDEPENDENT_AMBULATORY_CARE_PROVIDER_SITE_OTHER): Payer: Self-pay

## 2015-10-16 ENCOUNTER — Other Ambulatory Visit: Payer: Medicare Other

## 2015-10-16 VITALS — BP 116/82 | HR 66 | Ht 73.0 in | Wt 238.0 lb

## 2015-10-16 DIAGNOSIS — Z954 Presence of other heart-valve replacement: Secondary | ICD-10-CM

## 2015-10-16 DIAGNOSIS — Z952 Presence of prosthetic heart valve: Secondary | ICD-10-CM

## 2015-10-16 DIAGNOSIS — I2510 Atherosclerotic heart disease of native coronary artery without angina pectoris: Secondary | ICD-10-CM

## 2015-10-16 DIAGNOSIS — E7800 Pure hypercholesterolemia, unspecified: Secondary | ICD-10-CM | POA: Diagnosis not present

## 2015-10-16 NOTE — Progress Notes (Signed)
[place]. [address], [address] [place], [place]  [address] Phone: [phone] Fax:  [phone]  Date:  10/16/2015   ID:  Tim Hardy, DOB 01-21-1941, MRN [id number]  PCP:  Irven Shelling, MD   History of Present Illness: Tim Hardy is a 75 y.o. male (I also take care of his mother) with aortic valve replacement in September of 2013 here for followup.  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 Very appreciative. No CP . Having a little fatigue. Understands that he is overweight. No SOB. No CP. Friends with Silva Bandy  04/17/14 echocardiogram reassuring, reassuring bioprosthetic aortic valve.  Cramps with Simvastatin/ higher dose Atorvastatin. Did not start Pravastatin.   Atorvastatin able to tolerate 10mg  QD only. Doing well. LDL 56.  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      Wt Readings from Last 3 Encounters:  10/16/15 238 lb (108 kg)  04/18/15 234 lb (106.1 kg)  10/20/14 223 lb 12.8 oz (101.5 kg)     Past Medical History:  Diagnosis Date  . Aortic valve stenosis, moderate    LVH  . Asthma   . Basal cell adenocarcinoma   . BPH (benign prostatic hyperplasia)   . Bronchitis   . Cancer [facility])    skin cancer removed in August  . Colon polyps   . Colon, diverticulosis   . Diastolic dysfunction   . ED (erectile dysfunction)   . GERD (gastroesophageal reflux disease)   . Heart murmur   . Hiatal hernia   . Perennial allergic rhinitis   . Shortness of breath     Past Surgical History:  Procedure Laterality Date  . AORTIC VALVE REPLACEMENT  11/03/2011   Procedure: AORTIC VALVE REPLACEMENT (AVR);  Surgeon: Melrose Nakayama, MD;  Location: [place];  Service: Open Heart Surgery;  Laterality: N/A;  Partial Sternotomy  . APPENDECTOMY    . CARDIAC CATHETERIZATION    . COLONOSCOPY W/ POLYPECTOMY    . COLONOSCOPY WITH PROPOFOL N/A 07/11/2014   Procedure: COLONOSCOPY WITH PROPOFOL;  Surgeon: Garlan Fair, MD;  [facility];  Service: Endoscopy;   Laterality: N/A;  . ESOPHAGOGASTRODUODENOSCOPY (EGD) WITH PROPOFOL N/A 07/11/2014   Procedure: ESOPHAGOGASTRODUODENOSCOPY (EGD) WITH PROPOFOL;  Surgeon: Garlan Fair, MD;  [facility];  Service: Endoscopy;  Laterality: N/A;  . FEMORAL HERNIA REPAIR     , x5  . HERNIA REPAIR Bilateral   . KNEE ARTHROSCOPY Bilateral   . SHOULDER ARTHROSCOPY Right   . STERNOTOMY  11/03/2011   Procedure: STERNOTOMY;  Surgeon: Melrose Nakayama, MD;  Location: [place];  Service: Open Heart Surgery;  Laterality: N/A;  Partial  . TONSILLECTOMY      Current Outpatient Prescriptions  Medication Sig Dispense Refill  . aspirin EC 81 MG tablet Take 81 mg by mouth every morning.     Marland Kitchen atorvastatin (LIPITOR) 10 MG tablet Take 10 mg by mouth daily.    . cetirizine (ZYRTEC) 10 MG tablet Take 10 mg by mouth every morning.     . Cholecalciferol (VITAMIN D PO) Take 1 tablet by mouth every morning.    . Cyanocobalamin (B-12 PO) Take 1 tablet by mouth every morning.     Marland Kitchen esomeprazole (NEXIUM) 20 MG packet Take 10 mg by mouth daily before breakfast.     . fluticasone (FLONASE) 50 MCG/ACT nasal spray Place 2 sprays into the nose daily as needed for allergies.     Marland Kitchen HYDROcodone-homatropine (HYCODAN) 5-1.5 MG/5ML syrup  Take 5 mLs by mouth every 6 (six) hours as needed for cough.    . Multiple Vitamin (MULTIVITAMIN WITH MINERALS) TABS Take 1 tablet by mouth every morning.     . naproxen sodium (ANAPROX) 220 MG tablet Take 220 mg by mouth 2 (two) times daily as needed (pain).    . Omega-3 Fatty Acids (FISH OIL PO) Take 1 capsule by mouth every morning.    . sildenafil (VIAGRA) 50 MG tablet Take 50 mg by mouth daily as needed for erectile dysfunction.    . Tamsulosin HCl (FLOMAX) 0.4 MG CAPS Take 0.4 mg by mouth daily after supper.      No current facility-administered medications for this visit.     Allergies:    Allergies  Allergen Reactions  . Penicillins Rash    Social History:  The patient  reports that  he quit smoking about 43 years ago. His smoking use included Cigarettes. He started smoking about 44 years ago. His smokeless tobacco use includes Chew. He reports that he drinks alcohol. He reports that he does not use drugs.   ROS:  Please see the history of present illness.   Denies any syncope, bleeding, orthopnea, stroke    PHYSICAL EXAM: VS:  BP 116/82   Pulse 66   Ht 6\' 1"  (1.854 m)   Wt 238 lb (108 kg)   BMI 31.40 kg/m  Well nourished, well developed, in no acute distress  HEENT: normal  Neck: no JVD  Cardiac:  normal S1, S2; RRR; 2/6 systolic murmur  Lungs:  clear to auscultation bilaterally, no wheezing, rhonchi or rales  Abd: soft, nontender, no hepatomegaly Overweight Ext: no edema  Skin: warm and dry  Neuro: no focal abnormalities noted  EKG:  EKG was ordered today. 04/18/15-baseline artifact, sinus rhythm heart rate 80. Nonspecific T-wave changes personally viewed 12/30/13-sinus rhythm, normal EKG, nonspecific ST-T wave changes, heart rate 79-previous Sinus rhythm, 87, nonspecific ST changes     LABS: LDL 54, much improved  Echo: 04/17/14 Normal ejection fraction, normal bioprosthetic aortic valve.  ASSESSMENT AND PLAN:  1. Aortic valve replacement-. ECHO reassuring 2016. No changes made. He is very kind and considerate. Very thankful. 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 He has been taking dental antibiotics prophylactically. 2. Obesity-encourage weight loss. Low carbohydrate diet. Good job. He is down a few pounds 3. Diastolic dysfunction-overall doing well. Weight loss. Blood pressure control. 4. CAD - mild 30-40% LAD. Non flow limiting dz.  Monitor for symptoms. ASA. 81 mg 5. 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 6. Hyperlipidemia -he has been taking 10 mg. Atorvastatin. Doing well. Continue. His LFTs, ALT and AST were normal. His total bilirubin was 1.7, indirect bilirubin 1.4 and direct bilirubin 0.3. Total protein was slightly low at 6.0. May be a  normal variant for him. I will forward this note to Dr. Laurann Montana. He is not having any right upper quadrant pain. 7. 6 month f/u.  Signed, Candee Furbish, MD [facility]  10/16/2015 5:11 PM

## 2015-10-16 NOTE — Patient Instructions (Signed)

## 2016-02-28 ENCOUNTER — Telehealth: Payer: Self-pay | Admitting: Cardiology

## 2016-02-28 NOTE — Telephone Encounter (Signed)
New Message     Please call does he need blood work before his appt on 04/18/16?

## 2016-02-28 NOTE — Telephone Encounter (Signed)
Patient is asking if he needs labs drawn prior to his appointment with Dr. Marlou Porch on 04/18/16. It looks like he has been coming in a day or two before his appointments in the past so that his results can be discussed during the appointment. Please advise if and which labs you would like ordered. Thanks

## 2016-02-29 NOTE — Telephone Encounter (Signed)
Left message for patient to call back if he has any questions about not needing lab work .

## 2016-02-29 NOTE — Telephone Encounter (Signed)
No need for labs. Candee Furbish, MD

## 2016-04-11 ENCOUNTER — Encounter: Payer: Self-pay | Admitting: *Deleted

## 2016-04-18 ENCOUNTER — Encounter (INDEPENDENT_AMBULATORY_CARE_PROVIDER_SITE_OTHER): Payer: Self-pay

## 2016-04-18 ENCOUNTER — Ambulatory Visit (INDEPENDENT_AMBULATORY_CARE_PROVIDER_SITE_OTHER): Payer: Medicare Other | Admitting: Cardiology

## 2016-04-18 ENCOUNTER — Encounter: Payer: Self-pay | Admitting: Cardiology

## 2016-04-18 VITALS — BP 132/84 | HR 70 | Ht 73.0 in | Wt 237.0 lb

## 2016-04-18 DIAGNOSIS — E7800 Pure hypercholesterolemia, unspecified: Secondary | ICD-10-CM

## 2016-04-18 DIAGNOSIS — C61 Malignant neoplasm of prostate: Secondary | ICD-10-CM

## 2016-04-18 DIAGNOSIS — E6609 Other obesity due to excess calories: Secondary | ICD-10-CM

## 2016-04-18 DIAGNOSIS — R0602 Shortness of breath: Secondary | ICD-10-CM | POA: Diagnosis not present

## 2016-04-18 DIAGNOSIS — Z952 Presence of prosthetic heart valve: Secondary | ICD-10-CM

## 2016-04-18 NOTE — Progress Notes (Signed)
Cardiology Office Note    Date:  04/18/2016   ID:  Adlai Sinning, DOB [birth year], MRN [id number]  PCP:  Irven Shelling, MD  Cardiologist:   Candee Furbish, MD     History of Present Illness:  Tim Hardy is a 76 y.o. male with aortic valve replacement in September 2013 here for follow-up. Only had nonobstructive coronary artery disease on cardiac catheterization, 30-40% LAD. I took care of his mother as well.  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    Past Medical History:  Diagnosis Date  . Aortic valve stenosis, moderate    LVH  . Asthma   . Basal cell adenocarcinoma   . BPH (benign prostatic hyperplasia)   . Bronchitis   . Cancer [facility])    skin cancer removed in August  . Colon polyps   . Colon, diverticulosis   . Diastolic dysfunction   . ED (erectile dysfunction)   . GERD (gastroesophageal reflux disease)   . Heart murmur   . Hiatal hernia   . Perennial allergic rhinitis   . Shortness of breath     Past Surgical History:  Procedure Laterality Date  . AORTIC VALVE REPLACEMENT  11/03/2011   Procedure: AORTIC VALVE REPLACEMENT (AVR);  Surgeon: Melrose Nakayama, MD;  Location: [place];  Service: Open Heart Surgery;  Laterality: N/A;  Partial Sternotomy  . APPENDECTOMY    . CARDIAC CATHETERIZATION    . COLONOSCOPY W/ POLYPECTOMY    . COLONOSCOPY WITH PROPOFOL N/A 07/11/2014   Procedure: COLONOSCOPY WITH PROPOFOL;  Surgeon: Garlan Fair, MD;  [facility];  Service: Endoscopy;  Laterality: N/A;  . ESOPHAGOGASTRODUODENOSCOPY (EGD) WITH PROPOFOL N/A 07/11/2014   Procedure: ESOPHAGOGASTRODUODENOSCOPY (EGD) WITH PROPOFOL;  Surgeon: Garlan Fair, MD;  [facility];  Service: Endoscopy;  Laterality: N/A;  . FEMORAL HERNIA REPAIR     , x5  . HERNIA REPAIR Bilateral   . KNEE ARTHROSCOPY Bilateral   . SHOULDER ARTHROSCOPY Right   . STERNOTOMY  11/03/2011   Procedure: STERNOTOMY;  Surgeon: Melrose Nakayama,  MD;  Location: [place];  Service: Open Heart Surgery;  Laterality: N/A;  Partial  . TONSILLECTOMY      Current Medications: Outpatient Medications Prior to Visit  Medication Sig Dispense Refill  . aspirin EC 81 MG tablet Take 81 mg by mouth every morning.     Marland Kitchen atorvastatin (LIPITOR) 10 MG tablet Take 10 mg by mouth daily.    . cetirizine (ZYRTEC) 10 MG tablet Take 10 mg by mouth every morning.     . Cholecalciferol (VITAMIN D PO) Take 1 tablet by mouth every morning.    . Cyanocobalamin (B-12 PO) Take 1 tablet by mouth every morning.     Marland Kitchen esomeprazole (NEXIUM) 20 MG packet Take 10 mg by mouth daily before breakfast.     . fluticasone (FLONASE) 50 MCG/ACT nasal spray Place 2 sprays into the nose daily as needed for allergies.     Marland Kitchen HYDROcodone-homatropine (HYCODAN) 5-1.5 MG/5ML syrup Take 5 mLs by mouth every 6 (six) hours as needed for cough.    . Multiple Vitamin (MULTIVITAMIN WITH MINERALS) TABS Take 1 tablet by mouth every morning.     . naproxen sodium (ANAPROX) 220 MG tablet Take 220 mg by mouth 2 (two) times daily as needed (pain).    . Omega-3 Fatty Acids (FISH OIL PO) Take 1 capsule by mouth every morning.    . sildenafil (VIAGRA) 50 MG tablet Take  50 mg by mouth daily as needed for erectile dysfunction.    . Tamsulosin HCl (FLOMAX) 0.4 MG CAPS Take 0.4 mg by mouth daily after supper.      No facility-administered medications prior to visit.      Allergies:   Penicillins   Social History   Social History  . Marital status: Married    Spouse name: N/A  . Number of children: N/A  . Years of education: N/A   Occupational History  . retired    Social History Main Topics  . Smoking status: Former Smoker    Types: Cigarettes    Start date: 02/04/1971    Quit date: 02/03/1972  . Smokeless tobacco: Current User    Types: Chew  . Alcohol use Yes     Comment: rare  . Drug use: No  . Sexual activity: Not Asked   Other Topics Concern  . None   Social History Narrative    Retired Chief Financial Officer at [employer]    He is a Physicist, medical and enjoys renovation log cabins       Family History:  The patient's family history includes Cancer in his father; Diabetes in his sister and son; Heart attack in his mother; Hypertension in his father.   ROS:   Please see the history of present illness.    ROS All other systems reviewed and are negative.   PHYSICAL EXAM:   VS:  BP 132/84   Pulse 70   Ht 6\' 1"  (1.854 m)   Wt 237 lb (107.5 kg)   SpO2 98%   BMI 31.27 kg/m    GEN: Well nourished, well developed, in no acute distress  HEENT: normal  Neck: no JVD, carotid bruits, or masses Cardiac: RRR; no murmurs, rubs, or gallops,no edema  Respiratory:  clear to auscultation bilaterally, normal work of breathing GI: soft, nontender, nondistended, + BS MS: no deformity or atrophy  Skin: warm and dry, no rash Neuro:  Alert and Oriented x 3, Strength and sensation are intact Psych: euthymic mood, full affect  Wt Readings from Last 3 Encounters:  04/18/16 237 lb (107.5 kg)  10/16/15 238 lb (108 kg)  04/18/15 234 lb (106.1 kg)      Studies/Labs Reviewed:   EKG:  None today.   Recent Labs: 10/15/2015: ALT 21; BUN 15; Creat 1.14; Potassium 4.1; Sodium 142   Lipid Panel    Component Value Date/Time   CHOL 113 (L) 10/15/2015 0811   TRIG 141 10/15/2015 0811   HDL 29 (L) 10/15/2015 0811   CHOLHDL 3.9 10/15/2015 0811   VLDL 28 10/15/2015 0811   LDLCALC 56 10/15/2015 0811    Additional studies/ records that were reviewed today include:   ECHO 04/17/14  - Left ventricle: The cavity size was normal. Wall thickness was increased in a pattern of mild LVH. There was mild focal basal hypertrophy of the septum. Systolic function was normal. The estimated ejection fraction was in the range of 60% to 65%. Wall motion was normal; there were no regional wall motion abnormalities. Doppler parameters are consistent with abnormal left ventricular relaxation (grade  1 diastolic dysfunction). - Aortic valve: A bioprosthesis was present. There was trivial regurgitation. Mean gradient (S): 17 mm Hg. Peak gradient (S): 29 mm Hg. - Ascending aorta: The ascending aorta was mildly dilated. - Mitral valve: Calcified annulus. There was mild regurgitation. - Left atrium: The atrium was mildly dilated. - Right atrium: The atrium was mildly dilated.   ASSESSMENT:  1. H/O aortic valve replacement   2. Pure hypercholesterolemia   3. Prostate CA ([place])   4. Shortness of breath   5. Class 1 obesity due to excess calories without serious comorbidity in adult, unspecified BMI      PLAN:  In order of problems listed above:  History of aortic valve replacement  - Echocardiogram reassuring in 2016. Very thankful. Dental prophylaxis.  - No need to repeat echo gram at this time. Monitor his mild shortness of breath. I think that if he can lose 30-40 pounds the BP of benefit.  Hyperlipidemia  - Low-dose atorvastatin 10 mg. Mildly elevated bilirubin in the past.  Prostate cancer  - Previous Gleason score is 6. Dr. Rosana Hoes has been monitoring.  Obesity  - Continue to encourage weight loss.  - Gym.      Medication Adjustments/Labs and Tests Ordered: Current medicines are reviewed at length with the patient today.  Concerns regarding medicines are outlined above.  Medication changes, Labs and Tests ordered today are listed in the Patient Instructions below. Patient Instructions  Medication Instructions: Your physician recommends that you continue on your current medications as directed. Please refer to the Current Medication list given to you today.  Labwork: None Ordered  Procedures/Testing: None Ordered  Follow-Up: Your physician wants you to follow-up in: 6 MONTHS with Dr. Marlou Porch. You will receive a reminder letter in the mail two months in advance. If you don't receive a letter, please call our office to schedule the follow-up appointment.  If  you need a refill on your cardiac medications before your next appointment, please call your pharmacy.    Thank you for choosing [facility]!!        Signed, Candee Furbish, MD  04/18/2016 3:08 PM    [facility] [place], [place], [place]  [address] Phone: [phone]; Fax: [phone]

## 2016-04-18 NOTE — Patient Instructions (Addendum)
Medication Instructions: Your physician recommends that you continue on your current medications as directed. Please refer to the Current Medication list given to you today.  Labwork: None Ordered  Procedures/Testing: None Ordered  Follow-Up: Your physician wants you to follow-up in: 6 MONTHS with Dr. Marlou Porch. You will receive a reminder letter in the mail two months in advance. If you don't receive a letter, please call our office to schedule the follow-up appointment.  If you need a refill on your cardiac medications before your next appointment, please call your pharmacy.    Thank you for choosing [facility]!!

## 2016-09-15 ENCOUNTER — Encounter: Payer: Self-pay | Admitting: Cardiology

## 2016-10-02 ENCOUNTER — Ambulatory Visit (INDEPENDENT_AMBULATORY_CARE_PROVIDER_SITE_OTHER): Payer: Medicare Other | Admitting: Cardiology

## 2016-10-02 ENCOUNTER — Encounter: Payer: Self-pay | Admitting: Cardiology

## 2016-10-02 VITALS — BP 128/80 | HR 65 | Ht 73.0 in | Wt 225.0 lb

## 2016-10-02 DIAGNOSIS — Z952 Presence of prosthetic heart valve: Secondary | ICD-10-CM

## 2016-10-02 DIAGNOSIS — C61 Malignant neoplasm of prostate: Secondary | ICD-10-CM

## 2016-10-02 DIAGNOSIS — E7800 Pure hypercholesterolemia, unspecified: Secondary | ICD-10-CM | POA: Diagnosis not present

## 2016-10-02 NOTE — Progress Notes (Signed)
Cardiology Office Note    Date:  10/02/2016   ID:  Tim Hardy, DOB [birth year], MRN [id number]  PCP:  Lavone Orn, MD  Cardiologist:   Candee Furbish, MD     History of Present Illness:  Tim Hardy is a 76 y.o. male with aortic valve replacement in September 2013 here for follow-up. Only had nonobstructive coronary artery disease on cardiac catheterization, 30-40% LAD. I took care of his mother as well.  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  Denies any chest pain, shortness of breath, syncope, bleeding. No issues related to his aortic valve. He takes his dental antibiotics.    Past Medical History:  Diagnosis Date  . Aortic valve stenosis, moderate    LVH  . Asthma   . Basal cell adenocarcinoma   . BPH (benign prostatic hyperplasia)   . Bronchitis   . Cancer [facility])    skin cancer removed in August  . Colon polyps   . Colon, diverticulosis   . Diastolic dysfunction   . ED (erectile dysfunction)   . GERD (gastroesophageal reflux disease)   . Heart murmur   . Hiatal hernia   . Perennial allergic rhinitis   . Shortness of breath     Past Surgical History:  Procedure Laterality Date  . AORTIC VALVE REPLACEMENT  11/03/2011   Procedure: AORTIC VALVE REPLACEMENT (AVR);  Surgeon: Melrose Nakayama, MD;  Location: [place];  Service: Open Heart Surgery;  Laterality: N/A;  Partial Sternotomy  . APPENDECTOMY    . CARDIAC CATHETERIZATION    . COLONOSCOPY W/ POLYPECTOMY    . COLONOSCOPY WITH PROPOFOL N/A 07/11/2014   Procedure: COLONOSCOPY WITH PROPOFOL;  Surgeon: Garlan Fair, MD;  [facility];  Service: Endoscopy;  Laterality: N/A;  . ESOPHAGOGASTRODUODENOSCOPY (EGD) WITH PROPOFOL N/A 07/11/2014   Procedure: ESOPHAGOGASTRODUODENOSCOPY (EGD) WITH PROPOFOL;  Surgeon: Garlan Fair, MD;  [facility];  Service: Endoscopy;  Laterality: N/A;  . FEMORAL HERNIA REPAIR     , x5  . HERNIA REPAIR Bilateral   . KNEE  ARTHROSCOPY Bilateral   . SHOULDER ARTHROSCOPY Right   . STERNOTOMY  11/03/2011   Procedure: STERNOTOMY;  Surgeon: Melrose Nakayama, MD;  Location: [place];  Service: Open Heart Surgery;  Laterality: N/A;  Partial  . TONSILLECTOMY      Current Medications: Outpatient Medications Prior to Visit  Medication Sig Dispense Refill  . aspirin EC 81 MG tablet Take 81 mg by mouth every morning.     Marland Kitchen atorvastatin (LIPITOR) 10 MG tablet Take 10 mg by mouth daily.    . cetirizine (ZYRTEC) 10 MG tablet Take 10 mg by mouth every morning.     . Cholecalciferol (VITAMIN D PO) Take 1 tablet by mouth every morning.    . Cyanocobalamin (B-12 PO) Take 1 tablet by mouth every morning.     Marland Kitchen esomeprazole (NEXIUM) 20 MG packet Take 10 mg by mouth daily before breakfast.     . fluticasone (FLONASE) 50 MCG/ACT nasal spray Place 2 sprays into the nose daily as needed for allergies.     . fluticasone (FLOVENT DISKUS) 50 MCG/BLIST diskus inhaler Inhale 2 puffs into the lungs 2 (two) times daily.    Marland Kitchen HYDROcodone-homatropine (HYCODAN) 5-1.5 MG/5ML syrup Take 5 mLs by mouth every 6 (six) hours as needed for cough.    . Multiple Vitamin (MULTIVITAMIN WITH MINERALS) TABS Take 1 tablet by mouth every morning.     . naproxen sodium (  ANAPROX) 220 MG tablet Take 220 mg by mouth 2 (two) times daily as needed (pain).    . Omega-3 Fatty Acids (FISH OIL PO) Take 1 capsule by mouth every morning.    . sildenafil (VIAGRA) 50 MG tablet Take 50 mg by mouth daily as needed for erectile dysfunction.    . Tamsulosin HCl (FLOMAX) 0.4 MG CAPS Take 0.4 mg by mouth daily after supper.      No facility-administered medications prior to visit.      Allergies:   Penicillins   Social History   Social History  . Marital status: Married    Spouse name: N/A  . Number of children: N/A  . Years of education: N/A   Occupational History  . retired    Social History Main Topics  . Smoking status: Former Smoker    Types: Cigarettes     Start date: 02/04/1971    Quit date: 02/03/1972  . Smokeless tobacco: Current User    Types: Chew  . Alcohol use Yes     Comment: rare  . Drug use: No  . Sexual activity: Not Asked   Other Topics Concern  . None   Social History Narrative   Retired Chief Financial Officer at [employer]    He is a Physicist, medical and enjoys renovation log cabins       Family History:  The patient's family history includes Cancer in his father; Diabetes in his sister and son; Heart attack in his mother; Hypertension in his father.   ROS:   Please see the history of present illness.    ROS All other systems reviewed and are negative.   PHYSICAL EXAM:   VS:  BP 128/80   Pulse 65   Ht 6\' 1"  (1.854 m)   Wt 225 lb (102.1 kg)   BMI 29.69 kg/m    GEN: Well nourished, well developed, in no acute distress  HEENT: normal  Neck: no JVD, carotid bruits, or masses Cardiac: RRR; no murmurs, rubs, or gallops,no edema  Respiratory:  clear to auscultation bilaterally, normal work of breathing GI: soft, nontender, nondistended, + BS MS: no deformity or atrophy  Skin: warm and dry, no rash Neuro:  Alert and Oriented x 3, Strength and sensation are intact Psych: euthymic mood, full affect   Wt Readings from Last 3 Encounters:  10/02/16 225 lb (102.1 kg)  04/18/16 237 lb (107.5 kg)  10/16/15 238 lb (108 kg)      Studies/Labs Reviewed:   EKG:  10/02/16-sinus rhythm [id number] with no other abnormalities other than nonspecific T-wave change personally viewed.  Recent Labs: 10/15/2015: ALT 21; BUN 15; Creat 1.14; Potassium 4.1; Sodium 142   Lipid Panel    Component Value Date/Time   CHOL 113 (L) 10/15/2015 0811   TRIG 141 10/15/2015 0811   HDL 29 (L) 10/15/2015 0811   CHOLHDL 3.9 10/15/2015 0811   VLDL 28 10/15/2015 0811   LDLCALC 56 10/15/2015 0811    Additional studies/ records that were reviewed today include:   ECHO 04/17/14  - Left ventricle: The cavity size was normal. Wall thickness was increased in a  pattern of mild LVH. There was mild focal basal hypertrophy of the septum. Systolic function was normal. The estimated ejection fraction was in the range of 60% to 65%. Wall motion was normal; there were no regional wall motion abnormalities. Doppler parameters are consistent with abnormal left ventricular relaxation (grade 1 diastolic dysfunction). - Aortic valve: A bioprosthesis was present. There was trivial regurgitation.  Mean gradient (S): 17 mm Hg. Peak gradient (S): 29 mm Hg. - Ascending aorta: The ascending aorta was mildly dilated. - Mitral valve: Calcified annulus. There was mild regurgitation. - Left atrium: The atrium was mildly dilated. - Right atrium: The atrium was mildly dilated.   ASSESSMENT:    1. H/O aortic valve replacement   2. Prostate CA ([place])   3. Pure hypercholesterolemia      PLAN:  In order of problems listed above:  History of aortic valve replacement  - Echocardiogram reassuring in 2016. Very thankful. Dental prophylaxis.  - Overall doing well. No change in murmur.  Hyperlipidemia  - Low-dose atorvastatin 10 mg. Mildly elevated bilirubin in the past.  Prostate cancer  - Previous Gleason score is 6. Dr. Rosana Hoes has been monitoring.PSA has been unchanged.  Obesity  - Continue to encourage weight loss.  - Gym. Her job with weight loss.    Medication Adjustments/Labs and Tests Ordered: Current medicines are reviewed at length with the patient today.  Concerns regarding medicines are outlined above.  Medication changes, Labs and Tests ordered today are listed in the Patient Instructions below. Patient Instructions  Medication Instructions:  The current medical regimen is effective;  continue present plan and medications.  Follow-Up: Follow up in 6 months with Dr. Marlou Porch.  You will receive a letter in the mail 2 months before you are due.  Please call us when you receive this letter to schedule your follow up appointment.  If you  need a refill on your cardiac medications before your next appointment, please call your pharmacy.  Thank you for choosing [facility]!!        Signed, Candee Furbish, MD  10/02/2016 5:12 PM    [facility] [place], [place], [place]  [address] Phone: [phone]; Fax: [phone]

## 2016-10-02 NOTE — Patient Instructions (Signed)

## 2017-03-31 ENCOUNTER — Encounter: Payer: Self-pay | Admitting: Cardiology

## 2017-03-31 ENCOUNTER — Ambulatory Visit: Payer: Medicare Other | Admitting: Cardiology

## 2017-03-31 VITALS — BP 134/76 | HR 95 | Ht 73.0 in | Wt 236.2 lb

## 2017-03-31 DIAGNOSIS — C61 Malignant neoplasm of prostate: Secondary | ICD-10-CM

## 2017-03-31 DIAGNOSIS — I359 Nonrheumatic aortic valve disorder, unspecified: Secondary | ICD-10-CM | POA: Diagnosis not present

## 2017-03-31 DIAGNOSIS — Z952 Presence of prosthetic heart valve: Secondary | ICD-10-CM

## 2017-03-31 DIAGNOSIS — E7800 Pure hypercholesterolemia, unspecified: Secondary | ICD-10-CM | POA: Diagnosis not present

## 2017-03-31 NOTE — Patient Instructions (Signed)
Medication Instructions:  The current medical regimen is effective;  continue present plan and medications.  Testing/Procedures: Your physician has requested that you have an echocardiogram. Echocardiography is a painless test that uses sound waves to create images of your heart. It provides your doctor with information about the size and shape of your heart and how well your heart's chambers and valves are working. This procedure takes approximately one hour. There are no restrictions for this procedure.  Follow-Up: Follow up in 6 months with Dr. Skains.  You will receive a letter in the mail 2 months before you are due.  Please call us when you receive this letter to schedule your follow up appointment.  Thank you for choosing [facility]!!     

## 2017-03-31 NOTE — Progress Notes (Signed)
Cardiology Office Note    Date:  03/31/2017   ID:  Tim Hardy, DOB [birth year], MRN [id number]  PCP:  Lavone Orn, MD  Cardiologist:   Candee Furbish, MD     History of Present Illness:  Tim Hardy is a 77 y.o. male with aortic valve replacement in September 2013 here for follow-up. Only had nonobstructive coronary artery disease on cardiac catheterization, 30-40% LAD. I took care of his mother as well.  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  No issues related to his aortic valve. He takes his dental antibiotics.  03/31/17-overall he has been doing quite well.  No chest pain fevers chills nausea vomiting syncope bleeding.  I am not had any prolonged fevers.  Aortic valve seems to be doing well.  Taking his medications.    Past Medical History:  Diagnosis Date  . Aortic valve stenosis, moderate    LVH  . Asthma   . Basal cell adenocarcinoma   . BPH (benign prostatic hyperplasia)   . Bronchitis   . Cancer [facility])    skin cancer removed in August  . Colon polyps   . Colon, diverticulosis   . Diastolic dysfunction   . ED (erectile dysfunction)   . GERD (gastroesophageal reflux disease)   . Heart murmur   . Hiatal hernia   . Perennial allergic rhinitis   . Shortness of breath     Past Surgical History:  Procedure Laterality Date  . AORTIC VALVE REPLACEMENT  11/03/2011   Procedure: AORTIC VALVE REPLACEMENT (AVR);  Surgeon: Melrose Nakayama, MD;  Location: [place];  Service: Open Heart Surgery;  Laterality: N/A;  Partial Sternotomy  . APPENDECTOMY    . CARDIAC CATHETERIZATION    . COLONOSCOPY W/ POLYPECTOMY    . COLONOSCOPY WITH PROPOFOL N/A 07/11/2014   Procedure: COLONOSCOPY WITH PROPOFOL;  Surgeon: Garlan Fair, MD;  [facility];  Service: Endoscopy;  Laterality: N/A;  . ESOPHAGOGASTRODUODENOSCOPY (EGD) WITH PROPOFOL N/A 07/11/2014   Procedure: ESOPHAGOGASTRODUODENOSCOPY (EGD) WITH PROPOFOL;  Surgeon: Garlan Fair, MD;   [facility];  Service: Endoscopy;  Laterality: N/A;  . FEMORAL HERNIA REPAIR     , x5  . HERNIA REPAIR Bilateral   . KNEE ARTHROSCOPY Bilateral   . SHOULDER ARTHROSCOPY Right   . STERNOTOMY  11/03/2011   Procedure: STERNOTOMY;  Surgeon: Melrose Nakayama, MD;  Location: [place];  Service: Open Heart Surgery;  Laterality: N/A;  Partial  . TONSILLECTOMY      Current Medications: Outpatient Medications Prior to Visit  Medication Sig Dispense Refill  . aspirin EC 81 MG tablet Take 81 mg by mouth every morning.     Marland Kitchen atorvastatin (LIPITOR) 10 MG tablet Take 10 mg by mouth daily.    . cetirizine (ZYRTEC) 10 MG tablet Take 10 mg by mouth every morning.     . Cholecalciferol (VITAMIN D PO) Take 1 tablet by mouth every morning.    . Cyanocobalamin (B-12 PO) Take 1 tablet by mouth every morning.     . fluticasone (FLONASE) 50 MCG/ACT nasal spray Place 2 sprays into the nose daily as needed for allergies.     . fluticasone (FLOVENT DISKUS) 50 MCG/BLIST diskus inhaler Inhale 2 puffs into the lungs 2 (two) times daily.    Marland Kitchen HYDROcodone-homatropine (HYCODAN) 5-1.5 MG/5ML syrup Take 5 mLs by mouth every 6 (six) hours as needed for cough.    . Multiple Vitamin (MULTIVITAMIN WITH MINERALS) TABS Take 1 tablet  by mouth every morning.     . naproxen sodium (ANAPROX) 220 MG tablet Take 220 mg by mouth 2 (two) times daily as needed (pain).    . Omega-3 Fatty Acids (FISH OIL PO) Take 1 capsule by mouth every morning.    Marland Kitchen omeprazole (PRILOSEC) 10 MG capsule Take 1 capsule by mouth daily.    . sildenafil (VIAGRA) 50 MG tablet Take 50 mg by mouth daily as needed for erectile dysfunction.    . Tamsulosin HCl (FLOMAX) 0.4 MG CAPS Take 0.4 mg by mouth daily after supper.     . esomeprazole (NEXIUM) 20 MG packet Take 10 mg by mouth daily before breakfast.      No facility-administered medications prior to visit.      Allergies:   Penicillins   Social History   Socioeconomic History  . Marital  status: Married    Spouse name: None  . Number of children: None  . Years of education: None  . Highest education level: None  Social Needs  . Financial resource strain: None  . Food insecurity - worry: None  . Food insecurity - inability: None  . Transportation needs - medical: None  . Transportation needs - non-medical: None  Occupational History  . Occupation: retired  Tobacco Use  . Smoking status: Former Smoker    Types: Cigarettes    Start date: 02/04/1971    Last attempt to quit: 02/03/1972    Years since quitting: 45.1  . Smokeless tobacco: Current User    Types: Chew  Substance and Sexual Activity  . Alcohol use: Yes    Comment: rare  . Drug use: No  . Sexual activity: None  Other Topics Concern  . None  Social History Narrative   Retired Chief Financial Officer at [employer]    He is a Physicist, medical and enjoys renovation log cabins       Family History:  The patient's family history includes Cancer in his father; Diabetes in his sister and son; Heart attack in his mother; Hypertension in his father.   ROS:   Please see the history of present illness.    ROS All other systems reviewed and are negative.   PHYSICAL EXAM:   VS:  BP 134/76   Pulse 95   Ht 6\' 1"  (1.854 m)   Wt 236 lb 3.2 oz (107.1 kg)   SpO2 97%   BMI 31.16 kg/m    GEN: Well nourished, well developed, in no acute distress  HEENT: normal  Neck: no JVD, carotid bruits, or masses Cardiac: RRR; 1/6 SM, no rubs, or gallops,no edema  Respiratory:  clear to auscultation bilaterally, normal work of breathing GI: soft, nontender, nondistended, + BS MS: no deformity or atrophy  Skin: warm and dry, no rash Neuro:  Alert and Oriented x 3, Strength and sensation are intact Psych: euthymic mood, full affect    Wt Readings from Last 3 Encounters:  03/31/17 236 lb 3.2 oz (107.1 kg)  10/02/16 225 lb (102.1 kg)  04/18/16 237 lb (107.5 kg)      Studies/Labs Reviewed:   EKG:  10/02/16-sinus rhythm NSR65 with no  other abnormalities other than nonspecific T-wave change personally viewed.  Recent Labs: No results found for requested labs within last 8760 hours.   Lipid Panel    Component Value Date/Time   CHOL 113 (L) 10/15/2015 0811   TRIG 141 10/15/2015 0811   HDL 29 (L) 10/15/2015 0811   CHOLHDL 3.9 10/15/2015 0811   VLDL 28  10/15/2015 0811   LDLCALC 56 10/15/2015 0811    Additional studies/ records that were reviewed today include:   ECHO 04/17/14  - Left ventricle: The cavity size was normal. Wall thickness was increased in a pattern of mild LVH. There was mild focal basal hypertrophy of the septum. Systolic function was normal. The estimated ejection fraction was in the range of 60% to 65%. Wall motion was normal; there were no regional wall motion abnormalities. Doppler parameters are consistent with abnormal left ventricular relaxation (grade 1 diastolic dysfunction). - Aortic valve: A bioprosthesis was present. There was trivial regurgitation. Mean gradient (S): 17 mm Hg. Peak gradient (S): 29 mm Hg. - Ascending aorta: The ascending aorta was mildly dilated. - Mitral valve: Calcified annulus. There was mild regurgitation. - Left atrium: The atrium was mildly dilated. - Right atrium: The atrium was mildly dilated.   ASSESSMENT:    1. Aortic valve disorder   2. H/O aortic valve replacement   3. Prostate CA ([place])   4. Hypercholesteremia      PLAN:  In order of problems listed above:  History of aortic valve replacement  - Echocardiogram reassuring in 2016. Very thankful. Dental prophylaxis.  - Overall doing well. No change in murmur.  - Will recheck ECHO.   Hyperlipidemia  - Low-dose atorvastatin 10 mg. Mildly elevated bilirubin in the past. Doing well. No myalgia.   Prostate cancer  - Previous Gleason score is 6. Dr. Rosana Hoes has been monitoring.PSA has been unchanged. Good news.   Obesity  - Continue to encourage weight loss.  - Gym. Continue to  work on this.     Medication Adjustments/Labs and Tests Ordered: Current medicines are reviewed at length with the patient today.  Concerns regarding medicines are outlined above.  Medication changes, Labs and Tests ordered today are listed in the Patient Instructions below. Patient Instructions  Medication Instructions:  The current medical regimen is effective;  continue present plan and medications.  Testing/Procedures: Your physician has requested that you have an echocardiogram. Echocardiography is a painless test that uses sound waves to create images of your heart. It provides your doctor with information about the size and shape of your heart and how well your heart's chambers and valves are working. This procedure takes approximately one hour. There are no restrictions for this procedure.  Follow-Up: Follow up in 6 months with Dr. Marlou Porch.  You will receive a letter in the mail 2 months before you are due.  Please call us when you receive this letter to schedule your follow up appointment.  Thank you for choosing [facility]!!        Signed, Candee Furbish, MD  03/31/2017 3:41 PM    [facility] [place], [place], [place]  [address] Phone: [phone]; Fax: [phone]

## 2017-04-21 ENCOUNTER — Ambulatory Visit (HOSPITAL_COMMUNITY): Payer: Medicare Other | Attending: Cardiology

## 2017-04-21 ENCOUNTER — Other Ambulatory Visit: Payer: Self-pay

## 2017-04-21 DIAGNOSIS — I5030 Unspecified diastolic (congestive) heart failure: Secondary | ICD-10-CM | POA: Diagnosis not present

## 2017-04-21 DIAGNOSIS — I082 Rheumatic disorders of both aortic and tricuspid valves: Secondary | ICD-10-CM | POA: Insufficient documentation

## 2017-04-21 DIAGNOSIS — I359 Nonrheumatic aortic valve disorder, unspecified: Secondary | ICD-10-CM | POA: Diagnosis not present

## 2017-04-21 DIAGNOSIS — I420 Dilated cardiomyopathy: Secondary | ICD-10-CM | POA: Diagnosis not present

## 2017-04-21 DIAGNOSIS — Z952 Presence of prosthetic heart valve: Secondary | ICD-10-CM | POA: Insufficient documentation

## 2017-09-28 ENCOUNTER — Encounter: Payer: Self-pay | Admitting: *Deleted

## 2017-09-28 ENCOUNTER — Encounter: Payer: Self-pay | Admitting: Cardiology

## 2017-09-28 ENCOUNTER — Ambulatory Visit: Payer: Medicare Other | Admitting: Cardiology

## 2017-09-28 VITALS — BP 120/70 | HR 80 | Ht 73.0 in | Wt 231.6 lb

## 2017-09-28 DIAGNOSIS — R0602 Shortness of breath: Secondary | ICD-10-CM

## 2017-09-28 DIAGNOSIS — Z79899 Other long term (current) drug therapy: Secondary | ICD-10-CM

## 2017-09-28 DIAGNOSIS — Z952 Presence of prosthetic heart valve: Secondary | ICD-10-CM

## 2017-09-28 DIAGNOSIS — E7800 Pure hypercholesterolemia, unspecified: Secondary | ICD-10-CM

## 2017-09-28 DIAGNOSIS — R079 Chest pain, unspecified: Secondary | ICD-10-CM

## 2017-09-28 NOTE — Progress Notes (Signed)
Cardiology Office Note    Date:  09/28/2017   ID:  Tim Hardy, DOB 03/15/1940, MRN [id number]  PCP:  Lavone Orn, MD  Cardiologist:   Candee Furbish, MD     History of Present Illness:  Tim Hardy is a 77 y.o. male with aortic valve replacement in September 2013 here for follow-up. Only had nonobstructive coronary artery disease on cardiac catheterization, 30-40% LAD. I took care of his mother as well.  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  No issues related to his aortic valve. He takes his dental antibiotics.  03/31/17-overall he has been doing quite well.  No chest pain fevers chills nausea vomiting syncope bleeding.  I am not had any prolonged fevers.  Aortic valve seems to be doing well.  Taking his medications.  09/28/2017- been feeling some increased shortness of breath, difficulty breathing over the summer.  Some fleeting chest discomfort as well. Left sided sharp pain, 5 min.   Past Medical History:  Diagnosis Date  . Aortic valve stenosis, moderate    LVH  . Asthma   . Basal cell adenocarcinoma   . BPH (benign prostatic hyperplasia)   . Bronchitis   . Cancer [facility])    skin cancer removed in August  . Colon polyps   . Colon, diverticulosis   . Diastolic dysfunction   . ED (erectile dysfunction)   . GERD (gastroesophageal reflux disease)   . Heart murmur   . Hiatal hernia   . Perennial allergic rhinitis   . Shortness of breath     Past Surgical History:  Procedure Laterality Date  . AORTIC VALVE REPLACEMENT  11/03/2011   Procedure: AORTIC VALVE REPLACEMENT (AVR);  Surgeon: Melrose Nakayama, MD;  Location: [place];  Service: Open Heart Surgery;  Laterality: N/A;  Partial Sternotomy  . APPENDECTOMY    . CARDIAC CATHETERIZATION    . COLONOSCOPY W/ POLYPECTOMY    . COLONOSCOPY WITH PROPOFOL N/A 07/11/2014   Procedure: COLONOSCOPY WITH PROPOFOL;  Surgeon: Garlan Fair, MD;  [facility];  Service: Endoscopy;   Laterality: N/A;  . ESOPHAGOGASTRODUODENOSCOPY (EGD) WITH PROPOFOL N/A 07/11/2014   Procedure: ESOPHAGOGASTRODUODENOSCOPY (EGD) WITH PROPOFOL;  Surgeon: Garlan Fair, MD;  [facility];  Service: Endoscopy;  Laterality: N/A;  . FEMORAL HERNIA REPAIR     , x5  . HERNIA REPAIR Bilateral   . KNEE ARTHROSCOPY Bilateral   . SHOULDER ARTHROSCOPY Right   . STERNOTOMY  11/03/2011   Procedure: STERNOTOMY;  Surgeon: Melrose Nakayama, MD;  Location: [place];  Service: Open Heart Surgery;  Laterality: N/A;  Partial  . TONSILLECTOMY      Current Medications: Outpatient Medications Prior to Visit  Medication Sig Dispense Refill  . aspirin EC 81 MG tablet Take 81 mg by mouth every morning.     Marland Kitchen atorvastatin (LIPITOR) 10 MG tablet Take 10 mg by mouth daily.    . cetirizine (ZYRTEC) 10 MG tablet Take 10 mg by mouth every morning.     . Cholecalciferol (VITAMIN D PO) Take 1 tablet by mouth every morning.    . clindamycin (CLEOCIN) 150 MG capsule Take 4 capsules by mouth as needed. Take 4 capsules before any dental procedures  0  . Cyanocobalamin (B-12 PO) Take 1 tablet by mouth every morning.     . fluticasone (FLONASE) 50 MCG/ACT nasal spray Place 2 sprays into the nose daily as needed for allergies.     . fluticasone (FLOVENT DISKUS)  50 MCG/BLIST diskus inhaler Inhale 2 puffs into the lungs 2 (two) times daily.    Marland Kitchen HYDROcodone-homatropine (HYCODAN) 5-1.5 MG/5ML syrup Take 5 mLs by mouth every 6 (six) hours as needed for cough.    . Multiple Vitamin (MULTIVITAMIN WITH MINERALS) TABS Take 1 tablet by mouth every morning.     . naproxen sodium (ANAPROX) 220 MG tablet Take 220 mg by mouth 2 (two) times daily as needed (pain).    . Omega-3 Fatty Acids (FISH OIL PO) Take 1 capsule by mouth every morning.    Marland Kitchen omeprazole (PRILOSEC) 10 MG capsule Take 1 capsule by mouth daily.    . sildenafil (VIAGRA) 50 MG tablet Take 50 mg by mouth daily as needed for erectile dysfunction.    . Tamsulosin HCl  (FLOMAX) 0.4 MG CAPS Take 0.4 mg by mouth daily after supper.      No facility-administered medications prior to visit.      Allergies:   Penicillins   Social History   Socioeconomic History  . Marital status: Married    Spouse name: Not on file  . Number of children: Not on file  . Years of education: Not on file  . Highest education level: Not on file  Occupational History  . Occupation: retired  Scientific laboratory technician  . Financial resource strain: Not on file  . Food insecurity:    Worry: Not on file    Inability: Not on file  . Transportation needs:    Medical: Not on file    Non-medical: Not on file  Tobacco Use  . Smoking status: Former Smoker    Types: Cigarettes    Start date: 02/04/1971    Last attempt to quit: 02/03/1972    Years since quitting: 45.6  . Smokeless tobacco: Current User    Types: Chew  Substance and Sexual Activity  . Alcohol use: Yes    Comment: rare  . Drug use: No  . Sexual activity: Not on file  Lifestyle  . Physical activity:    Days per week: Not on file    Minutes per session: Not on file  . Stress: Not on file  Relationships  . Social connections:    Talks on phone: Not on file    Gets together: Not on file    Attends religious service: Not on file    Active member of club or organization: Not on file    Attends meetings of clubs or organizations: Not on file    Relationship status: Not on file  Other Topics Concern  . Not on file  Social History Narrative   Retired Chief Financial Officer at [employer]    He is a Physicist, medical and enjoys renovation log cabins       Family History:  The patient's family history includes Cancer in his father; Diabetes in his sister and son; Heart attack in his mother; Hypertension in his father.   ROS:   Please see the history of present illness.    Review of Systems  All other systems reviewed and are negative.     PHYSICAL EXAM:   VS:  BP 120/70   Pulse 80   Ht 6\' 1"  (1.854 m)   Wt 231 lb 9.6 oz (105.1  kg)   SpO2 96%   BMI 30.56 kg/m    GEN: Well nourished, well developed, in no acute distress  HEENT: normal  Neck: no JVD, carotid bruits, or masses Cardiac: RRR; 1/6 SM, no rubs, or gallops,no edema  Respiratory:  clear to auscultation bilaterally, normal work of breathing GI: soft, nontender, nondistended, + BS MS: no deformity or atrophy  Skin: warm and dry, no rash Neuro:  Alert and Oriented x 3, Strength and sensation are intact Psych: euthymic mood, full affect     Wt Readings from Last 3 Encounters:  09/28/17 231 lb 9.6 oz (105.1 kg)  03/31/17 236 lb 3.2 oz (107.1 kg)  10/02/16 225 lb (102.1 kg)      Studies/Labs Reviewed:   EKG:  10/02/16-sinus rhythm NSR65 with no other abnormalities other than nonspecific T-wave change personally viewed.  Recent Labs: No results found for requested labs within last 8760 hours.   Lipid Panel    Component Value Date/Time   CHOL 113 (L) 10/15/2015 0811   TRIG 141 10/15/2015 0811   HDL 29 (L) 10/15/2015 0811   CHOLHDL 3.9 10/15/2015 0811   VLDL 28 10/15/2015 0811   LDLCALC 56 10/15/2015 0811    Additional studies/ records that were reviewed today include:   ECHO 04/17/14  - Left ventricle: The cavity size was normal. Wall thickness was increased in a pattern of mild LVH. There was mild focal basal hypertrophy of the septum. Systolic function was normal. The estimated ejection fraction was in the range of 60% to 65%. Wall motion was normal; there were no regional wall motion abnormalities. Doppler parameters are consistent with abnormal left ventricular relaxation (grade 1 diastolic dysfunction). - Aortic valve: A bioprosthesis was present. There was trivial regurgitation. Mean gradient (S): 17 mm Hg. Peak gradient (S): 29 mm Hg. - Ascending aorta: The ascending aorta was mildly dilated. - Mitral valve: Calcified annulus. There was mild regurgitation. - Left atrium: The atrium was mildly dilated. - Right  atrium: The atrium was mildly dilated.  ECHO 09/28/17: - Left ventricle: The cavity size was mildly dilated. Systolic   function was vigorous. The estimated ejection fraction was in the   range of 65% to 70%. Wall motion was normal; there were no   regional wall motion abnormalities. Doppler parameters are   consistent with abnormal left ventricular relaxation (grade 1   diastolic dysfunction). There was no evidence of elevated   ventricular filling pressure by Doppler parameters. - Aortic valve: Poorly visualized. There was mild stenosis. There   was trivial regurgitation. Mean gradient (S): 16 mm Hg. Peak   gradient (S): 26 mm Hg. - Mitral valve: Calcified annulus. Mildly thickened leaflets . - Left atrium: The atrium was moderately dilated. - Right ventricle: The cavity size was normal. Wall thickness was   normal. Systolic function was normal. - Right atrium: The atrium was moderately dilated. - Tricuspid valve: There was mild regurgitation. - Pulmonary arteries: Systolic pressure was mildly to moderately   increased. PA peak pressure: 40 mm Hg (S). - Inferior vena cava: The vessel was normal in size. - Pericardium, extracardiac: There was no pericardial effusion.   ASSESSMENT:    1. H/O aortic valve replacement   2. Shortness of breath   3. Hypercholesteremia   4. Chest pain, unspecified type   5. Long-term use of high-risk medication      PLAN:  In order of problems listed above:  Atypical chest pain/shortness of breath -I will check a nuclear stress test to make sure that he is doing well.  History of aortic valve replacement  - Echocardiogram reassuring in 2016. Very thankful. Dental prophylaxis.  - Overall doing well. No change in murmur.  -Echocardiogram 04/2017 is reassuring.  Hyperlipidemia  -  Low-dose atorvastatin 10 mg. Mildly elevated bilirubin in the past. Doing well. No myalgia.   Prostate cancer  - Previous Gleason score is 6. Dr. Rosana Hoes has been  monitoring.PSA has been unchanged. Good news.   Obesity  - Continue to encourage weight loss.  - Gym. Continue to work on this.     Medication Adjustments/Labs and Tests Ordered: Current medicines are reviewed at length with the patient today.  Concerns regarding medicines are outlined above.  Medication changes, Labs and Tests ordered today are listed in the Patient Instructions below. Patient Instructions  Medication Instructions:  The current medical regimen is effective;  continue present plan and medications.  Labwork: Please have blood work today. (Lipid, ALT, CBC and BMP)  Testing/Procedures: Your physician has requested that you have a myoview. For further information please visit [email or web address]. Please follow instruction sheet, as given.  Follow-Up: Follow up in 6 months with Dr. Marlou Porch.  You will receive a letter in the mail 2 months before you are due.  Please call us when you receive this letter to schedule your follow up appointment.  If you need a refill on your cardiac medications before your next appointment, please call your pharmacy.  Thank you for choosing [facility]!!        Signed, Candee Furbish, MD  09/28/2017 2:54 PM    [address] [place], [place], [place]  [address] Phone: [phone]; Fax: [phone]

## 2017-09-28 NOTE — Patient Instructions (Signed)
Medication Instructions:  The current medical regimen is effective;  continue present plan and medications.  Labwork: Please have blood work today. (Lipid, ALT, CBC and BMP)  Testing/Procedures: Your physician has requested that you have a myoview. For further information please visit [email or web address]. Please follow instruction sheet, as given.  Follow-Up: Follow up in 6 months with Dr. Marlou Porch.  You will receive a letter in the mail 2 months before you are due.  Please call us when you receive this letter to schedule your follow up appointment.  If you need a refill on your cardiac medications before your next appointment, please call your pharmacy.  Thank you for choosing [facility]!!

## 2017-09-29 ENCOUNTER — Encounter: Payer: Self-pay | Admitting: *Deleted

## 2017-09-29 LAB — LIPID PANEL
CHOL/HDL RATIO: 3.2 ratio (ref 0.0–5.0)
Cholesterol, Total: 106 mg/dL (ref 100–199)
HDL: 33 mg/dL — AB (ref >39)
LDL Calculated: 46 mg/dL (ref 0–99)
Triglycerides: 137 mg/dL (ref 0–149)
VLDL CHOLESTEROL CAL: 27 mg/dL (ref 5–40)

## 2017-09-29 LAB — BASIC METABOLIC PANEL
BUN/Creatinine Ratio: 11 (ref 10–24)
BUN: 13 mg/dL (ref 8–27)
CALCIUM: 9.2 mg/dL (ref 8.6–10.2)
CO2: 22 mmol/L (ref 20–29)
CREATININE: 1.19 mg/dL (ref 0.76–1.27)
Chloride: 103 mmol/L (ref 96–106)
GFR, EST AFRICAN AMERICAN: 68 mL/min/{1.73_m2} (ref >59)
GFR, EST NON AFRICAN AMERICAN: 59 mL/min/{1.73_m2} — AB (ref >59)
Glucose: 110 mg/dL — ABNORMAL HIGH (ref 65–99)
Potassium: 3.9 mmol/L (ref 3.5–5.2)
SODIUM: 140 mmol/L (ref 134–144)

## 2017-09-29 LAB — CBC
HEMOGLOBIN: 13.9 g/dL (ref 13.0–17.7)
Hematocrit: 41.2 % (ref 37.5–51.0)
MCH: 29.1 pg (ref 26.6–33.0)
MCHC: 33.7 g/dL (ref 31.5–35.7)
MCV: 86 fL (ref 79–97)
Platelets: 273 10*3/uL (ref 150–450)
RBC: 4.78 x10E6/uL (ref 4.14–5.80)
RDW: 13.2 % (ref 12.3–15.4)
WBC: 9.6 10*3/uL (ref 3.4–10.8)

## 2017-09-29 LAB — ALT: ALT: 20 IU/L (ref 0–44)

## 2017-10-16 ENCOUNTER — Encounter (HOSPITAL_COMMUNITY): Payer: Medicare Other

## 2017-10-16 ENCOUNTER — Ambulatory Visit (HOSPITAL_COMMUNITY)
Admission: RE | Admit: 2017-10-16 | Discharge: 2017-10-16 | Disposition: A | Discharge status: Home / Self Care | Payer: Medicare Other | Source: Ambulatory Visit | Attending: Cardiovascular Disease | Admitting: Cardiovascular Disease

## 2017-10-16 DIAGNOSIS — R079 Chest pain, unspecified: Secondary | ICD-10-CM | POA: Diagnosis present

## 2017-10-16 DIAGNOSIS — R0602 Shortness of breath: Secondary | ICD-10-CM | POA: Diagnosis present

## 2017-10-16 LAB — MYOCARDIAL PERFUSION IMAGING
CHL CUP NUCLEAR SDS: 1
CHL CUP NUCLEAR SRS: 4
CHL CUP NUCLEAR SSS: 5
CSEPHR: 92 %
Estimated workload: 7.2 METS
Exercise duration (min): 6 min
Exercise duration (sec): 0 s
MPHR: 144 {beats}/min
NUC STRESS TID: 1.05
Peak HR: 133 {beats}/min
RPE: 19
Rest HR: 65 {beats}/min

## 2017-10-16 MED ORDER — TECHNETIUM TC 99M TETROFOSMIN IV KIT
10.8000 | PACK | Freq: Once | INTRAVENOUS | Status: AC | PRN
  Administered 2017-10-16: 10.8 via INTRAVENOUS
  Filled 2017-10-16: qty 11

## 2017-10-16 MED ORDER — TECHNETIUM TC 99M TETROFOSMIN IV KIT
32.0000 | PACK | Freq: Once | INTRAVENOUS | Status: AC | PRN
  Administered 2017-10-16: 32 via INTRAVENOUS
  Filled 2017-10-16: qty 32

## 2017-10-19 ENCOUNTER — Telehealth: Payer: Self-pay | Admitting: Cardiology

## 2017-10-19 NOTE — Telephone Encounter (Signed)
New Message:     Patient is calling for Myocardial perfusion results

## 2017-10-19 NOTE — Telephone Encounter (Signed)
Left stress test results for patient, but told him that if he had any questions that he may call us.

## 2017-10-20 ENCOUNTER — Telehealth: Payer: Self-pay

## 2017-10-20 NOTE — Telephone Encounter (Signed)
Notes recorded by Frederik Schmidt, RN on 10/20/2017 at 8:48 AM EDT Left patient a message with results from stress test. I told him to call if he has questions. 9/17 ------

## 2017-10-20 NOTE — Telephone Encounter (Signed)
-----   Message from Jerline Pain, MD sent at 10/19/2017  2:51 PM EDT ----- Low risk study, no areas of significant ischemia identified.  Overall reassuring. Candee Furbish, MD

## 2018-04-21 ENCOUNTER — Telehealth: Payer: Self-pay

## 2018-04-21 NOTE — Telephone Encounter (Signed)
Tim Hardy is scheduled with Dr. Marlou Porch next week. Called to see how he is doing and to reschedule to a later date if possible due to COVID-19 pandemic.  Left message to call back.

## 2018-04-22 NOTE — Telephone Encounter (Signed)
Patient called and rescheduled

## 2018-04-27 ENCOUNTER — Ambulatory Visit: Payer: Medicare Other | Admitting: Cardiology

## 2018-05-31 ENCOUNTER — Telehealth: Payer: Self-pay | Admitting: *Deleted

## 2018-05-31 NOTE — Telephone Encounter (Signed)
Reviewed virtual visit vs phone visit with patient.  He does not have a smart phone and therefore will only be able to do a phone visit.  Reviewed consent and obtain verbal consent to move forward with phone visit.

## 2018-06-08 ENCOUNTER — Telehealth (INDEPENDENT_AMBULATORY_CARE_PROVIDER_SITE_OTHER): Payer: Medicare Other | Admitting: Cardiology

## 2018-06-08 ENCOUNTER — Encounter: Payer: Self-pay | Admitting: Cardiology

## 2018-06-08 ENCOUNTER — Other Ambulatory Visit: Payer: Self-pay

## 2018-06-08 VITALS — BP 164/90 | HR 69 | Ht 73.0 in | Wt 225.0 lb

## 2018-06-08 DIAGNOSIS — I359 Nonrheumatic aortic valve disorder, unspecified: Secondary | ICD-10-CM

## 2018-06-08 DIAGNOSIS — C61 Malignant neoplasm of prostate: Secondary | ICD-10-CM

## 2018-06-08 DIAGNOSIS — Z952 Presence of prosthetic heart valve: Secondary | ICD-10-CM

## 2018-06-08 DIAGNOSIS — E7800 Pure hypercholesterolemia, unspecified: Secondary | ICD-10-CM

## 2018-06-08 NOTE — Progress Notes (Signed)
Virtual Visit via Telephone Note   This visit type was conducted due to national recommendations for restrictions regarding the COVID-19 Pandemic (e.g. social distancing) in an effort to limit this patient's exposure and mitigate transmission in our community.  Due to his co-morbid illnesses, this patient is at least at moderate risk for complications without adequate follow up.  This format is felt to be most appropriate for this patient at this time.  The patient did not have access to video technology/had technical difficulties with video requiring transitioning to audio format only (telephone).  All issues noted in this document were discussed and addressed.  No physical exam could be performed with this format.  Please refer to the patient's chart for his  consent to telehealth for [facility].   Date:  06/08/2018   ID:  Tim Hardy, DOB [birth year], MRN [id number]  Patient Location: Home Provider Location: Home  PCP:  Lavone Orn, MD  Cardiologist:  No primary care provider on file.  Electrophysiologist:  None   Evaluation Performed:  Follow-Up Visit  Chief Complaint: Aortic stenosis follow-up  History of Present Illness:    Tim Hardy is a 78 y.o. 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  I previously took care of his mother.  Back at last visit he was feeling some increased shortness of breath and difficulty breathing over the summertime.  Some fleeting chest discomfort, 5 minutes or so.  He underwent a nuclear stress test on 10/16/2017-low risk with no significant ischemia identified.  Echocardiogram on 04/21/2017 was also performed showing normal EF with normal functioning bioprosthetic aortic valve-mean gradient 16 mmHg.  No chest pain. No HA. No diaphoresis. No fever. Seem to get tired.  Overall been doing quite well.  Taking care of the farm.  Beautiful flowers he states.  Would like me  to come visit.  BP usually ok in office, 120's, today on his cuff, elevated.  Continue to monitor.   The patient does not have symptoms concerning for COVID-19 infection (fever, chills, cough, or new shortness of breath).    Past Medical History:  Diagnosis Date  . Aortic valve stenosis, moderate    LVH  . Asthma   . Basal cell adenocarcinoma   . BPH (benign prostatic hyperplasia)   . Bronchitis   . Cancer [facility])    skin cancer removed in August  . Colon polyps   . Colon, diverticulosis   . Diastolic dysfunction   . ED (erectile dysfunction)   . GERD (gastroesophageal reflux disease)   . Heart murmur   . Hiatal hernia   . Perennial allergic rhinitis   . Shortness of breath    Past Surgical History:  Procedure Laterality Date  . AORTIC VALVE REPLACEMENT  11/03/2011   Procedure: AORTIC VALVE REPLACEMENT (AVR);  Surgeon: Melrose Nakayama, MD;  Location: [place];  Service: Open Heart Surgery;  Laterality: N/A;  Partial Sternotomy  . APPENDECTOMY    . CARDIAC CATHETERIZATION    . COLONOSCOPY W/ POLYPECTOMY    . COLONOSCOPY WITH PROPOFOL N/A 07/11/2014   Procedure: COLONOSCOPY WITH PROPOFOL;  Surgeon: Garlan Fair, MD;  Location: [facility];  Service: Endoscopy;  Laterality: N/A;  . ESOPHAGOGASTRODUODENOSCOPY (EGD) WITH PROPOFOL N/A 07/11/2014   Procedure: ESOPHAGOGASTRODUODENOSCOPY (EGD) WITH PROPOFOL;  Surgeon: Garlan Fair, MD;  Location: [facility];  Service: Endoscopy;  Laterality: N/A;  . FEMORAL HERNIA REPAIR     , x5  .  HERNIA REPAIR Bilateral   . KNEE ARTHROSCOPY Bilateral   . SHOULDER ARTHROSCOPY Right   . STERNOTOMY  11/03/2011   Procedure: STERNOTOMY;  Surgeon: Melrose Nakayama, MD;  Location: [place];  Service: Open Heart Surgery;  Laterality: N/A;  Partial  . TONSILLECTOMY       Current Meds  Medication Sig  . aspirin EC 81 MG tablet Take 81 mg by mouth every morning.   Marland Kitchen atorvastatin (LIPITOR) 10 MG tablet Take 5 mg by mouth daily.  . cetirizine  (ZYRTEC) 10 MG tablet Take 10 mg by mouth every morning.   . Cholecalciferol (VITAMIN D PO) Take 1 tablet by mouth every morning.  . clindamycin (CLEOCIN) 150 MG capsule Take 4 capsules by mouth as needed. Take 4 capsules before any dental procedures  . Cyanocobalamin (B-12 PO) Take 1 tablet by mouth every morning.   . fluticasone (FLONASE) 50 MCG/ACT nasal spray Place 2 sprays into the nose daily as needed for allergies.   . fluticasone (FLOVENT DISKUS) 50 MCG/BLIST diskus inhaler Inhale 2 puffs into the lungs 2 (two) times daily.  Marland Kitchen HYDROcodone-homatropine (HYCODAN) 5-1.5 MG/5ML syrup Take 5 mLs by mouth every 6 (six) hours as needed for cough.  . Multiple Vitamin (MULTIVITAMIN WITH MINERALS) TABS Take 1 tablet by mouth every morning.   . naproxen sodium (ANAPROX) 220 MG tablet Take 220 mg by mouth 2 (two) times daily as needed (pain).  . Omega-3 Fatty Acids (FISH OIL PO) Take 1 capsule by mouth every morning.  Marland Kitchen omeprazole (PRILOSEC) 10 MG capsule Take 1 capsule by mouth daily.  . sildenafil (VIAGRA) 50 MG tablet Take 50 mg by mouth daily as needed for erectile dysfunction.  . Tamsulosin HCl (FLOMAX) 0.4 MG CAPS Take 0.4 mg by mouth daily after supper.      Allergies:   Penicillins   Social History   Tobacco Use  . Smoking status: Former Smoker    Types: Cigarettes    Start date: 02/04/1971    Last attempt to quit: 02/03/1972    Years since quitting: 46.3  . Smokeless tobacco: Current User    Types: Chew  Substance Use Topics  . Alcohol use: Yes    Comment: rare  . Drug use: No     Family Hx: The patient's family history includes Cancer in his father; Diabetes in his sister and son; Heart attack in his mother; Hypertension in his father.  ROS:   Please see the history of present illness.    Denies any fevers chills nausea vomiting syncope bleeding All other systems reviewed and are negative.   Prior CV studies:   The following studies were reviewed today:  Echo nuclear  reviewed  Labs/Other Tests and Data Reviewed:    EKG:  An ECG dated 10/02/16 was personally reviewed today and demonstrated:  Sinus rhythm with nonspecific ST changes  Recent Labs: 09/28/2017: ALT 20; BUN 13; Creatinine, Ser 1.19; Hemoglobin 13.9; Platelets 273; Potassium 3.9; Sodium 140   Recent Lipid Panel Lab Results  Component Value Date/Time   CHOL 106 09/28/2017 02:44 PM   TRIG 137 09/28/2017 02:44 PM   HDL 33 (L) 09/28/2017 02:44 PM   CHOLHDL 3.2 09/28/2017 02:44 PM   CHOLHDL 3.9 10/15/2015 08:11 AM   LDLCALC 46 09/28/2017 02:44 PM    Wt Readings from Last 3 Encounters:  06/08/18 225 lb (102.1 kg)  10/16/17 231 lb (104.8 kg)  09/28/17 231 lb 9.6 oz (105.1 kg)     Objective:  Vital Signs:  BP (!) 164/90   Pulse 69   Ht 6\' 1"  (1.854 m)   Wt 225 lb (102.1 kg)   BMI 29.69 kg/m    VITAL SIGNS:  reviewed  Able to complete full sentences without difficulty on the telephone.  Pleasant.  Alert.  ASSESSMENT & PLAN:    Aortic stenosis status post aortic valve replacement bioprosthetic - Echocardiogram 2019, stable, mean gradient 16. -Dental prophylaxis once again discussed.  He takes amoxicillin.  Aspirin.  Mild nonobstructive coronary artery disease of LAD - Thankfully, nuclear stress test did not show any evidence of ischemia. -Continue with secondary risk factor prevention.  Hyperlipidemia - Continue with low-dose atorvastatin.  No myalgias.  Had mildly elevated bilirubin in the past.  Stable.  Prostate cancer - Dr. Rosana Hoes.  PSA has fluctuated slightly but overall he is doing well.    COVID-19 Education: The signs and symptoms of COVID-19 were discussed with the patient and how to seek care for testing (follow up with PCP or arrange E-visit).  The importance of social distancing was discussed today.  Time:   Today, I have spent 11 minutes with the patient with telehealth technology discussing the above problems.     Medication Adjustments/Labs and Tests  Ordered: Current medicines are reviewed at length with the patient today.  Concerns regarding medicines are outlined above.   Tests Ordered: No orders of the defined types were placed in this encounter.   Medication Changes: No orders of the defined types were placed in this encounter.   Disposition:  Follow up in 6 month(s)  Signed, Candee Furbish, MD  06/08/2018 2:48 PM    [place]

## 2018-06-08 NOTE — Patient Instructions (Signed)
Medication Instructions:  The current medical regimen is effective;  continue present plan and medications.  If you need a refill on your cardiac medications before your next appointment, please call your pharmacy.   Follow-Up: Follow up in 6 months with Dr. Skains.  You will receive a letter in the mail 2 months before you are due.  Please call us when you receive this letter to schedule your follow up appointment.   Thank you for choosing [facility]!!       

## 2018-06-09 ENCOUNTER — Telehealth: Payer: Medicare Other | Admitting: Cardiology

## 2018-09-17 ENCOUNTER — Other Ambulatory Visit: Payer: Self-pay | Admitting: General Surgery

## 2018-09-20 ENCOUNTER — Telehealth: Payer: Self-pay

## 2018-09-20 ENCOUNTER — Telehealth: Payer: Self-pay | Admitting: *Deleted

## 2018-09-20 NOTE — Telephone Encounter (Signed)
Left voice mail to call back 

## 2018-09-20 NOTE — Telephone Encounter (Signed)
NOTES ON FILE FROM [facility] 

## 2018-09-20 NOTE — Telephone Encounter (Signed)
   [facility] Pre-operative Risk Assessment    Request for surgical clearance:  1. What type of surgery is being performed? UMBILICAL HERNIA REPAIR   2. When is this surgery scheduled? TBD   3. What type of clearance is required (medical clearance vs. Pharmacy clearance to hold med vs. Both)? MEDICAL  4. Are there any medications that need to be held prior to surgery and how long? ASA X 5 DAYS PRIOR   5. Practice name and name of physician performing surgery? [facility]; DR. Renelda Loma INGRAM   6. What is your office phone number [phone]    7.   What is your office fax number [phone]  8.   Anesthesia type (None, local, MAC, general) ? GENERAL   Tim Hardy 09/20/2018, 11:25 AM  _________________________________________________________________   (provider comments below)

## 2018-09-21 NOTE — Telephone Encounter (Signed)
   Primary Cardiologist: Tim Furbish, MD  Chart reviewed as part of pre-operative protocol coverage. Patient was contacted 09/21/2018 in reference to pre-operative risk assessment for pending surgery as outlined below.  Tim Hardy was last seen on 06/08/2018 by Dr. Marlou Porch.  Since that day, Tim Hardy has done well from a cardiac standpoint. He continues to walk 1+ miles every day and work on historic log homes. No complaints of chest pain or SOB with those activities. No palpitations. He can easily complete 4 METs without anginal complaints.  Therefore, based on ACC/AHA guidelines, the patient would be at acceptable risk for the planned procedure without further cardiovascular testing.   Patient can hold aspirin 5 days prior to his upcoming hernia repair which should be restarted once cleared to do so by Dr. Dalbert Batman.   I will route this recommendation to the requesting party via [facility] fax function and remove from pre-op pool.  Please call with questions.  Abigail Butts, PA-C 09/21/2018, 3:33 PM

## 2018-10-14 ENCOUNTER — Other Ambulatory Visit: Payer: Self-pay

## 2018-10-14 ENCOUNTER — Encounter (HOSPITAL_BASED_OUTPATIENT_CLINIC_OR_DEPARTMENT_OTHER): Payer: Self-pay | Admitting: *Deleted

## 2018-10-14 ENCOUNTER — Encounter (HOSPITAL_BASED_OUTPATIENT_CLINIC_OR_DEPARTMENT_OTHER)
Admission: RE | Admit: 2018-10-14 | Discharge: 2018-10-14 | Disposition: A | Discharge status: Home / Self Care | Payer: Medicare Other | Source: Ambulatory Visit | Attending: General Surgery | Admitting: General Surgery

## 2018-10-14 DIAGNOSIS — Z01812 Encounter for preprocedural laboratory examination: Secondary | ICD-10-CM | POA: Diagnosis present

## 2018-10-14 DIAGNOSIS — Z20828 Contact with and (suspected) exposure to other viral communicable diseases: Secondary | ICD-10-CM | POA: Diagnosis not present

## 2018-10-14 LAB — COMPREHENSIVE METABOLIC PANEL
ALT: 24 U/L (ref 0–44)
AST: 22 U/L (ref 15–41)
Albumin: 3.7 g/dL (ref 3.5–5.0)
Alkaline Phosphatase: 47 U/L (ref 38–126)
Anion gap: 11 (ref 5–15)
BUN: 13 mg/dL (ref 8–23)
CO2: 24 mmol/L (ref 22–32)
Calcium: 8.4 mg/dL — ABNORMAL LOW (ref 8.9–10.3)
Chloride: 103 mmol/L (ref 98–111)
Creatinine, Ser: 1.15 mg/dL (ref 0.61–1.24)
GFR calc Af Amer: >60 mL/min (ref >60)
GFR calc non Af Amer: >60 mL/min (ref >60)
Glucose, Bld: 114 mg/dL — ABNORMAL HIGH (ref 70–99)
Potassium: 3.9 mmol/L (ref 3.5–5.1)
Sodium: 138 mmol/L (ref 135–145)
Total Bilirubin: 1.2 mg/dL (ref 0.3–1.2)
Total Protein: 6 g/dL — ABNORMAL LOW (ref 6.5–8.1)

## 2018-10-14 LAB — CBC WITH DIFFERENTIAL/PLATELET
Abs Immature Granulocytes: 0.03 10*3/uL (ref 0.00–0.07)
Basophils Absolute: 0.1 10*3/uL (ref 0.0–0.1)
Basophils Relative: 1 %
Eosinophils Absolute: 0.5 10*3/uL (ref 0.0–0.5)
Eosinophils Relative: 5 %
HCT: 42.9 % (ref 39.0–52.0)
Hemoglobin: 14.1 g/dL (ref 13.0–17.0)
Immature Granulocytes: 0 %
Lymphocytes Relative: 29 %
Lymphs Abs: 2.7 10*3/uL (ref 0.7–4.0)
MCH: 30.5 pg (ref 26.0–34.0)
MCHC: 32.9 g/dL (ref 30.0–36.0)
MCV: 92.9 fL (ref 80.0–100.0)
Monocytes Absolute: 0.7 10*3/uL (ref 0.1–1.0)
Monocytes Relative: 8 %
Neutro Abs: 5.2 10*3/uL (ref 1.7–7.7)
Neutrophils Relative %: 57 %
Platelets: 245 10*3/uL (ref 150–400)
RBC: 4.62 MIL/uL (ref 4.22–5.81)
RDW: 12.4 % (ref 11.5–15.5)
WBC: 9.1 10*3/uL (ref 4.0–10.5)
nRBC: 0 % (ref 0.0–0.2)

## 2018-10-16 ENCOUNTER — Other Ambulatory Visit (HOSPITAL_COMMUNITY)
Admission: RE | Admit: 2018-10-16 | Discharge: 2018-10-16 | Disposition: A | Discharge status: Home / Self Care | Payer: Medicare Other | Source: Ambulatory Visit | Attending: General Surgery | Admitting: General Surgery

## 2018-10-16 DIAGNOSIS — Z01812 Encounter for preprocedural laboratory examination: Secondary | ICD-10-CM | POA: Diagnosis not present

## 2018-10-16 NOTE — H&P (Signed)
Tim Hardy Location: [facility] Patient #: [id number] DOB: 12/25/1940 Widowed / Language: Tim Hardy / Race: White Male      History of Present Illness     This is a very nice 78 year old man, referred by Dr. Lavone Hardy for evaluation of enlarging umbilical hernia. Dr. Marlou Hardy is his cardiologist. Dr. Roxan Hardy performed his aortic valve replacement.      He has a history of 5 surgeries for inguinal hernia 3 on one side and 2 on the other. They have remained repaired and he has no symptoms down there. He says he's had a bulge at his umbilicus for 4 years. It is getting larger. He sometimes has to push it back in. Occasionally a little pain. No incarceration.      Past history reveals aortic valve replacement in 2013. Takes aspirin only. Chronic asthma. GERD. BPH. Hyperlipidemia. 5 inguinal hernia surgeries. Knee and shoulder surgery Family history father deceased age 1 lung and colon cancer mother deceased age 65 atrial fibrillation sizers history reveals he is a widowed. Retired from [employer]. As a foreman restores clotted cabins. Doesn't do heavy work himself. Has 3 sons. Denies tobacco. Does chew. Alcohol rare      Exam reveals a large umbilical hernia with an 8 cm sac and a 2 cm defect. Reducible. He agrees this time to have it repaired. He will be scheduled for open repair of umbilical hernia with inlay mesh. I discussed the indications, details, techniques, and numerous risk of the surgery with him. He is aware the risks of bleeding, infection, recurrence of the hernia, chronic pain from nerve damage. Rarely injury to the intestine with major reconstructive surgery. Cardiac pulmonary thromboembolic problems. He understands all these issues. All of his questions are answered. He agrees with this plan  Cardiac risk assessment and clearance will be requested Stop aspirin 5 days preop   Past Surgical History Appendectomy  Tonsillectomy   Valve Replacement   Diagnostic Studies History  Colonoscopy  1-5 years ago  Allergies Penicillin V *PENICILLINS*   Medication History  ProAir HFA (108 (90 Base)MCG/ACT Aerosol Soln, Inhalation) Active. Atorvastatin Calcium (10MG  Tablet, Oral) Active. PriLOSEC OTC (20MG  Tablet DR, Oral) Active. Aspirin (81MG  Tablet, Oral) Active. Medications Reconciled  Social History Alcohol use  Occasional alcohol use. Caffeine use  Coffee, Tea. No drug use  Tobacco use  Former smoker.  Family History Cancer  Father. Colon Cancer  Father. Diabetes Mellitus  Sister, Son. Heart Disease  Mother. Hypertension  Father.  Other Problems  Asthma  Enlarged Prostate  Gastroesophageal Reflux Disease     Review of Systems Skin Not Present- Change in Wart/Mole, Dryness, Hives, Jaundice, New Lesions, Non-Healing Wounds, Rash and Ulcer. HEENT Present- Seasonal Allergies. Not Present- Earache, Hearing Loss, Hoarseness, Nose Bleed, Oral Ulcers, Ringing in the Ears, Sinus Pain, Sore Throat, Visual Disturbances, Wears glasses/contact lenses and Yellow Eyes. Cardiovascular Not Present- Chest Pain, Difficulty Breathing Lying Down, Leg Cramps, Palpitations, Rapid Heart Rate, Shortness of Breath and Swelling of Extremities. Neurological Not Present- Decreased Memory, Fainting, Headaches, Numbness, Seizures, Tingling, Tremor, Trouble walking and Weakness. Psychiatric Not Present- Anxiety, Bipolar, Change in Sleep Pattern, Depression, Fearful and Frequent crying. Endocrine Not Present- Cold Intolerance, Excessive Hunger, Hair Changes, Heat Intolerance, Hot flashes and New Diabetes. Hematology Not Present- Blood Thinners, Easy Bruising, Excessive bleeding, Gland problems, HIV and Persistent Infections.  Vitals  Weight: 234.5 lb Height: 73in Body Surface Area: 2.3 m Body Mass Index: 30.94 kg/m  Temp.: 97.43F (Oral)  Pulse:  91 (Regular)  BP: 142/82(Sitting, Left Arm,  Standard)     Physical Exam  General Mental Status-Alert. General Appearance-Consistent with stated age. Hydration-Well hydrated. Voice-Normal.  Head and Neck Head-normocephalic, atraumatic with no lesions or palpable masses. Trachea-midline. Thyroid Gland Characteristics - normal size and consistency.  Eye Eyeball - Bilateral-Extraocular movements intact. Sclera/Conjunctiva - Bilateral-No scleral icterus.  Chest and Lung Exam Chest and lung exam reveals -quiet, even and easy respiratory effort with no use of accessory muscles and on auscultation, normal breath sounds, no adventitious sounds and normal vocal resonance. Inspection Chest Wall - Normal. Back - normal. Note: Lungs clear. Regular rate and rhythm. Sternotomy scar well-healed   Breast Breast - Left-Symmetric, Non Tender, No Biopsy scars, no Dimpling - Left, No Inflammation, No Lumpectomy scars, No Mastectomy scars, No Peau d' Orange. Breast - Right-Symmetric, Non Tender, No Biopsy scars, no Dimpling - Right, No Inflammation, No Lumpectomy scars, No Mastectomy scars, No Peau d' Orange. Breast Lump-No Palpable Breast Mass.  Cardiovascular Cardiovascular examination reveals -normal heart sounds, regular rate and rhythm with no murmurs and normal pedal pulses bilaterally.  Abdomen Inspection  Inspection of the abdomen reveals: Note: Examined supine and standing. 8 cm umbilical hernia sac. Looks fairly flat when he lies down. Complete irreducible. Defect about 2 cm. Not really tender. Skin - Scar - no surgical scars. Palpation/Percussion Palpation and Percussion of the abdomen reveal - Soft, Non Tender, No Rebound tenderness, No Rigidity (guarding) and No hepatosplenomegaly. Auscultation Auscultation of the abdomen reveals - Bowel sounds normal.  Male Genitourinary Note: Examined standing. No inguinal mass. No scrotal mass. Inguinal hernia repairs appear  intact   Neurologic Neurologic evaluation reveals -alert and oriented x 3 with no impairment of recent or remote memory. Mental Status-Normal.  Musculoskeletal Normal Exam - Left-Upper Extremity Strength Normal and Lower Extremity Strength Normal. Normal Exam - Right-Upper Extremity Strength Normal and Lower Extremity Strength Normal.  Lymphatic Head & Neck  General Head & Neck Lymphatics: Bilateral - Description - Normal. Axillary  General Axillary Region: Bilateral - Description - Normal. Tenderness - Non Tender. Femoral & Inguinal  Generalized Femoral & Inguinal Lymphatics: Bilateral - Description - Normal. Tenderness - Non Tender.     Assessment & Plan  UMBILICAL HERNIA WITHOUT OBSTRUCTION OR GANGRENE    You have a reducible umbilical hernia The defect in the muscle is no more than 1 inch in diameter The hernia sac and bulge is probably about 8 cm in diameter This has been getting larger and it is time to have this repaired electively before you have a bigger problem  you will be scheduled for open repair of umbilical hernia with mesh We have discussed the indications, techniques, and risks of this surgery in detail We have discussed the temporary disability and return to normal activities  We will ask Dr. Marlou Hardy for a risk assessment from a cardiac standpoint you will need to stop your aspirin 5 days preop to lower the risk of bleeding Please read the patient information booklet that I reviewed with you    H/O AORTIC VALVE REPLACEMENT (Z95.2)  BMI 30.0-30.9,ADULT (Z68.30)  CHRONIC ASTHMA, MILD PERSISTENT, UNCOMPLICATED (A999333)  CHRONIC GERD (K21.9)  HISTORY OF BPH [id number])  HISTORY OF INGUINAL HERNIA REPAIR, BILATERAL (Z98.890)   Tim Hardy. Tim Hardy, M.D., [facility] General and Minimally invasive Surgery Breast and Colorectal Surgery Office:   [phone] Pager:   [phone]

## 2018-10-17 LAB — NOVEL CORONAVIRUS, NAA (HOSP ORDER, SEND-OUT TO REF LAB; TAT 18-24 HRS): SARS-CoV-2, NAA: NOT DETECTED

## 2018-10-19 NOTE — Anesthesia Preprocedure Evaluation (Addendum)
Anesthesia Evaluation  Patient identified by MRN, date of birth, ID band Patient awake    Reviewed: Allergy & Precautions, NPO status , Patient's Chart, lab work & pertinent test results  Airway Mallampati: II  TM Distance: >3 FB Neck ROM: Full    Dental no notable dental hx. (+) Implants   Pulmonary asthma , former smoker,    Pulmonary exam normal breath sounds clear to auscultation       Cardiovascular Exercise Tolerance: Good Normal cardiovascular exam+ Valvular Problems/Murmurs AS  Rhythm:Regular Rate:Normal  04/22/18 Echo  Left ventricle: The cavity size was mildly dilated. Systolic   function was vigorous. The estimated ejection fraction was in the   range of 65% to 70%. Wall motion was normal; there were no   regional wall motion abnormalities. Doppler parameters are   consistent with abnormal left ventricular relaxation (grade 1   diastolic dysfunction). There was no evidence of elevated   ventricular filling pressure by Doppler parameters. - Aortic valve: Poorly visualized. There was mild stenosis. There   was trivial regurgitation. Mean gradient (S): 16 mm Hg. Peak   gradient (S): 26 mm Hg.   Neuro/Psych negative neurological ROS  negative psych ROS   GI/Hepatic Neg liver ROS, GERD  ,  Endo/Other  negative endocrine ROS  Renal/GU Lab Results      Component                Value               Date                      CREATININE               1.15                10/14/2018                BUN                      13                  10/14/2018                NA                       138                 10/14/2018                K                        3.9                 10/14/2018                CL                       103                 10/14/2018                CO2                      24                  10/14/2018  Musculoskeletal negative musculoskeletal ROS (+)   Abdominal   Peds  Hematology Lab Results      Component                Value               Date                      WBC                      9.1                 10/14/2018                HGB                      14.1                10/14/2018                HCT                      42.9                10/14/2018                MCV                      92.9                10/14/2018                PLT                      245                 10/14/2018              Anesthesia Other Findings   Reproductive/Obstetrics                            Anesthesia Physical Anesthesia Plan  ASA: II  Anesthesia Plan: General   Post-op Pain Management:    Induction: Intravenous  PONV Risk Score and Plan: 3 and Treatment may vary due to age or medical condition, Ondansetron and Dexamethasone  Airway Management Planned: Oral ETT  Additional Equipment:   Intra-op Plan:   Post-operative Plan: Extubation in OR  Informed Consent: I have reviewed the patients History and Physical, chart, labs and discussed the procedure including the risks, benefits and alternatives for the proposed anesthesia with the patient or authorized representative who has indicated his/her understanding and acceptance.     Dental advisory given  Plan Discussed with:   Anesthesia Plan Comments: (Ga w Dexmedetomedine (0.3 mcg/kg))       Anesthesia Quick Evaluation

## 2018-10-20 ENCOUNTER — Ambulatory Visit (HOSPITAL_BASED_OUTPATIENT_CLINIC_OR_DEPARTMENT_OTHER): Payer: Medicare Other | Admitting: Certified Registered Nurse Anesthetist

## 2018-10-20 ENCOUNTER — Ambulatory Visit (HOSPITAL_BASED_OUTPATIENT_CLINIC_OR_DEPARTMENT_OTHER)
Admission: RE | Admit: 2018-10-20 | Discharge: 2018-10-20 | Disposition: A | Discharge status: Home / Self Care | Payer: Medicare Other | Attending: General Surgery | Admitting: General Surgery

## 2018-10-20 ENCOUNTER — Encounter (HOSPITAL_BASED_OUTPATIENT_CLINIC_OR_DEPARTMENT_OTHER)
Admission: RE | Disposition: A | Discharge status: Home / Self Care | Payer: Self-pay | Source: Home / Self Care | Attending: General Surgery

## 2018-10-20 ENCOUNTER — Encounter (HOSPITAL_BASED_OUTPATIENT_CLINIC_OR_DEPARTMENT_OTHER): Payer: Self-pay | Admitting: *Deleted

## 2018-10-20 ENCOUNTER — Other Ambulatory Visit: Payer: Self-pay

## 2018-10-20 DIAGNOSIS — J4530 Mild persistent asthma, uncomplicated: Secondary | ICD-10-CM | POA: Diagnosis not present

## 2018-10-20 DIAGNOSIS — K420 Umbilical hernia with obstruction, without gangrene: Secondary | ICD-10-CM | POA: Diagnosis present

## 2018-10-20 DIAGNOSIS — K219 Gastro-esophageal reflux disease without esophagitis: Secondary | ICD-10-CM | POA: Insufficient documentation

## 2018-10-20 DIAGNOSIS — Z7982 Long term (current) use of aspirin: Secondary | ICD-10-CM | POA: Diagnosis not present

## 2018-10-20 DIAGNOSIS — N400 Enlarged prostate without lower urinary tract symptoms: Secondary | ICD-10-CM | POA: Diagnosis not present

## 2018-10-20 DIAGNOSIS — Z880 Allergy status to penicillin: Secondary | ICD-10-CM | POA: Diagnosis not present

## 2018-10-20 DIAGNOSIS — Z952 Presence of prosthetic heart valve: Secondary | ICD-10-CM | POA: Diagnosis not present

## 2018-10-20 DIAGNOSIS — E785 Hyperlipidemia, unspecified: Secondary | ICD-10-CM | POA: Insufficient documentation

## 2018-10-20 DIAGNOSIS — N529 Male erectile dysfunction, unspecified: Secondary | ICD-10-CM | POA: Diagnosis not present

## 2018-10-20 DIAGNOSIS — Z79899 Other long term (current) drug therapy: Secondary | ICD-10-CM | POA: Diagnosis not present

## 2018-10-20 DIAGNOSIS — Z87891 Personal history of nicotine dependence: Secondary | ICD-10-CM | POA: Diagnosis not present

## 2018-10-20 HISTORY — DX: Umbilical hernia with obstruction, without gangrene: K42.0

## 2018-10-20 HISTORY — PX: UMBILICAL HERNIA REPAIR: SHX196

## 2018-10-20 SURGERY — REPAIR, HERNIA, UMBILICAL, ADULT
Anesthesia: General | Site: Abdomen

## 2018-10-20 MED ORDER — BUPIVACAINE-EPINEPHRINE 0.5% -1:200000 IJ SOLN
INTRAMUSCULAR | Status: DC | PRN
  Administered 2018-10-20: 9 mL

## 2018-10-20 MED ORDER — ACETAMINOPHEN 500 MG PO TABS
ORAL_TABLET | ORAL | Status: AC
  Filled 2018-10-20: qty 2

## 2018-10-20 MED ORDER — FENTANYL CITRATE (PF) 100 MCG/2ML IJ SOLN
INTRAMUSCULAR | Status: AC
  Filled 2018-10-20: qty 2

## 2018-10-20 MED ORDER — HYDROCODONE-ACETAMINOPHEN 5-325 MG PO TABS: 1.0000 | ORAL_TABLET | Freq: Four times a day (QID) | ORAL | 0 refills | Status: AC | PRN

## 2018-10-20 MED ORDER — DEXMEDETOMIDINE HCL 200 MCG/2ML IV SOLN
INTRAVENOUS | Status: DC | PRN
  Administered 2018-10-20 (×3): 8 ug via INTRAVENOUS

## 2018-10-20 MED ORDER — FENTANYL CITRATE (PF) 250 MCG/5ML IJ SOLN
INTRAMUSCULAR | Status: DC | PRN
  Administered 2018-10-20: 100 ug via INTRAVENOUS
  Administered 2018-10-20: 50 ug via INTRAVENOUS

## 2018-10-20 MED ORDER — PHENYLEPHRINE 40 MCG/ML (10ML) SYRINGE FOR IV PUSH (FOR BLOOD PRESSURE SUPPORT)
PREFILLED_SYRINGE | INTRAVENOUS | Status: DC | PRN
  Administered 2018-10-20: 80 ug via INTRAVENOUS
  Administered 2018-10-20: 120 ug via INTRAVENOUS
  Administered 2018-10-20: 80 ug via INTRAVENOUS

## 2018-10-20 MED ORDER — SILVER NITRATE-POT NITRATE 75-25 % EX MISC
CUTANEOUS | Status: AC
  Filled 2018-10-20: qty 1

## 2018-10-20 MED ORDER — DEXAMETHASONE SODIUM PHOSPHATE 10 MG/ML IJ SOLN
INTRAMUSCULAR | Status: AC
  Filled 2018-10-20: qty 1

## 2018-10-20 MED ORDER — CHLORHEXIDINE GLUCONATE CLOTH 2 % EX PADS: 6.0000 | MEDICATED_PAD | Freq: Once | CUTANEOUS | Status: DC

## 2018-10-20 MED ORDER — SILVER SULFADIAZINE 1 % EX CREA
TOPICAL_CREAM | CUTANEOUS | Status: AC
  Filled 2018-10-20: qty 85

## 2018-10-20 MED ORDER — ONDANSETRON HCL 4 MG/2ML IJ SOLN: 4.0000 mg | Freq: Once | INTRAMUSCULAR | Status: DC | PRN

## 2018-10-20 MED ORDER — GABAPENTIN 300 MG PO CAPS
300.0000 mg | ORAL_CAPSULE | ORAL | Status: AC
  Administered 2018-10-20: 07:00:00 300 mg via ORAL

## 2018-10-20 MED ORDER — PROPOFOL 10 MG/ML IV BOLUS
INTRAVENOUS | Status: DC | PRN
  Administered 2018-10-20: 110 mg via INTRAVENOUS

## 2018-10-20 MED ORDER — ROCURONIUM BROMIDE 10 MG/ML (PF) SYRINGE
PREFILLED_SYRINGE | INTRAVENOUS | Status: DC | PRN
  Administered 2018-10-20: 60 mg via INTRAVENOUS

## 2018-10-20 MED ORDER — PROPOFOL 10 MG/ML IV BOLUS
INTRAVENOUS | Status: AC
  Filled 2018-10-20: qty 20

## 2018-10-20 MED ORDER — EPHEDRINE 5 MG/ML INJ
INTRAVENOUS | Status: AC
  Filled 2018-10-20: qty 10

## 2018-10-20 MED ORDER — PHENYLEPHRINE 40 MCG/ML (10ML) SYRINGE FOR IV PUSH (FOR BLOOD PRESSURE SUPPORT)
PREFILLED_SYRINGE | INTRAVENOUS | Status: AC
  Filled 2018-10-20: qty 10

## 2018-10-20 MED ORDER — CEFAZOLIN SODIUM-DEXTROSE 2-4 GM/100ML-% IV SOLN
INTRAVENOUS | Status: AC
  Filled 2018-10-20: qty 100

## 2018-10-20 MED ORDER — ACETAMINOPHEN 10 MG/ML IV SOLN: 1000.0000 mg | Freq: Once | INTRAVENOUS | Status: DC | PRN

## 2018-10-20 MED ORDER — LACTATED RINGERS IV SOLN
INTRAVENOUS | Status: DC | PRN
  Administered 2018-10-20: 08:00:00 via INTRAVENOUS

## 2018-10-20 MED ORDER — GABAPENTIN 300 MG PO CAPS
ORAL_CAPSULE | ORAL | Status: AC
  Filled 2018-10-20: qty 1

## 2018-10-20 MED ORDER — LIDOCAINE 2% (20 MG/ML) 5 ML SYRINGE
INTRAMUSCULAR | Status: DC | PRN
  Administered 2018-10-20: 100 mg via INTRAVENOUS

## 2018-10-20 MED ORDER — FENTANYL CITRATE (PF) 100 MCG/2ML IJ SOLN
25.0000 ug | INTRAMUSCULAR | Status: DC | PRN
  Administered 2018-10-20 (×2): 25 ug via INTRAVENOUS

## 2018-10-20 MED ORDER — LIDOCAINE-EPINEPHRINE (PF) 1 %-1:200000 IJ SOLN
INTRAMUSCULAR | Status: AC
  Filled 2018-10-20: qty 30

## 2018-10-20 MED ORDER — FENTANYL CITRATE (PF) 100 MCG/2ML IJ SOLN: 50.0000 ug | INTRAMUSCULAR | Status: DC | PRN

## 2018-10-20 MED ORDER — BUPIVACAINE-EPINEPHRINE (PF) 0.5% -1:200000 IJ SOLN
INTRAMUSCULAR | Status: AC
  Filled 2018-10-20: qty 30

## 2018-10-20 MED ORDER — LACTATED RINGERS IV SOLN: INTRAVENOUS | Status: DC

## 2018-10-20 MED ORDER — ROCURONIUM BROMIDE 10 MG/ML (PF) SYRINGE
PREFILLED_SYRINGE | INTRAVENOUS | Status: AC
  Filled 2018-10-20: qty 10

## 2018-10-20 MED ORDER — ACETAMINOPHEN 500 MG PO TABS
1000.0000 mg | ORAL_TABLET | ORAL | Status: AC
  Administered 2018-10-20: 07:00:00 1000 mg via ORAL

## 2018-10-20 MED ORDER — DEXAMETHASONE SODIUM PHOSPHATE 10 MG/ML IJ SOLN
INTRAMUSCULAR | Status: DC | PRN
  Administered 2018-10-20: 4 mg via INTRAVENOUS

## 2018-10-20 MED ORDER — ONDANSETRON HCL 4 MG/2ML IJ SOLN
INTRAMUSCULAR | Status: AC
  Filled 2018-10-20: qty 2

## 2018-10-20 MED ORDER — SUGAMMADEX SODIUM 200 MG/2ML IV SOLN
INTRAVENOUS | Status: DC | PRN
  Administered 2018-10-20: 200 mg via INTRAVENOUS

## 2018-10-20 MED ORDER — ESMOLOL HCL 100 MG/10ML IV SOLN
INTRAVENOUS | Status: AC
  Filled 2018-10-20: qty 10

## 2018-10-20 MED ORDER — EPHEDRINE SULFATE-NACL 50-0.9 MG/10ML-% IV SOSY
PREFILLED_SYRINGE | INTRAVENOUS | Status: DC | PRN
  Administered 2018-10-20 (×2): 5 mg via INTRAVENOUS

## 2018-10-20 MED ORDER — MIDAZOLAM HCL 2 MG/2ML IJ SOLN: 1.0000 mg | INTRAMUSCULAR | Status: DC | PRN

## 2018-10-20 MED ORDER — ONDANSETRON HCL 4 MG/2ML IJ SOLN
INTRAMUSCULAR | Status: DC | PRN
  Administered 2018-10-20: 4 mg via INTRAVENOUS

## 2018-10-20 MED ORDER — DEXTROSE 5 % IV SOLN
3.0000 g | INTRAVENOUS | Status: AC
  Administered 2018-10-20: 2 g via INTRAVENOUS

## 2018-10-20 SURGICAL SUPPLY — 54 items
ADH SKN CLS APL DERMABOND .7 (GAUZE/BANDAGES/DRESSINGS) ×1
APL PRP STRL LF DISP 70% ISPRP (MISCELLANEOUS) ×1
APL SWBSTK 6 STRL LF DISP (MISCELLANEOUS) ×1
APPLICATOR COTTON TIP 6 STRL (MISCELLANEOUS) ×1 IMPLANT
APPLICATOR COTTON TIP 6IN STRL (MISCELLANEOUS) ×3
BLADE CLIPPER SURG (BLADE) ×2 IMPLANT
BLADE HEX COATED 2.75 (ELECTRODE) ×3 IMPLANT
BLADE SURG 10 STRL SS (BLADE) ×3 IMPLANT
BLADE SURG 15 STRL LF DISP TIS (BLADE) ×1 IMPLANT
BLADE SURG 15 STRL SS (BLADE) ×3
CANISTER SUCT 1200ML W/VALVE (MISCELLANEOUS) ×2 IMPLANT
CHLORAPREP W/TINT 26 (MISCELLANEOUS) ×3 IMPLANT
COVER BACK TABLE REUSABLE LG (DRAPES) ×3 IMPLANT
COVER MAYO STAND REUSABLE (DRAPES) ×3 IMPLANT
COVER WAND RF STERILE (DRAPES) IMPLANT
DECANTER SPIKE VIAL GLASS SM (MISCELLANEOUS) ×3 IMPLANT
DERMABOND ADVANCED (GAUZE/BANDAGES/DRESSINGS) ×2
DERMABOND ADVANCED .7 DNX12 (GAUZE/BANDAGES/DRESSINGS) ×1 IMPLANT
DRAPE LAPAROTOMY 100X72 PEDS (DRAPES) ×3 IMPLANT
DRAPE UTILITY XL STRL (DRAPES) ×3 IMPLANT
ELECT REM PT RETURN 9FT ADLT (ELECTROSURGICAL) ×3
ELECTRODE REM PT RTRN 9FT ADLT (ELECTROSURGICAL) ×1 IMPLANT
GAUZE SPONGE 4X4 12PLY STRL LF (GAUZE/BANDAGES/DRESSINGS) IMPLANT
GLOVE BIOGEL PI IND STRL 6.5 (GLOVE) IMPLANT
GLOVE BIOGEL PI IND STRL 7.0 (GLOVE) IMPLANT
GLOVE BIOGEL PI INDICATOR 6.5 (GLOVE) ×2
GLOVE BIOGEL PI INDICATOR 7.0 (GLOVE) ×2
GLOVE SS BIOGEL STRL SZ 7 (GLOVE) ×1 IMPLANT
GLOVE SUPERSENSE BIOGEL SZ 7 (GLOVE) ×2
GLOVE SURG SYN 7.5  E (GLOVE) ×2
GLOVE SURG SYN 7.5 E (GLOVE) ×1 IMPLANT
GLOVE SURG SYN 7.5 PF PI (GLOVE) IMPLANT
GOWN STRL REUS W/ TWL LRG LVL3 (GOWN DISPOSABLE) ×1 IMPLANT
GOWN STRL REUS W/ TWL XL LVL3 (GOWN DISPOSABLE) ×1 IMPLANT
GOWN STRL REUS W/TWL LRG LVL3 (GOWN DISPOSABLE)
GOWN STRL REUS W/TWL XL LVL3 (GOWN DISPOSABLE) ×6
MESH VENTRALEX ST 8CM LRG (Mesh General) ×2 IMPLANT
NDL HYPO 25X1 1.5 SAFETY (NEEDLE) ×1 IMPLANT
NEEDLE HYPO 25X1 1.5 SAFETY (NEEDLE) ×3 IMPLANT
PACK BASIN DAY SURGERY FS (CUSTOM PROCEDURE TRAY) ×3 IMPLANT
PENCIL BUTTON HOLSTER BLD 10FT (ELECTRODE) ×3 IMPLANT
SLEEVE SCD COMPRESS KNEE MED (MISCELLANEOUS) ×2 IMPLANT
SPONGE LAP 4X18 RFD (DISPOSABLE) ×3 IMPLANT
SUT MNCRL AB 4-0 PS2 18 (SUTURE) ×3 IMPLANT
SUT NOVA NAB DX-16 0-1 5-0 T12 (SUTURE) ×4 IMPLANT
SUT VICRYL 3-0 CR8 SH (SUTURE) ×3 IMPLANT
SUT VICRYL AB 2 0 TIE (SUTURE) IMPLANT
SUT VICRYL AB 2 0 TIES (SUTURE)
SYR 10ML LL (SYRINGE) ×3 IMPLANT
SYR BULB 3OZ (MISCELLANEOUS) IMPLANT
TOWEL GREEN STERILE FF (TOWEL DISPOSABLE) ×6 IMPLANT
TUBE CONNECTING 20'X1/4 (TUBING) ×1
TUBE CONNECTING 20X1/4 (TUBING) ×2 IMPLANT
YANKAUER SUCT BULB TIP NO VENT (SUCTIONS) ×3 IMPLANT

## 2018-10-20 NOTE — Anesthesia Postprocedure Evaluation (Signed)
Anesthesia Post Note  Patient: Tim Hardy  Procedure(s) Performed: OPEN REPAIR UMBILICAL HERNIA WITH  MESH (N/A Abdomen)     Patient location during evaluation: PACU Anesthesia Type: General Level of consciousness: awake and alert Pain management: pain level controlled Vital Signs Assessment: post-procedure vital signs reviewed and stable Respiratory status: spontaneous breathing, nonlabored ventilation, respiratory function stable and patient connected to nasal cannula oxygen Cardiovascular status: blood pressure returned to baseline and stable Postop Assessment: no apparent nausea or vomiting Anesthetic complications: no    Last Vitals:  Vitals:   10/20/18 1015 10/20/18 1053  BP: 125/75 (!) 149/78  Pulse: 62 61  Resp: 17 18  Temp:  36.4 C  SpO2: 93% 96%    Last Pain:  Vitals:   10/20/18 1053  TempSrc: Oral  PainSc: 0-No pain                 Barnet Glasgow

## 2018-10-20 NOTE — Transfer of Care (Signed)
Immediate Anesthesia Transfer of Care Note  Patient: Tim Hardy  Procedure(s) Performed: OPEN REPAIR UMBILICAL HERNIA WITH  MESH (N/A Abdomen)  Patient Location: PACU  Anesthesia Type:General  Level of Consciousness: drowsy  Airway & Oxygen Therapy: Patient Spontanous Breathing  Post-op Assessment: Report given to RN and Post -op Vital signs reviewed and stable  Post vital signs: Reviewed and stable  Last Vitals:  Vitals Value Taken Time  BP 124/71 10/20/18 0931  Temp    Pulse 65 10/20/18 0935  Resp 13 10/20/18 0936  SpO2 95 % 10/20/18 0935  Vitals shown include unvalidated device data.  Last Pain:  Vitals:   10/20/18 0702  TempSrc: Oral  PainSc: 0-No pain         Complications: No apparent anesthesia complications

## 2018-10-20 NOTE — Discharge Instructions (Signed)
[facility] _______[facility]  UMBILICAL  HERNIA REPAIR: POST OP INSTRUCTIONS  Always review your discharge instruction sheet given to you by the facility where your surgery was performed. IF YOU HAVE DISABILITY OR FAMILY LEAVE FORMS, YOU MUST BRING THEM TO THE OFFICE FOR PROCESSING.   DO NOT GIVE THEM TO YOUR DOCTOR.  1. A  prescription for pain medication may be given to you upon discharge.  Take your pain medication as prescribed, if needed.  If narcotic pain medicine is not needed, then you may take acetaminophen (Tylenol) or ibuprofen (Advil) as needed. 2. Take your usually prescribed medications unless otherwise directed. If you need a refill on your pain medication, please contact your pharmacy.  They will contact our office to request authorization. Prescriptions will not be filled after 5 pm or on week-ends. 3. You should follow a light diet the first 24 hours after arrival home, such as soup and crackers, etc.  Be sure to include lots of fluids daily.  Resume your normal diet the day after surgery.  No tylenol until after 1pm today.            4.Most patients will experience some swelling and bruising around the umbilicus .  Ice packs and reclining will help.  Swelling and bruising can take several days to resolve.  6. It is common to experience some constipation if taking pain medication after surgery.  Increasing fluid intake and taking a stool softener (such as Colace) will usually help or prevent this problem from occurring.  A mild laxative (Milk of Magnesia or Miralax) should be taken according to package directions if there are no bowel movements after 48 hours. 7. Unless discharge instructions indicate otherwise, you may remove your bandages 24-48 hours after surgery, and you may shower at that time.  You may have steri-strips (small skin tapes) in place directly over the incision.  These strips should be left on the skin for 7-10 days.  If your surgeon used  skin glue on the incision, you may shower in 24 hours.  The glue will flake off over the next 2-3 weeks.  Any sutures or staples will be removed at the office during your follow-up visit. 8. ACTIVITIES:  You may resume regular (light) daily activities beginning the next day--such as daily self-care, walking, climbing stairs--gradually increasing activities as tolerated.  You may have sexual intercourse when it is comfortable.  Refrain from any heavy lifting or straining until approved by your doctor.  a.You may drive when you are no longer taking prescription pain medication, you can comfortably wear a seatbelt, and you can safely maneuver your car and apply brakes. b.RETURN TO WORK:   _No sports, running, or lifting more than 15 pounds for 5 weeks.  ____________________________________________  9.You should see your doctor in the office for a follow-up appointment approximately 2-3 weeks after your surgery.  Make sure that you call for this appointment within a day or two after you arrive home to insure a convenient appointment time. 10.OTHER INSTRUCTIONS: _________________________    _____________________________________  WHEN TO CALL YOUR DOCTOR: 1. Fever over 101.0 2. Inability to urinate 3. Nausea and/or vomiting 4. Extreme swelling or bruising 5. Continued bleeding from incision. 6. Increased pain, redness, or drainage from the incision  The clinic staff is available to answer your questions during regular business hours.  Please dont hesitate to call and ask to speak to one of the nurses for clinical concerns.  If you have a medical emergency,  go to the nearest emergency room or call 911.  A surgeon from [facility] is always on call at the hospital   [address], [place], [place], [place]  [address] ?  [address], [place], [place]   [address] [phone] ? [phone] ? FAX [phone] Web site: [email or web address]   Post Anesthesia Home  Care Instructions  Activity: Get plenty of rest for the remainder of the day. A responsible individual must stay with you for 24 hours following the procedure.  For the next 24 hours, DO NOT: -Drive a car -Paediatric nurse -Drink alcoholic beverages -Take any medication unless instructed by your physician -Make any legal decisions or sign important papers.  Meals: Start with liquid foods such as gelatin or soup. Progress to regular foods as tolerated. Avoid greasy, spicy, heavy foods. If nausea and/or vomiting occur, drink only clear liquids until the nausea and/or vomiting subsides. Call your physician if vomiting continues.  Special Instructions/Symptoms: Your throat may feel dry or sore from the anesthesia or the breathing tube placed in your throat during surgery. If this causes discomfort, gargle with warm salt water. The discomfort should disappear within 24 hours.  If you had a scopolamine patch placed behind your ear for the management of post- operative nausea and/or vomiting:  1. The medication in the patch is effective for 72 hours, after which it should be removed.  Wrap patch in a tissue and discard in the trash. Wash hands thoroughly with soap and water. 2. You may remove the patch earlier than 72 hours if you experience unpleasant side effects which may include dry mouth, dizziness or visual disturbances. 3. Avoid touching the patch. Wash your hands with soap and water after contact with the patch.                 Managing Your Pain After Surgery Without Opioids    Thank you for participating in our program to help patients manage their pain after surgery without opioids. This is part of our effort to provide you with the best care possible, without exposing you or your family to the risk that opioids pose.  What pain can I expect after surgery? You can expect to have some pain after surgery. This is normal. The pain is typically worse the day after  surgery, and quickly begins to get better. Many studies have found that many patients are able to manage their pain after surgery with Over-the-Counter (OTC) medications such as Tylenol and Motrin. If you have a condition that does not allow you to take Tylenol or Motrin, notify your surgical team.  How will I manage my pain? The best strategy for controlling your pain after surgery is around the clock pain control with Tylenol (acetaminophen) and Motrin (ibuprofen or Advil). Alternating these medications with each other allows you to maximize your pain control. In addition to Tylenol and Motrin, you can use heating pads or ice packs on your incisions to help reduce your pain.  How will I alternate your regular strength over-the-counter pain medication? You will take a dose of pain medication every three hours. ; Start by taking 650 mg of Tylenol (2 pills of 325 mg) ; 3 hours later take 600 mg of Motrin (3 pills of 200 mg) ; 3 hours after taking the Motrin take 650 mg of Tylenol ; 3 hours after that take 600 mg of Motrin.   - 1 -  See example - if your first dose of Tylenol  is at 12:00 PM   12:00 PM Tylenol 650 mg (2 pills of 325 mg)  3:00 PM Motrin 600 mg (3 pills of 200 mg)  6:00 PM Tylenol 650 mg (2 pills of 325 mg)  9:00 PM Motrin 600 mg (3 pills of 200 mg)  Continue alternating every 3 hours   We recommend that you follow this schedule around-the-clock for at least 3 days after surgery, or until you feel that it is no longer needed. Use the table on the last page of this handout to keep track of the medications you are taking. Important: Do not take more than 3000mg  of Tylenol or 3200mg  of Motrin in a 24-hour period. Do not take ibuprofen/Motrin if you have a history of bleeding stomach ulcers, severe kidney disease, &/or actively taking a blood thinner  What if I still have pain? If you have pain that is not controlled with the over-the-counter pain medications (Tylenol and  Motrin or Advil) you might have what we call breakthrough pain. You will receive a prescription for a small amount of an opioid pain medication such as Oxycodone, Tramadol, or Tylenol with Codeine. Use these opioid pills in the first 24 hours after surgery if you have breakthrough pain. Do not take more than 1 pill every 4-6 hours.  If you still have uncontrolled pain after using all opioid pills, don't hesitate to call our staff using the number provided. We will help make sure you are managing your pain in the best way possible, and if necessary, we can provide a prescription for additional pain medication.   Day 1    Time  Name of Medication Number of pills taken  Amount of Acetaminophen  Pain Level   Comments  AM PM       AM PM       AM PM       AM PM       AM PM       AM PM       AM PM       AM PM       Total Daily amount of Acetaminophen Do not take more than  3,000 mg per day      Day 2    Time  Name of Medication Number of pills taken  Amount of Acetaminophen  Pain Level   Comments  AM PM       AM PM       AM PM       AM PM       AM PM       AM PM       AM PM       AM PM       Total Daily amount of Acetaminophen Do not take more than  3,000 mg per day      Day 3    Time  Name of Medication Number of pills taken  Amount of Acetaminophen  Pain Level   Comments  AM PM       AM PM       AM PM       AM PM          AM PM       AM PM       AM PM       AM PM       Total Daily amount of Acetaminophen Do not take more than  3,000 mg per day  Day 4    Time  Name of Medication Number of pills taken  Amount of Acetaminophen  Pain Level   Comments  AM PM       AM PM       AM PM       AM PM       AM PM       AM PM       AM PM       AM PM       Total Daily amount of Acetaminophen Do not take more than  3,000 mg per day      Day 5    Time  Name of Medication Number of pills taken  Amount of Acetaminophen  Pain Level    Comments  AM PM       AM PM       AM PM       AM PM       AM PM       AM PM       AM PM       AM PM       Total Daily amount of Acetaminophen Do not take more than  3,000 mg per day       Day 6    Time  Name of Medication Number of pills taken  Amount of Acetaminophen  Pain Level  Comments  AM PM       AM PM       AM PM       AM PM       AM PM       AM PM       AM PM       AM PM       Total Daily amount of Acetaminophen Do not take more than  3,000 mg per day      Day 7    Time  Name of Medication Number of pills taken  Amount of Acetaminophen  Pain Level   Comments  AM PM       AM PM       AM PM       AM PM       AM PM       AM PM       AM PM       AM PM       Total Daily amount of Acetaminophen Do not take more than  3,000 mg per day        For additional information about how and where to safely dispose of unused opioid medications - [employer]  Disclaimer: This document contains information and/or instructional materials adapted from [facility] for the typical patient with your condition. It does not replace medical advice from your health care provider because your experience may differ from that of the typical patient. Talk to your health care provider if you have any questions about this document, your condition or your treatment plan. Adapted from [place]

## 2018-10-20 NOTE — Interval H&P Note (Signed)
History and Physical Interval Note:  10/20/2018 7:27 AM  Tim Hardy  has presented today for surgery, with the diagnosis of UMBILICAL HERNIA.  The various methods of treatment have been discussed with the patient and family. After consideration of risks, benefits and other options for treatment, the patient has consented to  Procedure(s): OPEN REPAIR UMBILICAL HERNIA WITH  MESH (N/A) as a surgical intervention.  The patient's history has been reviewed, patient examined, no change in status, stable for surgery.  I have reviewed the patient's chart and labs.  Questions were answered to the patient's satisfaction.     Adin Hector

## 2018-10-20 NOTE — Anesthesia Procedure Notes (Signed)
Procedure Name: Intubation Performed by: Beau Vanduzer H, CRNA Pre-anesthesia Checklist: Patient identified, Emergency Drugs available, Suction available and Patient being monitored Patient Re-evaluated:Patient Re-evaluated prior to induction Oxygen Delivery Method: Circle System Utilized Preoxygenation: Pre-oxygenation with 100% oxygen Induction Type: IV induction Ventilation: Mask ventilation without difficulty and Oral airway inserted - appropriate to patient size Laryngoscope Size: Mac and 4 Grade View: Grade I Tube type: Oral Tube size: 8.0 mm Number of attempts: 1 Airway Equipment and Method: Stylet and Oral airway Placement Confirmation: ETT inserted through vocal cords under direct vision,  positive ETCO2 and breath sounds checked- equal and bilateral Secured at: 24 cm Tube secured with: Tape Dental Injury: Teeth and Oropharynx as per pre-operative assessment        

## 2018-10-20 NOTE — Op Note (Signed)
Patient Name:           Tim [facility]   Date of Surgery:        10/20/2018  Pre op Diagnosis:      Incarcerated umbilical hernia  Post op Diagnosis:    Same  Procedure:                 Open repair incarcerated umbilical hernia                                      Implantation 8.0 cm diameter ventral Lex ST composite mesh patch                                      Underlay repair  Surgeon:                     Edsel Petrin. Dalbert Batman, M.D., FACS  Assistant:                      Or staff  Operative Indications:   This is a very nice 78 year old man, referred by Dr. Lavone Orn for evaluation of enlarging umbilical hernia. Dr. Marlou Porch is his cardiologist. Dr. 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      He has a history of 5 surgeries for inguinal hernia 3 on one side and 2 on the other. They have remained repaired and he has no symptoms down there. He says he's had a bulge at his umbilicus for 4 years. It is getting larger. He sometimes has to push it back in. Occasionally a little pain. No incarceration.      Past history reveals aortic valve replacement in 2013. Takes aspirin only. Chronic asthma. GERD. BPH. Hyperlipidemia. 5 inguinal hernia surgeries. Knee and shoulder surgery      Exam reveals a large umbilical hernia with an 8 cm sac and a 3 cm defect. Reducible. He agrees this time to have it repaired. He will be scheduled for open repair of umbilical hernia with inlay mesh. I discussed the indications, details, techniques, and numerous risk of the surgery with him. He is aware the risks of bleeding, infection, recurrence of the hernia, chronic pain from nerve damage. Rarely injury to the intestine with major reconstructive surgery. Cardiac pulmonary thromboembolic problems. He understands all these issues. All of his questions are answered. He agrees with this plan   Operative Findings:       He had an incarcerated umbilical hernia.  There was a little bit  of omentum which required a small amount of dissection to release.  He had a large hernia sac which I completely debrided.  The 8 cm mesh patch was placed under the fascia with the adhesion barrier against the omentum.  The defect was then closed on top of the mesh.  Procedure in Detail:          Following the induction of general endotracheal anesthesia the patient's abdomen was prepped and draped in a sterile fashion.  Surgical timeout was performed.  Intravenous antibiotics were given.  0.5% Marcaine with epinephrine was used as a local infiltration anesthetic.  A curvilinear transverse incision was made below the lower rim of the umbilicus.  Subcutaneous tissue was dissected away from the umbilical hernia sac.  The umbilicus itself was dissected cephalad.  We entered the hernia sac and did a small amount of dissection to release the omentum.  There was no bleeding.  Large hernia sac was completely debrided.  We undermined the subcutaneous tissue off of the fascia circumferentially for a small distance.  We felt inside and there were no other defects.    8 cm ventrallex ST mesh patch was brought to the operative field.  Suture fixation was #1 Novafil.  4 equidistant mattress sutures of #1 Novafil were placed down through the fascia, through the sewing ring of the mesh and then back up through the fascia.  After all 4 were carefully placed the seem well positioned.  Care was taken to orient the mesh so that the adhesion barrier was down toward the viscera and the rough side of the mesh was up toward the fascia.  The mesh was folded and inserted into the intraperitoneal space.  All of the sutures were lifted up.  The mesh deployed nicely and was smoothed out in all directions.  All 4 sutures were placed.  The wound was irrigated.  The fascia was closed transversely with 5 or 6 interrupted sutures of #1 Novafil.  The umbilicus was tacked down to the fascia with a 3-0 Vicryl suture.  The subcutaneous tissue was  closed with 3-0 Vicryl sutures and the skin closed with running subcuticular 4-0 Monocryl and Dermabond.  The patient tolerated the procedure well and was taken to PACU in stable condition.  EBL 15 cc.  Counts correct.  Complications none.   Addendum: I logged onto the PMP aware website and reviewed his prescription medication history.     Edsel Petrin. Dalbert Batman, M.D., FACS General and Minimally Invasive Surgery Breast and Colorectal Surgery  10/20/2018 9:26 AM

## 2018-10-21 ENCOUNTER — Encounter (HOSPITAL_BASED_OUTPATIENT_CLINIC_OR_DEPARTMENT_OTHER): Payer: Self-pay | Admitting: General Surgery

## 2018-11-04 ENCOUNTER — Ambulatory Visit (INDEPENDENT_AMBULATORY_CARE_PROVIDER_SITE_OTHER): Payer: Medicare Other | Admitting: Cardiology

## 2018-11-04 ENCOUNTER — Encounter: Payer: Self-pay | Admitting: Cardiology

## 2018-11-04 ENCOUNTER — Other Ambulatory Visit: Payer: Self-pay

## 2018-11-04 VITALS — BP 120/78 | HR 79 | Ht 73.0 in | Wt 235.0 lb

## 2018-11-04 DIAGNOSIS — C61 Malignant neoplasm of prostate: Secondary | ICD-10-CM

## 2018-11-04 DIAGNOSIS — E7800 Pure hypercholesterolemia, unspecified: Secondary | ICD-10-CM

## 2018-11-04 DIAGNOSIS — Z952 Presence of prosthetic heart valve: Secondary | ICD-10-CM

## 2018-11-04 DIAGNOSIS — I359 Nonrheumatic aortic valve disorder, unspecified: Secondary | ICD-10-CM

## 2018-11-04 DIAGNOSIS — Z79899 Other long term (current) drug therapy: Secondary | ICD-10-CM

## 2018-11-04 NOTE — Patient Instructions (Signed)
Medication Instructions:  The current medical regimen is effective;  continue present plan and medications.  If you need a refill on your cardiac medications before your next appointment, please call your pharmacy.   Lab work: Please have a lipid panel today. If you have labs (blood work) drawn today and your tests are completely normal, you will receive your results only by: Marland Kitchen MyChart Message (if you have MyChart) OR . A paper copy in the mail If you have any lab test that is abnormal or we need to change your treatment, we will call you to review the results.  Follow-Up: At [facility], you and your health needs are our priority.  As part of our continuing mission to provide you with exceptional heart care, we have created designated Provider Care Teams.  These Care Teams include your primary Cardiologist (physician) and Advanced Practice Providers (APPs -  Physician Assistants and Nurse Practitioners) who all work together to provide you with the care you need, when you need it. You will need a follow up appointment in 6 months.  Please call our office 2 months in advance to schedule this appointment.  You may see Candee Furbish, MD or one of the following Advanced Practice Providers on your designated Care Team:   Truitt Merle, NP Cecilie Kicks, NP . Kathyrn Drown, NP  Thank you for choosing [facility]!!

## 2018-11-04 NOTE — Progress Notes (Signed)
Cardiology Office Note    Date:  11/04/2018   ID:  Tim Hardy, Tim Hardy 1940/04/25, MRN [id number]  PCP:  Lavone Orn, MD  Cardiologist:   Candee Furbish, MD     History of Present Illness:  Tim Hardy is a 78 y.o. male with aortic valve replacement in September 2013 here for follow-up. Only had nonobstructive coronary artery disease on cardiac catheterization, 30-40% LAD. I took care of his mother as well.  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  No issues related to his aortic valve. He takes his dental antibiotics.  03/31/17-overall he has been doing quite well.  No chest pain fevers chills nausea vomiting syncope bleeding.  I am not had any prolonged fevers.  Aortic valve seems to be doing well.  Taking his medications.  09/28/2017- been feeling some increased shortness of breath, difficulty breathing over the summer.  Some fleeting chest discomfort as well. Left sided sharp pain, 5 min.   11/04/2018-here for valve replacement follow-up.  He has been doing quite well.  He did have an incarcerated hernia repair in 10/2018. Dr. Dalbert Batman. No pain.  Overall he is been doing quite well no fevers chills nausea vomiting syncope bleeding.  Past Medical History:  Diagnosis Date  . Aortic valve stenosis, moderate    LVH  . Asthma   . Basal cell adenocarcinoma   . BPH (benign prostatic hyperplasia)   . Bronchitis   . Cancer [facility])    skin cancer removed in August  . Colon polyps   . Colon, diverticulosis   . Diastolic dysfunction   . ED (erectile dysfunction)   . GERD (gastroesophageal reflux disease)   . Heart murmur   . Hiatal hernia   . Incarcerated umbilical hernia [id number]  . Perennial allergic rhinitis   . Shortness of breath     Past Surgical History:  Procedure Laterality Date  . AORTIC VALVE REPLACEMENT  11/03/2011   Procedure: AORTIC VALVE REPLACEMENT (AVR);  Surgeon: Melrose Nakayama, MD;  Location: [place];  Service: Open Heart  Surgery;  Laterality: N/A;  Partial Sternotomy  . APPENDECTOMY    . CARDIAC CATHETERIZATION    . COLONOSCOPY W/ POLYPECTOMY    . COLONOSCOPY WITH PROPOFOL N/A 07/11/2014   Procedure: COLONOSCOPY WITH PROPOFOL;  Surgeon: Garlan Fair, MD;  [facility];  Service: Endoscopy;  Laterality: N/A;  . ESOPHAGOGASTRODUODENOSCOPY (EGD) WITH PROPOFOL N/A 07/11/2014   Procedure: ESOPHAGOGASTRODUODENOSCOPY (EGD) WITH PROPOFOL;  Surgeon: Garlan Fair, MD;  [facility];  Service: Endoscopy;  Laterality: N/A;  . FEMORAL HERNIA REPAIR     , x5  . HERNIA REPAIR Bilateral   . KNEE ARTHROSCOPY Bilateral   . SHOULDER ARTHROSCOPY Right   . STERNOTOMY  11/03/2011   Procedure: STERNOTOMY;  Surgeon: Melrose Nakayama, MD;  Location: [place];  Service: Open Heart Surgery;  Laterality: N/A;  Partial  . TONSILLECTOMY    . UMBILICAL HERNIA REPAIR N/A 10/20/2018   Procedure: OPEN REPAIR UMBILICAL HERNIA WITH  MESH;  Surgeon: Fanny Skates, MD;  Location: [place];  Service: General;  Laterality: N/A;    Current Medications: Outpatient Medications Prior to Visit  Medication Sig Dispense Refill  . aspirin EC 81 MG tablet Take 81 mg by mouth every morning.     Marland Kitchen atorvastatin (LIPITOR) 10 MG tablet Take 5 mg by mouth daily.    . cetirizine (ZYRTEC) 10 MG tablet Take 10 mg by mouth every  morning.     . Cholecalciferol (VITAMIN D PO) Take 1 tablet by mouth every morning.    . Cyanocobalamin (B-12 PO) Take 1 tablet by mouth every morning.     . fluticasone (FLONASE) 50 MCG/ACT nasal spray Place 2 sprays into the nose daily as needed for allergies.     . fluticasone (FLOVENT DISKUS) 50 MCG/BLIST diskus inhaler Inhale 2 puffs into the lungs 2 (two) times daily.    Marland Kitchen HYDROcodone-acetaminophen (NORCO) 5-325 MG tablet Take 1-2 tablets by mouth every 6 (six) hours as needed for moderate pain or severe pain. 20 tablet 0  . HYDROcodone-homatropine (HYCODAN) 5-1.5 MG/5ML syrup Take 5 mLs  by mouth every 6 (six) hours as needed for cough.    . Multiple Vitamin (MULTIVITAMIN WITH MINERALS) TABS Take 1 tablet by mouth every morning.     . naproxen sodium (ANAPROX) 220 MG tablet Take 220 mg by mouth 2 (two) times daily as needed (pain).    Marland Kitchen omeprazole (PRILOSEC) 10 MG capsule Take 1 capsule by mouth daily.    . Tamsulosin HCl (FLOMAX) 0.4 MG CAPS Take 0.4 mg by mouth daily after supper.      No facility-administered medications prior to visit.      Allergies:   Penicillins   Social History   Socioeconomic History  . Marital status: Widowed    Spouse name: Not on file  . Number of children: Not on file  . Years of education: Not on file  . Highest education level: Not on file  Occupational History  . Occupation: retired  Scientific laboratory technician  . Financial resource strain: Not on file  . Food insecurity    Worry: Not on file    Inability: Not on file  . Transportation needs    Medical: Not on file    Non-medical: Not on file  Tobacco Use  . Smoking status: Former Smoker    Types: Cigarettes    Start date: 02/04/1971    Quit date: 02/03/1972    Years since quitting: 46.7  . Smokeless tobacco: Current User    Types: Chew  Substance and Sexual Activity  . Alcohol use: Yes    Comment: rare  . Drug use: No  . Sexual activity: Not on file  Lifestyle  . Physical activity    Days per week: Not on file    Minutes per session: Not on file  . Stress: Not on file  Relationships  . Social Herbalist on phone: Not on file    Gets together: Not on file    Attends religious service: Not on file    Active member of club or organization: Not on file    Attends meetings of clubs or organizations: Not on file    Relationship status: Not on file  Other Topics Concern  . Not on file  Social History Narrative   Retired Chief Financial Officer at [employer]    He is a Physicist, medical and enjoys renovation log cabins       Family History:  The patient's family history includes Cancer  in his father; Diabetes in his sister and son; Heart attack in his mother; Hypertension in his father.   ROS:   Please see the history of present illness.    Review of Systems  All other systems reviewed and are negative.     PHYSICAL EXAM:   VS:  BP 120/78   Pulse 79   Ht 6\' 1"  (1.854 m)  Wt 235 lb (106.6 kg)   SpO2 97%   BMI 31.00 kg/m    GEN: Well nourished, well developed, in no acute distress  HEENT: normal  Neck: no JVD, carotid bruits, or masses Cardiac: RRR; 1/6 SM, no rubs, or gallops,no edema  Respiratory:  clear to auscultation bilaterally, normal work of breathing GI: soft, nontender, nondistended, + BS MS: no deformity or atrophy  Skin: warm and dry, no rash Neuro:  Alert and Oriented x 3, Strength and sensation are intact Psych: euthymic mood, full affect      Wt Readings from Last 3 Encounters:  11/04/18 235 lb (106.6 kg)  10/20/18 232 lb 9.4 oz (105.5 kg)  06/08/18 225 lb (102.1 kg)      Studies/Labs Reviewed:   EKG:  10/02/16-sinus rhythm NSR 65 with no other abnormalities other than nonspecific T-wave change personally viewed.  Recent Labs: 10/14/2018: ALT 24; BUN 13; Creatinine, Ser 1.15; Hemoglobin 14.1; Platelets 245; Potassium 3.9; Sodium 138   Lipid Panel    Component Value Date/Time   CHOL 106 09/28/2017 1444   TRIG 137 09/28/2017 1444   HDL 33 (L) 09/28/2017 1444   CHOLHDL 3.2 09/28/2017 1444   CHOLHDL 3.9 10/15/2015 0811   VLDL 28 10/15/2015 0811   LDLCALC 46 09/28/2017 1444    Additional studies/ records that were reviewed today include:   ECHO 04/17/14  - Left ventricle: The cavity size was normal. Wall thickness was increased in a pattern of mild LVH. There was mild focal basal hypertrophy of the septum. Systolic function was normal. The estimated ejection fraction was in the range of 60% to 65%. Wall motion was normal; there were no regional wall motion abnormalities. Doppler parameters are consistent with  abnormal left ventricular relaxation (grade 1 diastolic dysfunction). - Aortic valve: A bioprosthesis was present. There was trivial regurgitation. Mean gradient (S): 17 mm Hg. Peak gradient (S): 29 mm Hg. - Ascending aorta: The ascending aorta was mildly dilated. - Mitral valve: Calcified annulus. There was mild regurgitation. - Left atrium: The atrium was mildly dilated. - Right atrium: The atrium was mildly dilated.  ECHO 09/28/17: - Left ventricle: The cavity size was mildly dilated. Systolic   function was vigorous. The estimated ejection fraction was in the   range of 65% to 70%. Wall motion was normal; there were no   regional wall motion abnormalities. Doppler parameters are   consistent with abnormal left ventricular relaxation (grade 1   diastolic dysfunction). There was no evidence of elevated   ventricular filling pressure by Doppler parameters. - Aortic valve: Poorly visualized. There was mild stenosis. There   was trivial regurgitation. Mean gradient (S): 16 mm Hg. Peak   gradient (S): 26 mm Hg. - Mitral valve: Calcified annulus. Mildly thickened leaflets . - Left atrium: The atrium was moderately dilated. - Right ventricle: The cavity size was normal. Wall thickness was   normal. Systolic function was normal. - Right atrium: The atrium was moderately dilated. - Tricuspid valve: There was mild regurgitation. - Pulmonary arteries: Systolic pressure was mildly to moderately   increased. PA peak pressure: 40 mm Hg (S). - Inferior vena cava: The vessel was normal in size. - Pericardium, extracardiac: There was no pericardial effusion.   ASSESSMENT:    1. Hypercholesteremia   2. Aortic valve disorder   3. H/O aortic valve replacement   4. Prostate CA ([place])   5. Long-term use of high-risk medication      PLAN:  In order of problems  listed above:   History of aortic valve replacement  - Echocardiogram reassuring in 2016. Very thankful. Dental prophylaxis.   - Overall doing well. No change in murmur.  -Echocardiogram 04/2017 is reassuring.  No need for repeat at this point.  Stress test in 2019 was also excellent.  Hyperlipidemia  - Low-dose atorvastatin 10 mg. Mildly elevated bilirubin in the past.  We will check a lipid panel.  Previous LDL excellent.  No myalgias.  Prostate cancer  - Previous Gleason score is 6. Dr. Rosana Hoes has been monitoring.PSA has been unchanged. Good news.  Stable.  Obesity  - Continue to encourage weight loss.  -Low-carb diet.  No changes made.    Medication Adjustments/Labs and Tests Ordered: Current medicines are reviewed at length with the patient today.  Concerns regarding medicines are outlined above.  Medication changes, Labs and Tests ordered today are listed in the Patient Instructions below. Patient Instructions  Medication Instructions:  The current medical regimen is effective;  continue present plan and medications.  If you need a refill on your cardiac medications before your next appointment, please call your pharmacy.   Lab work: Please have a lipid panel today. If you have labs (blood work) drawn today and your tests are completely normal, you will receive your results only by: Marland Kitchen MyChart Message (if you have MyChart) OR . A paper copy in the mail If you have any lab test that is abnormal or we need to change your treatment, we will call you to review the results.  Follow-Up: At [facility], you and your health needs are our priority.  As part of our continuing mission to provide you with exceptional heart care, we have created designated Provider Care Teams.  These Care Teams include your primary Cardiologist (physician) and Advanced Practice Providers (APPs -  Physician Assistants and Nurse Practitioners) who all work together to provide you with the care you need, when you need it. You will need a follow up appointment in 6 months.  Please call our office 2 months in advance to schedule this  appointment.  You may see Candee Furbish, MD or one of the following Advanced Practice Providers on your designated Care Team:   Truitt Merle, NP Cecilie Kicks, NP . Kathyrn Drown, NP  Thank you for choosing [facility]!!         Signed, Candee Furbish, MD  11/04/2018 3:42 PM    [address], [place], [place]  [address] Phone: [phone]; Fax: [phone]

## 2018-11-05 ENCOUNTER — Encounter: Payer: Self-pay | Admitting: *Deleted

## 2018-11-05 LAB — LIPID PANEL
Chol/HDL Ratio: 3.9 ratio (ref 0.0–5.0)
Cholesterol, Total: 116 mg/dL (ref 100–199)
HDL: 30 mg/dL — ABNORMAL LOW (ref >39)
LDL Chol Calc (NIH): 44 mg/dL (ref 0–99)
Triglycerides: 269 mg/dL — ABNORMAL HIGH (ref 0–149)
VLDL Cholesterol Cal: 42 mg/dL — ABNORMAL HIGH (ref 5–40)

## 2019-05-10 ENCOUNTER — Ambulatory Visit: Payer: Medicare Other | Admitting: Cardiology

## 2019-05-10 ENCOUNTER — Other Ambulatory Visit: Payer: Self-pay

## 2019-05-10 ENCOUNTER — Encounter: Payer: Self-pay | Admitting: Cardiology

## 2019-05-10 VITALS — BP 130/80 | HR 92 | Ht 73.0 in | Wt 235.8 lb

## 2019-05-10 DIAGNOSIS — C61 Malignant neoplasm of prostate: Secondary | ICD-10-CM | POA: Diagnosis not present

## 2019-05-10 DIAGNOSIS — E7800 Pure hypercholesterolemia, unspecified: Secondary | ICD-10-CM

## 2019-05-10 DIAGNOSIS — I359 Nonrheumatic aortic valve disorder, unspecified: Secondary | ICD-10-CM | POA: Diagnosis not present

## 2019-05-10 DIAGNOSIS — Z952 Presence of prosthetic heart valve: Secondary | ICD-10-CM | POA: Diagnosis not present

## 2019-05-10 NOTE — Progress Notes (Signed)
Cardiology Office Note:    Date:  05/10/2019   ID:  Tim Hardy, DOB 04-Dec-1940, MRN [id number]  PCP:  Lavone Orn, MD  Cardiologist:  Candee Furbish, MD  Electrophysiologist:  None   Referring MD: Lavone Orn, MD     History of Present Illness:    Tim Hardy is a 79 y.o. male here for the follow-up of aortic valve replacement back in September 2013 with nonobstructive CAD on catheterization with 30 to 40% LAD. Placed to take care of his mother as well.  Enjoys hobby of renovating all cabins have several of them on his property.  Now up to 17.  Had hernia repair by Dr. Dalbert Batman in 2020.   Overall feels well no chest pain no fevers no chills no nausea no vomiting  Past Medical History:  Diagnosis Date  . Aortic valve stenosis, moderate    LVH  . Asthma   . Basal cell adenocarcinoma   . BPH (benign prostatic hyperplasia)   . Bronchitis   . Cancer [facility])    skin cancer removed in August  . Colon polyps   . Colon, diverticulosis   . Diastolic dysfunction   . ED (erectile dysfunction)   . GERD (gastroesophageal reflux disease)   . Heart murmur   . Hiatal hernia   . Incarcerated umbilical hernia [id number]  . Perennial allergic rhinitis   . Shortness of breath     Past Surgical History:  Procedure Laterality Date  . AORTIC VALVE REPLACEMENT  11/03/2011   Procedure: AORTIC VALVE REPLACEMENT (AVR);  Surgeon: Melrose Nakayama, MD;  Location: [place];  Service: Open Heart Surgery;  Laterality: N/A;  Partial Sternotomy  . APPENDECTOMY    . CARDIAC CATHETERIZATION    . COLONOSCOPY W/ POLYPECTOMY    . COLONOSCOPY WITH PROPOFOL N/A 07/11/2014   Procedure: COLONOSCOPY WITH PROPOFOL;  Surgeon: Garlan Fair, MD;  [facility];  Service: Endoscopy;  Laterality: N/A;  . ESOPHAGOGASTRODUODENOSCOPY (EGD) WITH PROPOFOL N/A 07/11/2014   Procedure: ESOPHAGOGASTRODUODENOSCOPY (EGD) WITH PROPOFOL;  Surgeon: Garlan Fair, MD;  [facility];  Service:  Endoscopy;  Laterality: N/A;  . FEMORAL HERNIA REPAIR     , x5  . HERNIA REPAIR Bilateral   . KNEE ARTHROSCOPY Bilateral   . SHOULDER ARTHROSCOPY Right   . STERNOTOMY  11/03/2011   Procedure: STERNOTOMY;  Surgeon: Melrose Nakayama, MD;  Location: [place];  Service: Open Heart Surgery;  Laterality: N/A;  Partial  . TONSILLECTOMY    . UMBILICAL HERNIA REPAIR N/A 10/20/2018   Procedure: OPEN REPAIR UMBILICAL HERNIA WITH  MESH;  Surgeon: Fanny Skates, MD;  Location: [place];  Service: General;  Laterality: N/A;    Current Medications: Current Meds  Medication Sig  . aspirin EC 81 MG tablet Take 81 mg by mouth every morning.   Marland Kitchen atorvastatin (LIPITOR) 10 MG tablet Take 5 mg by mouth daily.  . cetirizine (ZYRTEC) 10 MG tablet Take 10 mg by mouth every morning.   . Cholecalciferol (VITAMIN D PO) Take 1 tablet by mouth every morning.  . Cyanocobalamin (B-12 PO) Take 1 tablet by mouth every morning.   . fluticasone (FLONASE) 50 MCG/ACT nasal spray Place 2 sprays into the nose daily as needed for allergies.   . fluticasone (FLOVENT DISKUS) 50 MCG/BLIST diskus inhaler Inhale 2 puffs into the lungs 2 (two) times daily.  Marland Kitchen HYDROcodone-acetaminophen (NORCO) 5-325 MG tablet Take 1-2 tablets by mouth every 6 (six) hours as needed  for moderate pain or severe pain.  Marland Kitchen HYDROcodone-homatropine (HYCODAN) 5-1.5 MG/5ML syrup Take 5 mLs by mouth every 6 (six) hours as needed for cough.  . Multiple Vitamin (MULTIVITAMIN WITH MINERALS) TABS Take 1 tablet by mouth every morning.   . naproxen sodium (ANAPROX) 220 MG tablet Take 220 mg by mouth 2 (two) times daily as needed (pain).  Marland Kitchen omeprazole (PRILOSEC) 10 MG capsule Take 1 capsule by mouth daily.  . Tamsulosin HCl (FLOMAX) 0.4 MG CAPS Take 0.4 mg by mouth daily after supper.      Allergies:   Penicillins   Social History   Socioeconomic History  . Marital status: Widowed    Spouse name: Not on file  . Number of children: Not on file   . Years of education: Not on file  . Highest education level: Not on file  Occupational History  . Occupation: retired  Tobacco Use  . Smoking status: Former Smoker    Types: Cigarettes    Start date: 02/04/1971    Quit date: 02/03/1972    Years since quitting: 47.2  . Smokeless tobacco: Current User    Types: Chew  Substance and Sexual Activity  . Alcohol use: Yes    Comment: rare  . Drug use: No  . Sexual activity: Not on file  Other Topics Concern  . Not on file  Social History Narrative   Retired Chief Financial Officer at [employer]    He is a Physicist, medical and enjoys renovation log cabins     Social Determinants of Radio broadcast assistant Strain:   . Difficulty of Paying Living Expenses:   Food Insecurity:   . Worried About Charity fundraiser in the Last Year:   . Arboriculturist in the Last Year:   Transportation Needs:   . Film/video editor (Medical):   Marland Kitchen Lack of Transportation (Non-Medical):   Physical Activity:   . Days of Exercise per Week:   . Minutes of Exercise per Session:   Stress:   . Feeling of Stress :   Social Connections:   . Frequency of Communication with Friends and Family:   . Frequency of Social Gatherings with Friends and Family:   . Attends Religious Services:   . Active Member of Clubs or Organizations:   . Attends Archivist Meetings:   Marland Kitchen Marital Status:      Family History: The patient's family history includes Cancer in his father; Diabetes in his sister and son; Heart attack in his mother; Hypertension in his father.  ROS:   Please see the history of present illness.     All other systems reviewed and are negative.  EKGs/Labs/Other Studies Reviewed:    The following studies were reviewed today:  ECHO 09/28/17: - Left ventricle: The cavity size was mildly dilated. Systolic function was vigorous. The estimated ejection fraction was in the range of 65% to 70%. Wall motion was normal; there were no regional wall  motion abnormalities. Doppler parameters are consistent with abnormal left ventricular relaxation (grade 1 diastolic dysfunction). There was no evidence of elevated ventricular filling pressure by Doppler parameters. - Aortic valve: Poorly visualized. Aortic valve replaced there was mild stenosis. There was trivial regurgitation. Mean gradient (S): 16 mm Hg. Peak gradient (S): 26 mm Hg. - Mitral valve: Calcified annulus. Mildly thickened leaflets . - Left atrium: The atrium was moderately dilated. - Right ventricle: The cavity size was normal. Wall thickness was normal. Systolic function was normal. -  Right atrium: The atrium was moderately dilated. - Tricuspid valve: There was mild regurgitation. - Pulmonary arteries: Systolic pressure was mildly to moderately increased. PA peak pressure: 40 mm Hg (S). - Inferior vena cava: The vessel was normal in size. - Pericardium, extracardiac: There was no pericardial effusion  EKG:  EKG is not ordered today.    Recent Labs: 10/14/2018: ALT 24; BUN 13; Creatinine, Ser 1.15; Hemoglobin 14.1; Platelets 245; Potassium 3.9; Sodium 138  Recent Lipid Panel    Component Value Date/Time   CHOL 116 11/04/2018 1512   TRIG 269 (H) 11/04/2018 1512   HDL 30 (L) 11/04/2018 1512   CHOLHDL 3.9 11/04/2018 1512   CHOLHDL 3.9 10/15/2015 0811   VLDL 28 10/15/2015 0811   LDLCALC 44 11/04/2018 1512    Physical Exam:    VS:  BP 130/80   Pulse 92   Ht 6\' 1"  (1.854 m)   Wt 235 lb 12.8 oz (107 kg)   SpO2 92%   BMI 31.11 kg/m     Wt Readings from Last 3 Encounters:  05/10/19 235 lb 12.8 oz (107 kg)  11/04/18 235 lb (106.6 kg)  10/20/18 232 lb 9.4 oz (105.5 kg)     GEN:  Well nourished, well developed in no acute distress HEENT: Normal NECK: No JVD; No carotid bruits LYMPHATICS: No lymphadenopathy CARDIAC: RRR, soft S murmurs, rubs, gallops RESPIRATORY:  Clear to auscultation without rales, wheezing or rhonchi  ABDOMEN: Soft,  non-tender, non-distended MUSCULOSKELETAL:  No edema; No deformity  SKIN: Warm and dry NEUROLOGIC:  Alert and oriented x 3 PSYCHIATRIC:  Normal affect   ASSESSMENT:    1. Hypercholesteremia   2. H/O aortic valve replacement   3. Aortic valve disorder   4. Prostate CA [facility])    PLAN:    In order of problems listed above:  Aortic valve replacement -Overall doing well without any difficulties. Mean gradient remains 15 mmHg. We will go ahead and recheck echocardiogram since it has been 2 years. Stress test in 2019 was also reassuring.  Hyperlipidemia -Atorvastatin 5 mg. Had had episodes of mild elevation in bilirubin in the past. LDL previously 31, ALT 24 creatinine 1.1 hemoglobin 14.1.  We will recheck lipid panel at next visit in 6 months.  Prostate cancer -Gleason score 6. Dr. Rosana Hoes. Stable.  PSA remains elevated but stable.  Encouraged him to go ahead and get Covid vaccine.   Medication Adjustments/Labs and Tests Ordered: Current medicines are reviewed at length with the patient today.  Concerns regarding medicines are outlined above.  Orders Placed This Encounter  Procedures  . Lipid panel  . ECHOCARDIOGRAM COMPLETE   No orders of the defined types were placed in this encounter.   Patient Instructions  Medication Instructions:  The current medical regimen is effective;  continue present plan and medications.  *If you need a refill on your cardiac medications before your next appointment, please call your pharmacy*  Lab Work: Please have lipid panel in 6 months. If you have labs (blood work) drawn today and your tests are completely normal, you will receive your results only by: Marland Kitchen MyChart Message (if you have MyChart) OR . A paper copy in the mail If you have any lab test that is abnormal or we need to change your treatment, we will call you to review the results.  Testing/Procedures: Your physician has requested that you have an echocardiogram. Echocardiography is  a painless test that uses sound waves to create images of your heart. It  provides your doctor with information about the size and shape of your heart and how well your heart's chambers and valves are working. This procedure takes approximately one hour. There are no restrictions for this procedure.  Follow-Up: At [facility], you and your health needs are our priority.  As part of our continuing mission to provide you with exceptional heart care, we have created designated Provider Care Teams.  These Care Teams include your primary Cardiologist (physician) and Advanced Practice Providers (APPs -  Physician Assistants and Nurse Practitioners) who all work together to provide you with the care you need, when you need it.  We recommend signing up for the patient portal called "MyChart".  Sign up information is provided on this After Visit Summary.  MyChart is used to connect with patients for Virtual Visits (Telemedicine).  Patients are able to view lab/test results, encounter notes, upcoming appointments, etc.  Non-urgent messages can be sent to your provider as well.   To learn more about what you can do with MyChart, go to [email or web address].    Your next appointment:   6 month(s)  The format for your next appointment:   In Person  Provider:   Candee Furbish, MD  Thank you for choosing [facility]!!        Signed, Candee Furbish, MD  05/10/2019 3:21 PM    [place]

## 2019-05-10 NOTE — Patient Instructions (Signed)
Medication Instructions:  The current medical regimen is effective;  continue present plan and medications.  *If you need a refill on your cardiac medications before your next appointment, please call your pharmacy*  Lab Work: Please have lipid panel in 6 months. If you have labs (blood work) drawn today and your tests are completely normal, you will receive your results only by: Marland Kitchen MyChart Message (if you have MyChart) OR . A paper copy in the mail If you have any lab test that is abnormal or we need to change your treatment, we will call you to review the results.  Testing/Procedures: Your physician has requested that you have an echocardiogram. Echocardiography is a painless test that uses sound waves to create images of your heart. It provides your doctor with information about the size and shape of your heart and how well your heart's chambers and valves are working. This procedure takes approximately one hour. There are no restrictions for this procedure.  Follow-Up: At [facility], you and your health needs are our priority.  As part of our continuing mission to provide you with exceptional heart care, we have created designated Provider Care Teams.  These Care Teams include your primary Cardiologist (physician) and Advanced Practice Providers (APPs -  Physician Assistants and Nurse Practitioners) who all work together to provide you with the care you need, when you need it.  We recommend signing up for the patient portal called "MyChart".  Sign up information is provided on this After Visit Summary.  MyChart is used to connect with patients for Virtual Visits (Telemedicine).  Patients are able to view lab/test results, encounter notes, upcoming appointments, etc.  Non-urgent messages can be sent to your provider as well.   To learn more about what you can do with MyChart, go to [email or web address].    Your next appointment:   6 month(s)  The format for your next appointment:     In Person  Provider:   Candee Furbish, MD  Thank you for choosing [facility]!!

## 2019-05-23 ENCOUNTER — Other Ambulatory Visit: Payer: Self-pay

## 2019-05-23 ENCOUNTER — Ambulatory Visit (HOSPITAL_COMMUNITY): Payer: Medicare Other | Attending: Cardiovascular Disease

## 2019-05-23 ENCOUNTER — Encounter: Payer: Self-pay | Admitting: *Deleted

## 2019-05-23 DIAGNOSIS — Z952 Presence of prosthetic heart valve: Secondary | ICD-10-CM | POA: Diagnosis present

## 2019-05-23 DIAGNOSIS — I359 Nonrheumatic aortic valve disorder, unspecified: Secondary | ICD-10-CM | POA: Diagnosis present

## 2019-11-09 ENCOUNTER — Other Ambulatory Visit: Payer: Medicare Other | Admitting: *Deleted

## 2019-11-09 ENCOUNTER — Other Ambulatory Visit: Payer: Self-pay

## 2019-11-09 DIAGNOSIS — E7800 Pure hypercholesterolemia, unspecified: Secondary | ICD-10-CM

## 2019-11-09 LAB — LIPID PANEL
Chol/HDL Ratio: 3.6 ratio (ref 0.0–5.0)
Cholesterol, Total: 115 mg/dL (ref 100–199)
HDL: 32 mg/dL — ABNORMAL LOW (ref >39)
LDL Chol Calc (NIH): 57 mg/dL (ref 0–99)
Triglycerides: 153 mg/dL — ABNORMAL HIGH (ref 0–149)
VLDL Cholesterol Cal: 26 mg/dL (ref 5–40)

## 2019-11-22 ENCOUNTER — Encounter: Payer: Self-pay | Admitting: *Deleted

## 2019-12-13 ENCOUNTER — Other Ambulatory Visit: Payer: Self-pay

## 2019-12-13 ENCOUNTER — Ambulatory Visit: Payer: Medicare Other | Admitting: Cardiology

## 2019-12-13 ENCOUNTER — Encounter: Payer: Self-pay | Admitting: Cardiology

## 2019-12-13 VITALS — BP 124/72 | HR 83 | Ht 73.0 in | Wt 240.2 lb

## 2019-12-13 DIAGNOSIS — I359 Nonrheumatic aortic valve disorder, unspecified: Secondary | ICD-10-CM | POA: Diagnosis not present

## 2019-12-13 DIAGNOSIS — Z952 Presence of prosthetic heart valve: Secondary | ICD-10-CM | POA: Diagnosis not present

## 2019-12-13 NOTE — Progress Notes (Signed)
Cardiology Office Note:    Date:  12/13/2019   ID:  Tim Hardy, DOB 1940/06/13, MRN [id number]  PCP:  Lavone Orn, MD  [facility] Cardiologist:  Candee Furbish, MD  [facility] Electrophysiologist:  None   Referring MD: Lavone Orn, MD     History of Present Illness:    Tim Hardy is a 79 y.o. male here for the follow-up of aortic valve replacement.  This occurred in September 2013 had nonobstructive CAD on cardiac catheterization with 30 to 40% LAD.  I used to take care of his mother as well.  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  He has alpacas now as well.  Had hernia repair by Dr. Dalbert Batman in 2020.  Seems to be doing quite well.  No chest pain fevers chills nausea vomiting syncope.  Past Medical History:  Diagnosis Date   Aortic valve stenosis, moderate    LVH   Asthma    Basal cell adenocarcinoma    BPH (benign prostatic hyperplasia)    Bronchitis    Cancer ([place])    skin cancer removed in August   Colon polyps    Colon, diverticulosis    Diastolic dysfunction    ED (erectile dysfunction)    GERD (gastroesophageal reflux disease)    Heart murmur    Hiatal hernia    Incarcerated umbilical hernia 5/63/1497   Perennial allergic rhinitis    Shortness of breath     Past Surgical History:  Procedure Laterality Date   AORTIC VALVE REPLACEMENT  11/03/2011   Procedure: AORTIC VALVE REPLACEMENT (AVR);  Surgeon: Melrose Nakayama, MD;  Location: [place];  Service: Open Heart Surgery;  Laterality: N/A;  Partial Sternotomy   APPENDECTOMY     CARDIAC CATHETERIZATION     COLONOSCOPY W/ POLYPECTOMY     COLONOSCOPY WITH PROPOFOL N/A 07/11/2014   Procedure: COLONOSCOPY WITH PROPOFOL;  Surgeon: Garlan Fair, MD;  [facility];  Service: Endoscopy;  Laterality: N/A;   ESOPHAGOGASTRODUODENOSCOPY (EGD) WITH PROPOFOL N/A 07/11/2014   Procedure: ESOPHAGOGASTRODUODENOSCOPY (EGD)  WITH PROPOFOL;  Surgeon: Garlan Fair, MD;  [facility];  Service: Endoscopy;  Laterality: N/A;   FEMORAL HERNIA REPAIR     , x5   HERNIA REPAIR Bilateral    KNEE ARTHROSCOPY Bilateral    SHOULDER ARTHROSCOPY Right    STERNOTOMY  11/03/2011   Procedure: STERNOTOMY;  Surgeon: Melrose Nakayama, MD;  Location: [place];  Service: Open Heart Surgery;  Laterality: N/A;  Partial   TONSILLECTOMY     UMBILICAL HERNIA REPAIR N/A 10/20/2018   Procedure: OPEN REPAIR UMBILICAL HERNIA WITH  MESH;  Surgeon: Fanny Skates, MD;  Location: [place];  Service: General;  Laterality: N/A;    Current Medications: Current Meds  Medication Sig   acetaminophen (TYLENOL) 500 MG tablet Take 500 mg by mouth every 6 (six) hours as needed.   ALPRAZolam (XANAX) 0.5 MG tablet Take 0.25-0.5 mg by mouth 2 (two) times daily as needed.   aspirin EC 81 MG tablet Take 81 mg by mouth every morning.    atorvastatin (LIPITOR) 10 MG tablet Take 5 mg by mouth daily.   cetirizine (ZYRTEC) 10 MG tablet Take 10 mg by mouth every morning.    Cholecalciferol (VITAMIN D PO) Take 1 tablet by mouth every morning.   clindamycin (CLEOCIN) 150 MG capsule Take 150 mg by mouth 4 (four) times daily.   Cyanocobalamin (  B-12 PO) Take 1 tablet by mouth every morning.    fluticasone (FLONASE) 50 MCG/ACT nasal spray Place 2 sprays into the nose daily as needed for allergies.    fluticasone (FLOVENT DISKUS) 50 MCG/BLIST diskus inhaler Inhale 1 puff into the lungs as needed.   HYDROcodone-acetaminophen (NORCO) 5-325 MG tablet Take 1-2 tablets by mouth every 6 (six) hours as needed for moderate pain or severe pain.   Multiple Vitamin (MULTIVITAMIN WITH MINERALS) TABS Take 1 tablet by mouth every morning.    omeprazole (PRILOSEC) 10 MG capsule Take 1 capsule by mouth daily.   Tamsulosin HCl (FLOMAX) 0.4 MG CAPS Take 0.4 mg by mouth daily after supper.      Allergies:   Penicillins   Social  History   Socioeconomic History   Marital status: Widowed    Spouse name: Not on file   Number of children: Not on file   Years of education: Not on file   Highest education level: Not on file  Occupational History   Occupation: retired  Tobacco Use   Smoking status: Former Smoker    Types: Cigarettes    Start date: 02/04/1971    Quit date: 02/03/1972    Years since quitting: 47.8   Smokeless tobacco: Current User    Types: Chew  Vaping Use   Vaping Use: Never used  Substance and Sexual Activity   Alcohol use: Yes    Comment: rare   Drug use: No   Sexual activity: Not on file  Other Topics Concern   Not on file  Social History Narrative   Retired Chief Financial Officer at [employer]    He is a Physicist, medical and enjoys renovation log cabins     Social Determinants of Radio broadcast assistant Strain:    Difficulty of Paying Living Expenses: Not on file  Food Insecurity:    Worried About Charity fundraiser in the Last Year: Not on file   [place] in the Last Year: Not on file  Transportation Needs:    Lack of Transportation (Medical): Not on file   Lack of Transportation (Non-Medical): Not on file  Physical Activity:    Days of Exercise per Week: Not on file   Minutes of Exercise per Session: Not on file  Stress:    Feeling of Stress : Not on file  Social Connections:    Frequency of Communication with Friends and Family: Not on file   Frequency of Social Gatherings with Friends and Family: Not on file   Attends Religious Services: Not on file   Active Member of Clubs or Organizations: Not on file   Attends Archivist Meetings: Not on file   Marital Status: Not on file     Family History: The patient's family history includes Cancer in his father; Diabetes in his sister and son; Heart attack in his mother; Hypertension in his father.  ROS:   Please see the history of present illness.     All other systems reviewed and are  negative.  EKGs/Labs/Other Studies Reviewed:    The following studies were reviewed today:  ECHO 05/23/19:  1. Left ventricular ejection fraction, by estimation, is 60 to 65%. The  left ventricle has normal function. The left ventricle has no regional  wall motion abnormalities. Left ventricular diastolic parameters are  consistent with Grade I diastolic  dysfunction (impaired relaxation).  2. Right ventricular systolic function is normal. The right ventricular  size is normal. There is  normal pulmonary artery systolic pressure.  3. Left atrial size was moderately dilated.  4. Right atrial size was mildly dilated.  5. The mitral valve is normal in structure. Trivial mitral valve  regurgitation. No evidence of mitral stenosis.  6. Post bioprosthetic AVR 2013 no PVL/AR gradients stable since echo done  March 2019 . The aortic valve has been repaired/replaced. Aortic valve  regurgitation is not visualized.  7. Aortic dilatation noted. There is mild dilatation of the aortic root  measuring 39 mm.  8. The inferior vena cava is normal in size with greater than 50%  respiratory variability, suggesting right atrial pressure of 3 mmHg.  Gradient 13 mmHg.  EKG:  EKG is  ordered today.  The ekg ordered today demonstrates sinus rhythm 83 nonspecific interventricular conduction delay.  Recent Labs: No results found for requested labs within last 8760 hours.  Recent Lipid Panel    Component Value Date/Time   CHOL 115 11/09/2019 0818   TRIG 153 (H) 11/09/2019 0818   HDL 32 (L) 11/09/2019 0818   CHOLHDL 3.6 11/09/2019 0818   CHOLHDL 3.9 10/15/2015 0811   VLDL 28 10/15/2015 0811   LDLCALC 57 11/09/2019 0818     Physical Exam:    VS:  BP 124/72    Pulse 83    Ht 6\' 1"  (1.854 m)    Wt 240 lb 3.2 oz (109 kg)    SpO2 93%    BMI 31.69 kg/m     Wt Readings from Last 3 Encounters:  12/13/19 240 lb 3.2 oz (109 kg)  05/10/19 235 lb 12.8 oz (107 kg)  11/04/18 235 lb (106.6 kg)      GEN:  Well nourished, well developed in no acute distress HEENT: Normal NECK: No JVD; No carotid bruits LYMPHATICS: No lymphadenopathy CARDIAC: RRR, no significant murmurs, rubs, gallops RESPIRATORY:  Clear to auscultation without rales, wheezing or rhonchi  ABDOMEN: Soft, non-tender, non-distended MUSCULOSKELETAL:  No edema; No deformity  SKIN: Warm and dry NEUROLOGIC:  Alert and oriented x 3 PSYCHIATRIC:  Normal affect   ASSESSMENT:    1. Aortic valve disorder   2. H/O aortic valve replacement    PLAN:    In order of problems listed above:  Aortic valve replacement --2mm [employer] Magna Ease Bovine  -Stable echocardiogram as above. -Stress test 2019 reassuring. -Dental prophylaxis -13 mmHg gradient. -Continue to monitor with echo next year.   Hyperlipidemia -Atorvastatin 5 mg.  Last LDL 57.  Excellent.  Had had episodes of mild elevation in bilirubin in the past. LDL previously 31, ALT 24 creatinine 1.1 hemoglobin 14.1.    Prostate cancer -Gleason score 6. Dr. Rosana Hoes. Stable.  PSA remains elevated but stable.    Shared Decision Making/Informed Consent      Medication Adjustments/Labs and Tests Ordered: Current medicines are reviewed at length with the patient today.  Concerns regarding medicines are outlined above.  Orders Placed This Encounter  Procedures   EKG 12-Lead   No orders of the defined types were placed in this encounter.   Patient Instructions  Medication Instructions:  The current medical regimen is effective;  continue present plan and medications.  *If you need a refill on your cardiac medications before your next appointment, please call your pharmacy*  Follow-Up: At [facility], you and your health needs are our priority.  As part of our continuing mission to provide you with exceptional heart care, we have created designated Provider Care Teams.  These Care Teams include your primary Cardiologist (  physician) and Advanced Practice  Providers (APPs -  Physician Assistants and Nurse Practitioners) who all work together to provide you with the care you need, when you need it.  We recommend signing up for the patient portal called "MyChart".  Sign up information is provided on this After Visit Summary.  MyChart is used to connect with patients for Virtual Visits (Telemedicine).  Patients are able to view lab/test results, encounter notes, upcoming appointments, etc.  Non-urgent messages can be sent to your provider as well.   To learn more about what you can do with MyChart, go to [email or web address].    Your next appointment:   6 month(s)  The format for your next appointment:   In Person  Provider:   Candee Furbish, MD   Thank you for choosing [facility]!!         Signed, Candee Furbish, MD  12/13/2019 3:39 PM    [place]

## 2019-12-13 NOTE — Patient Instructions (Signed)

## 2020-01-31 ENCOUNTER — Other Ambulatory Visit: Payer: Self-pay | Admitting: Urology

## 2020-01-31 DIAGNOSIS — R972 Elevated prostate specific antigen [PSA]: Secondary | ICD-10-CM

## 2020-02-13 DIAGNOSIS — L308 Other specified dermatitis: Secondary | ICD-10-CM | POA: Diagnosis not present

## 2020-02-13 DIAGNOSIS — L989 Disorder of the skin and subcutaneous tissue, unspecified: Secondary | ICD-10-CM | POA: Diagnosis not present

## 2020-02-13 DIAGNOSIS — L738 Other specified follicular disorders: Secondary | ICD-10-CM | POA: Diagnosis not present

## 2020-02-13 DIAGNOSIS — D485 Neoplasm of uncertain behavior of skin: Secondary | ICD-10-CM | POA: Diagnosis not present

## 2020-02-13 DIAGNOSIS — L821 Other seborrheic keratosis: Secondary | ICD-10-CM | POA: Diagnosis not present

## 2020-02-17 DIAGNOSIS — J4530 Mild persistent asthma, uncomplicated: Secondary | ICD-10-CM | POA: Diagnosis not present

## 2020-02-17 DIAGNOSIS — J45998 Other asthma: Secondary | ICD-10-CM | POA: Diagnosis not present

## 2020-02-17 DIAGNOSIS — K219 Gastro-esophageal reflux disease without esophagitis: Secondary | ICD-10-CM | POA: Diagnosis not present

## 2020-02-17 DIAGNOSIS — E7800 Pure hypercholesterolemia, unspecified: Secondary | ICD-10-CM | POA: Diagnosis not present

## 2020-02-22 DIAGNOSIS — N3000 Acute cystitis without hematuria: Secondary | ICD-10-CM | POA: Diagnosis not present

## 2020-02-28 ENCOUNTER — Ambulatory Visit
Admission: RE | Admit: 2020-02-28 | Discharge: 2020-02-28 | Disposition: A | Discharge status: Home / Self Care | Payer: Medicare Other | Source: Ambulatory Visit | Attending: Urology | Admitting: Urology

## 2020-02-28 DIAGNOSIS — R972 Elevated prostate specific antigen [PSA]: Secondary | ICD-10-CM

## 2020-02-28 MED ORDER — GADOBENATE DIMEGLUMINE 529 MG/ML IV SOLN
19.0000 mL | Freq: Once | INTRAVENOUS | Status: AC | PRN
  Administered 2020-02-28: 19 mL via INTRAVENOUS

## 2020-03-20 DIAGNOSIS — J45998 Other asthma: Secondary | ICD-10-CM | POA: Diagnosis not present

## 2020-03-20 DIAGNOSIS — J4530 Mild persistent asthma, uncomplicated: Secondary | ICD-10-CM | POA: Diagnosis not present

## 2020-03-20 DIAGNOSIS — K219 Gastro-esophageal reflux disease without esophagitis: Secondary | ICD-10-CM | POA: Diagnosis not present

## 2020-03-20 DIAGNOSIS — E7800 Pure hypercholesterolemia, unspecified: Secondary | ICD-10-CM | POA: Diagnosis not present

## 2020-04-19 DIAGNOSIS — N320 Bladder-neck obstruction: Secondary | ICD-10-CM | POA: Diagnosis not present

## 2020-04-23 DIAGNOSIS — J45998 Other asthma: Secondary | ICD-10-CM | POA: Diagnosis not present

## 2020-04-23 DIAGNOSIS — L819 Disorder of pigmentation, unspecified: Secondary | ICD-10-CM | POA: Diagnosis not present

## 2020-04-23 DIAGNOSIS — L814 Other melanin hyperpigmentation: Secondary | ICD-10-CM | POA: Diagnosis not present

## 2020-04-23 DIAGNOSIS — J4530 Mild persistent asthma, uncomplicated: Secondary | ICD-10-CM | POA: Diagnosis not present

## 2020-04-23 DIAGNOSIS — L308 Other specified dermatitis: Secondary | ICD-10-CM | POA: Diagnosis not present

## 2020-04-23 DIAGNOSIS — E7800 Pure hypercholesterolemia, unspecified: Secondary | ICD-10-CM | POA: Diagnosis not present

## 2020-04-23 DIAGNOSIS — K219 Gastro-esophageal reflux disease without esophagitis: Secondary | ICD-10-CM | POA: Diagnosis not present

## 2020-04-23 DIAGNOSIS — D225 Melanocytic nevi of trunk: Secondary | ICD-10-CM | POA: Diagnosis not present

## 2020-04-23 DIAGNOSIS — L821 Other seborrheic keratosis: Secondary | ICD-10-CM | POA: Diagnosis not present

## 2020-04-23 DIAGNOSIS — L570 Actinic keratosis: Secondary | ICD-10-CM | POA: Diagnosis not present

## 2020-05-05 DIAGNOSIS — J45909 Unspecified asthma, uncomplicated: Secondary | ICD-10-CM | POA: Diagnosis not present

## 2020-05-05 DIAGNOSIS — J309 Allergic rhinitis, unspecified: Secondary | ICD-10-CM | POA: Diagnosis not present

## 2020-05-16 DIAGNOSIS — J45998 Other asthma: Secondary | ICD-10-CM | POA: Diagnosis not present

## 2020-05-16 DIAGNOSIS — J4530 Mild persistent asthma, uncomplicated: Secondary | ICD-10-CM | POA: Diagnosis not present

## 2020-05-16 DIAGNOSIS — E7800 Pure hypercholesterolemia, unspecified: Secondary | ICD-10-CM | POA: Diagnosis not present

## 2020-05-16 DIAGNOSIS — K219 Gastro-esophageal reflux disease without esophagitis: Secondary | ICD-10-CM | POA: Diagnosis not present

## 2020-06-14 DIAGNOSIS — E7800 Pure hypercholesterolemia, unspecified: Secondary | ICD-10-CM | POA: Diagnosis not present

## 2020-06-14 DIAGNOSIS — K219 Gastro-esophageal reflux disease without esophagitis: Secondary | ICD-10-CM | POA: Diagnosis not present

## 2020-06-14 DIAGNOSIS — J45998 Other asthma: Secondary | ICD-10-CM | POA: Diagnosis not present

## 2020-06-14 DIAGNOSIS — J4530 Mild persistent asthma, uncomplicated: Secondary | ICD-10-CM | POA: Diagnosis not present

## 2020-07-13 DIAGNOSIS — J4530 Mild persistent asthma, uncomplicated: Secondary | ICD-10-CM | POA: Diagnosis not present

## 2020-07-13 DIAGNOSIS — K219 Gastro-esophageal reflux disease without esophagitis: Secondary | ICD-10-CM | POA: Diagnosis not present

## 2020-07-13 DIAGNOSIS — J45998 Other asthma: Secondary | ICD-10-CM | POA: Diagnosis not present

## 2020-07-13 DIAGNOSIS — E7800 Pure hypercholesterolemia, unspecified: Secondary | ICD-10-CM | POA: Diagnosis not present

## 2020-07-17 DIAGNOSIS — D126 Benign neoplasm of colon, unspecified: Secondary | ICD-10-CM | POA: Diagnosis not present

## 2020-07-17 DIAGNOSIS — K219 Gastro-esophageal reflux disease without esophagitis: Secondary | ICD-10-CM | POA: Diagnosis not present

## 2020-07-17 DIAGNOSIS — J4530 Mild persistent asthma, uncomplicated: Secondary | ICD-10-CM | POA: Diagnosis not present

## 2020-07-17 DIAGNOSIS — E7800 Pure hypercholesterolemia, unspecified: Secondary | ICD-10-CM | POA: Diagnosis not present

## 2020-07-17 DIAGNOSIS — Z0000 Encounter for general adult medical examination without abnormal findings: Secondary | ICD-10-CM | POA: Diagnosis not present

## 2020-07-17 DIAGNOSIS — Z800 Family history of malignant neoplasm of digestive organs: Secondary | ICD-10-CM | POA: Diagnosis not present

## 2020-07-17 DIAGNOSIS — Z1211 Encounter for screening for malignant neoplasm of colon: Secondary | ICD-10-CM | POA: Diagnosis not present

## 2020-07-17 DIAGNOSIS — Z1389 Encounter for screening for other disorder: Secondary | ICD-10-CM | POA: Diagnosis not present

## 2020-07-17 DIAGNOSIS — R7301 Impaired fasting glucose: Secondary | ICD-10-CM | POA: Diagnosis not present

## 2020-09-03 DIAGNOSIS — Z1211 Encounter for screening for malignant neoplasm of colon: Secondary | ICD-10-CM | POA: Diagnosis not present

## 2020-09-12 DIAGNOSIS — J4530 Mild persistent asthma, uncomplicated: Secondary | ICD-10-CM | POA: Diagnosis not present

## 2020-09-12 DIAGNOSIS — E7800 Pure hypercholesterolemia, unspecified: Secondary | ICD-10-CM | POA: Diagnosis not present

## 2020-09-12 DIAGNOSIS — J45998 Other asthma: Secondary | ICD-10-CM | POA: Diagnosis not present

## 2020-09-12 DIAGNOSIS — K219 Gastro-esophageal reflux disease without esophagitis: Secondary | ICD-10-CM | POA: Diagnosis not present

## 2020-10-15 DIAGNOSIS — J4530 Mild persistent asthma, uncomplicated: Secondary | ICD-10-CM | POA: Diagnosis not present

## 2020-10-15 DIAGNOSIS — J45998 Other asthma: Secondary | ICD-10-CM | POA: Diagnosis not present

## 2020-10-15 DIAGNOSIS — K219 Gastro-esophageal reflux disease without esophagitis: Secondary | ICD-10-CM | POA: Diagnosis not present

## 2020-10-15 DIAGNOSIS — E7800 Pure hypercholesterolemia, unspecified: Secondary | ICD-10-CM | POA: Diagnosis not present

## 2020-10-22 DIAGNOSIS — R208 Other disturbances of skin sensation: Secondary | ICD-10-CM | POA: Diagnosis not present

## 2020-10-22 DIAGNOSIS — Z86007 Personal history of in-situ neoplasm of skin: Secondary | ICD-10-CM | POA: Diagnosis not present

## 2020-10-22 DIAGNOSIS — L570 Actinic keratosis: Secondary | ICD-10-CM | POA: Diagnosis not present

## 2020-10-22 DIAGNOSIS — L814 Other melanin hyperpigmentation: Secondary | ICD-10-CM | POA: Diagnosis not present

## 2020-10-22 DIAGNOSIS — C4442 Squamous cell carcinoma of skin of scalp and neck: Secondary | ICD-10-CM | POA: Diagnosis not present

## 2020-10-22 DIAGNOSIS — L538 Other specified erythematous conditions: Secondary | ICD-10-CM | POA: Diagnosis not present

## 2020-10-22 DIAGNOSIS — D225 Melanocytic nevi of trunk: Secondary | ICD-10-CM | POA: Diagnosis not present

## 2020-10-22 DIAGNOSIS — Z08 Encounter for follow-up examination after completed treatment for malignant neoplasm: Secondary | ICD-10-CM | POA: Diagnosis not present

## 2020-10-22 DIAGNOSIS — L298 Other pruritus: Secondary | ICD-10-CM | POA: Diagnosis not present

## 2020-10-22 DIAGNOSIS — L84 Corns and callosities: Secondary | ICD-10-CM | POA: Diagnosis not present

## 2020-10-22 DIAGNOSIS — D485 Neoplasm of uncertain behavior of skin: Secondary | ICD-10-CM | POA: Diagnosis not present

## 2020-10-22 DIAGNOSIS — Z85828 Personal history of other malignant neoplasm of skin: Secondary | ICD-10-CM | POA: Diagnosis not present

## 2020-10-22 DIAGNOSIS — L821 Other seborrheic keratosis: Secondary | ICD-10-CM | POA: Diagnosis not present

## 2020-10-25 DIAGNOSIS — N320 Bladder-neck obstruction: Secondary | ICD-10-CM | POA: Diagnosis not present

## 2020-11-22 NOTE — Progress Notes (Signed)
Cardiology Office Note:    Date:  11/23/2020   ID:  Tim Hardy, DOB Jul 27, 1940, MRN [id number]  PCP:  Lavone Orn, MD  [facility] Cardiologist:  Candee Furbish, MD  [facility] Electrophysiologist:  None   Referring MD: Lavone Orn, MD    History of Present Illness:    Tim Hardy is a 80 y.o. male here for the follow-up of coronary artery disease and aortic stenosis.  Aortic valve replacement occurred in September 2013 had nonobstructive CAD on cardiac catheterization with 30 to 40% LAD.  I used to take care of his mother as well.  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  He has alpacas now as well.  Had hernia repair by Dr. Dalbert Batman in 2020.  At his last appointment he seemed to be doing quite well.    Today: Overall he is feeling pretty good. He states he may "overdo it sometimes", such as when using a chainsaw to clean debris or trim trees and hedges. However he denies any chest pain or shortness of breath.  His main complaint in the past year has been arthritic pain in his hips and knees.  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  He continues to work on his diet, but notes it is difficult to avoid carbs and sodium at times. Generally he does avoid carbonated drinks and alcohol.  He denies any palpitations. No lightheadedness, headaches, syncope, orthopnea, PND, or lower extremity edema.   Past Medical History:  Diagnosis Date   Aortic valve stenosis, moderate    LVH   Asthma    Basal cell adenocarcinoma    BPH (benign prostatic hyperplasia)    Bronchitis    Cancer ([place])    skin cancer removed in August   Colon polyps    Colon, diverticulosis    Diastolic dysfunction    ED (erectile dysfunction)    GERD (gastroesophageal reflux disease)    Heart murmur    Hiatal hernia    Incarcerated umbilical hernia 7/61/6073   Perennial allergic rhinitis    Shortness of breath     Past Surgical  History:  Procedure Laterality Date   AORTIC VALVE REPLACEMENT  11/03/2011   Procedure: AORTIC VALVE REPLACEMENT (AVR);  Surgeon: Melrose Nakayama, MD;  Location: [place];  Service: Open Heart Surgery;  Laterality: N/A;  Partial Sternotomy   APPENDECTOMY     CARDIAC CATHETERIZATION     COLONOSCOPY W/ POLYPECTOMY     COLONOSCOPY WITH PROPOFOL N/A 07/11/2014   Procedure: COLONOSCOPY WITH PROPOFOL;  Surgeon: Garlan Fair, MD;  [facility];  Service: Endoscopy;  Laterality: N/A;   ESOPHAGOGASTRODUODENOSCOPY (EGD) WITH PROPOFOL N/A 07/11/2014   Procedure: ESOPHAGOGASTRODUODENOSCOPY (EGD) WITH PROPOFOL;  Surgeon: Garlan Fair, MD;  [facility];  Service: Endoscopy;  Laterality: N/A;   FEMORAL HERNIA REPAIR     , x5   HERNIA REPAIR Bilateral    KNEE ARTHROSCOPY Bilateral    SHOULDER ARTHROSCOPY Right    STERNOTOMY  11/03/2011   Procedure: STERNOTOMY;  Surgeon: Melrose Nakayama, MD;  Location: [place];  Service: Open Heart Surgery;  Laterality: N/A;  Partial   TONSILLECTOMY     UMBILICAL HERNIA REPAIR N/A 10/20/2018   Procedure: OPEN REPAIR UMBILICAL HERNIA WITH  MESH;  Surgeon: Fanny Skates, MD;  Location: [place];  Service: General;  Laterality: N/A;    Current Medications: Current Meds  Medication Sig  acetaminophen (TYLENOL) 500 MG tablet Take 500 mg by mouth every 6 (six) hours as needed.   ALPRAZolam (XANAX) 0.5 MG tablet Take 0.25-0.5 mg by mouth 2 (two) times daily as needed.   aspirin EC 81 MG tablet Take 81 mg by mouth every morning.    atorvastatin (LIPITOR) 10 MG tablet Take 5 mg by mouth daily.   cetirizine (ZYRTEC) 10 MG tablet Take 10 mg by mouth every morning.    Cholecalciferol (VITAMIN D PO) Take 1 tablet by mouth every morning.   clindamycin (CLEOCIN) 150 MG capsule Take 150 mg by mouth 4 (four) times daily.   Cyanocobalamin (B-12 PO) Take 1 tablet by mouth every morning.    fluticasone (FLONASE) 50 MCG/ACT nasal spray  Place 2 sprays into the nose daily as needed for allergies.    fluticasone (FLOVENT DISKUS) 50 MCG/BLIST diskus inhaler Inhale 1 puff into the lungs as needed.   HYDROcodone-acetaminophen (NORCO) 5-325 MG tablet Take 1-2 tablets by mouth every 6 (six) hours as needed for moderate pain or severe pain.   Multiple Vitamin (MULTIVITAMIN WITH MINERALS) TABS Take 1 tablet by mouth every morning.    omeprazole (PRILOSEC) 10 MG capsule Take 1 capsule by mouth daily.   Tamsulosin HCl (FLOMAX) 0.4 MG CAPS Take 0.4 mg by mouth daily after supper.      Allergies:   Penicillins   Social History   Socioeconomic History   Marital status: Widowed    Spouse name: Not on file   Number of children: Not on file   Years of education: Not on file   Highest education level: Not on file  Occupational History   Occupation: retired  Tobacco Use   Smoking status: Former    Types: Cigarettes    Start date: 02/04/1971    Quit date: 02/03/1972    Years since quitting: 48.8   Smokeless tobacco: Current    Types: Chew  Vaping Use   Vaping Use: Never used  Substance and Sexual Activity   Alcohol use: Yes    Comment: rare   Drug use: No   Sexual activity: Not on file  Other Topics Concern   Not on file  Social History Narrative   Retired Chief Financial Officer at [employer]    He is a Physicist, medical and enjoys renovation log cabins     Social Determinants of Radio broadcast assistant Strain: Not on file  Food Insecurity: Not on file  Transportation Needs: Not on file  Physical Activity: Not on file  Stress: Not on file  Social Connections: Not on file     Family History: The patient's family history includes Cancer in his father; Diabetes in his sister and son; Heart attack in his mother; Hypertension in his father.  ROS:   Please see the history of present illness.    (+) Arthralgia, bilateral hips and knees All other systems reviewed and are negative.  EKGs/Labs/Other Studies Reviewed:    The  following studies were reviewed today:  ECHO 05/23/19:  1. Left ventricular ejection fraction, by estimation, is 60 to 65%. The  left ventricle has normal function. The left ventricle has no regional  wall motion abnormalities. Left ventricular diastolic parameters are  consistent with Grade I diastolic  dysfunction (impaired relaxation).   2. Right ventricular systolic function is normal. The right ventricular  size is normal. There is normal pulmonary artery systolic pressure.   3. Left atrial size was moderately dilated.   4. Right atrial size was  mildly dilated.   5. The mitral valve is normal in structure. Trivial mitral valve  regurgitation. No evidence of mitral stenosis.   6. Post bioprosthetic AVR 2013 no PVL/AR gradients stable since echo done  March 2019 . The aortic valve has been repaired/replaced. Aortic valve  regurgitation is not visualized.   7. Aortic dilatation noted. There is mild dilatation of the aortic root  measuring 39 mm.   8. The inferior vena cava is normal in size with greater than 50%  respiratory variability, suggesting right atrial pressure of 3 mmHg.  Gradient 13 mmHg.  Exercise Stress Test 10/16/2017: There was no ST segment deviation noted during stress. This is a low risk study.   1. No evidence for ischemia by ST segment analysis, but there were PVCs pre-test, during stress, and during recovery.  2. Study was not gated.  3. Fixed small, mild mid-apical inferior perfusion defect.  No evidence for ischemia.  Possible diaphragmatic attenuation, cannot rule out prior small infarction as cannot assess wall motion.    Low risk study.   EKG:   11/23/2020: Sinus rhythm. Rate 78 bpm. Left anterior fascicular block. 12/13/2019: sinus rhythm 83 nonspecific interventricular conduction delay.  Recent Labs: No results found for requested labs within last 8760 hours.   Recent Lipid Panel    Component Value Date/Time   CHOL 115 11/09/2019 0818   TRIG 153  (H) 11/09/2019 0818   HDL 32 (L) 11/09/2019 0818   CHOLHDL 3.6 11/09/2019 0818   CHOLHDL 3.9 10/15/2015 0811   VLDL 28 10/15/2015 0811   LDLCALC 57 11/09/2019 0818     Physical Exam:     VS:  BP 140/80 (BP Location: Left Arm, Patient Position: Sitting, Cuff Size: Normal)   Pulse 78   Ht 6\' 1"  (1.854 m)   Wt 236 lb (107 kg)   SpO2 96%   BMI 31.14 kg/m     Wt Readings from Last 3 Encounters:  11/23/20 236 lb (107 kg)  12/13/19 240 lb 3.2 oz (109 kg)  05/10/19 235 lb 12.8 oz (107 kg)     GEN: Well nourished, well developed in no acute distress HEENT: Normal NECK: No JVD; No carotid bruits LYMPHATICS: No lymphadenopathy CARDIAC: RRR, no murmurs, rubs, gallops RESPIRATORY:  Clear to auscultation without rales, wheezing or rhonchi  ABDOMEN: Soft, non-tender, non-distended MUSCULOSKELETAL:  No edema; No deformity  SKIN: Warm and dry NEUROLOGIC:  Alert and oriented x 3 PSYCHIATRIC:  Normal affect    ASSESSMENT:    1. Aortic valve disorder   2. H/O aortic valve replacement   3. Atherosclerosis of native coronary artery of native heart without angina pectoris   4. Pure hypercholesterolemia     PLAN:    In order of problems listed above: H/O aortic valve replacement Aortic valve replacement 25 mm [employer] Ease bovine.  2013.  Doing well.  Echocardiogram 2021 excellent.  Stable gradients.  He is feeling very well.  No chest pain no shortness of breath.  Trying to lose weight.  Coronary atherosclerosis of native coronary artery Nonobstructive coronary artery disease, EF 30 to 40% LAD in 2013.  Continuing with aggressive risk factor modification.  Pure hypercholesterolemia Continue with atorvastatin only 5 mg once a day, his LDL is 50.  Tolerating this well.  Continue.  No myalgias.      Shared Decision Making/Informed Consent     Follow-up:  6 months.  Medication Adjustments/Labs and Tests Ordered: Current medicines are reviewed at length with the  patient  today.  Concerns regarding medicines are outlined above.   Orders Placed This Encounter  Procedures   EKG 12-Lead    No orders of the defined types were placed in this encounter.  Patient Instructions  Medication Instructions:  The current medical regimen is effective;  continue present plan and medications.  *If you need a refill on your cardiac medications before your next appointment, please call your pharmacy*  Follow-Up: At [facility], you and your health needs are our priority.  As part of our continuing mission to provide you with exceptional heart care, we have created designated Provider Care Teams.  These Care Teams include your primary Cardiologist (physician) and Advanced Practice Providers (APPs -  Physician Assistants and Nurse Practitioners) who all work together to provide you with the care you need, when you need it.  We recommend signing up for the patient portal called "MyChart".  Sign up information is provided on this After Visit Summary.  MyChart is used to connect with patients for Virtual Visits (Telemedicine).  Patients are able to view lab/test results, encounter notes, upcoming appointments, etc.  Non-urgent messages can be sent to your provider as well.   To learn more about what you can do with MyChart, go to [email or web address].    Your next appointment:   6 month(s)  The format for your next appointment:   In Person  Provider:   Candee Furbish, MD   Thank you for choosing [facility]!!     I,Mathew Stumpf,acting as a scribe for Candee Furbish, MD.,have documented all relevant documentation on the behalf of Candee Furbish, MD,as directed by  Candee Furbish, MD while in the presence of Candee Furbish, MD.  I, Candee Furbish, MD, have reviewed all documentation for this visit. The documentation on 11/23/20 for the exam, diagnosis, procedures, and orders are all accurate and complete.   Signed, Candee Furbish, MD  11/23/2020 10:06 AM    [place]

## 2020-11-23 ENCOUNTER — Other Ambulatory Visit: Payer: Self-pay

## 2020-11-23 ENCOUNTER — Encounter: Payer: Self-pay | Admitting: Cardiology

## 2020-11-23 ENCOUNTER — Ambulatory Visit: Payer: Medicare Other | Admitting: Cardiology

## 2020-11-23 VITALS — BP 140/80 | HR 78 | Ht 73.0 in | Wt 236.0 lb

## 2020-11-23 DIAGNOSIS — I2510 Atherosclerotic heart disease of native coronary artery without angina pectoris: Secondary | ICD-10-CM

## 2020-11-23 DIAGNOSIS — Z952 Presence of prosthetic heart valve: Secondary | ICD-10-CM | POA: Diagnosis not present

## 2020-11-23 DIAGNOSIS — I359 Nonrheumatic aortic valve disorder, unspecified: Secondary | ICD-10-CM | POA: Diagnosis not present

## 2020-11-23 DIAGNOSIS — E7800 Pure hypercholesterolemia, unspecified: Secondary | ICD-10-CM

## 2020-11-23 NOTE — Assessment & Plan Note (Signed)
Nonobstructive coronary artery disease, EF 30 to 40% LAD in 2013.  Continuing with aggressive risk factor modification.

## 2020-11-23 NOTE — Assessment & Plan Note (Signed)
Aortic valve replacement 25 mm [employer] Ease bovine.  2013.  Doing well.  Echocardiogram 2021 excellent.  Stable gradients.  He is feeling very well.  No chest pain no shortness of breath.  Trying to lose weight.

## 2020-11-23 NOTE — Assessment & Plan Note (Signed)
Continue with atorvastatin only 5 mg once a day, his LDL is 50.  Tolerating this well.  Continue.  No myalgias.

## 2020-11-23 NOTE — Patient Instructions (Signed)

## 2020-12-07 DIAGNOSIS — L570 Actinic keratosis: Secondary | ICD-10-CM | POA: Diagnosis not present

## 2020-12-07 DIAGNOSIS — D044 Carcinoma in situ of skin of scalp and neck: Secondary | ICD-10-CM | POA: Diagnosis not present

## 2020-12-10 DIAGNOSIS — K219 Gastro-esophageal reflux disease without esophagitis: Secondary | ICD-10-CM | POA: Diagnosis not present

## 2020-12-10 DIAGNOSIS — E7800 Pure hypercholesterolemia, unspecified: Secondary | ICD-10-CM | POA: Diagnosis not present

## 2020-12-10 DIAGNOSIS — J45998 Other asthma: Secondary | ICD-10-CM | POA: Diagnosis not present

## 2020-12-10 DIAGNOSIS — J4530 Mild persistent asthma, uncomplicated: Secondary | ICD-10-CM | POA: Diagnosis not present

## 2021-01-14 DIAGNOSIS — R7301 Impaired fasting glucose: Secondary | ICD-10-CM | POA: Diagnosis not present

## 2021-01-14 DIAGNOSIS — K219 Gastro-esophageal reflux disease without esophagitis: Secondary | ICD-10-CM | POA: Diagnosis not present

## 2021-01-14 DIAGNOSIS — J3489 Other specified disorders of nose and nasal sinuses: Secondary | ICD-10-CM | POA: Diagnosis not present

## 2021-01-14 DIAGNOSIS — Z23 Encounter for immunization: Secondary | ICD-10-CM | POA: Diagnosis not present

## 2021-01-14 DIAGNOSIS — J45998 Other asthma: Secondary | ICD-10-CM | POA: Diagnosis not present

## 2021-01-23 DIAGNOSIS — M160 Bilateral primary osteoarthritis of hip: Secondary | ICD-10-CM | POA: Diagnosis not present

## 2021-01-23 DIAGNOSIS — M5136 Other intervertebral disc degeneration, lumbar region: Secondary | ICD-10-CM | POA: Diagnosis not present

## 2021-01-23 DIAGNOSIS — M170 Bilateral primary osteoarthritis of knee: Secondary | ICD-10-CM | POA: Diagnosis not present

## 2021-02-21 DIAGNOSIS — E7800 Pure hypercholesterolemia, unspecified: Secondary | ICD-10-CM | POA: Diagnosis not present

## 2021-02-21 DIAGNOSIS — J4530 Mild persistent asthma, uncomplicated: Secondary | ICD-10-CM | POA: Diagnosis not present

## 2021-02-21 DIAGNOSIS — K219 Gastro-esophageal reflux disease without esophagitis: Secondary | ICD-10-CM | POA: Diagnosis not present

## 2021-02-21 DIAGNOSIS — I10 Essential (primary) hypertension: Secondary | ICD-10-CM | POA: Diagnosis not present

## 2021-03-05 DIAGNOSIS — Z86007 Personal history of in-situ neoplasm of skin: Secondary | ICD-10-CM | POA: Diagnosis not present

## 2021-03-05 DIAGNOSIS — Z85828 Personal history of other malignant neoplasm of skin: Secondary | ICD-10-CM | POA: Diagnosis not present

## 2021-03-05 DIAGNOSIS — L570 Actinic keratosis: Secondary | ICD-10-CM | POA: Diagnosis not present

## 2021-03-05 DIAGNOSIS — Z08 Encounter for follow-up examination after completed treatment for malignant neoplasm: Secondary | ICD-10-CM | POA: Diagnosis not present

## 2021-03-07 DIAGNOSIS — E7800 Pure hypercholesterolemia, unspecified: Secondary | ICD-10-CM | POA: Diagnosis not present

## 2021-03-07 DIAGNOSIS — J45998 Other asthma: Secondary | ICD-10-CM | POA: Diagnosis not present

## 2021-03-07 DIAGNOSIS — I10 Essential (primary) hypertension: Secondary | ICD-10-CM | POA: Diagnosis not present

## 2021-05-02 ENCOUNTER — Other Ambulatory Visit: Payer: Self-pay | Admitting: Urology

## 2021-05-02 DIAGNOSIS — R972 Elevated prostate specific antigen [PSA]: Secondary | ICD-10-CM

## 2021-05-20 DIAGNOSIS — Z85828 Personal history of other malignant neoplasm of skin: Secondary | ICD-10-CM | POA: Diagnosis not present

## 2021-05-20 DIAGNOSIS — Z08 Encounter for follow-up examination after completed treatment for malignant neoplasm: Secondary | ICD-10-CM | POA: Diagnosis not present

## 2021-05-20 DIAGNOSIS — L538 Other specified erythematous conditions: Secondary | ICD-10-CM | POA: Diagnosis not present

## 2021-05-20 DIAGNOSIS — D485 Neoplasm of uncertain behavior of skin: Secondary | ICD-10-CM | POA: Diagnosis not present

## 2021-05-20 DIAGNOSIS — L570 Actinic keratosis: Secondary | ICD-10-CM | POA: Diagnosis not present

## 2021-05-20 DIAGNOSIS — Z09 Encounter for follow-up examination after completed treatment for conditions other than malignant neoplasm: Secondary | ICD-10-CM | POA: Diagnosis not present

## 2021-05-23 ENCOUNTER — Ambulatory Visit
Admission: RE | Admit: 2021-05-23 | Discharge: 2021-05-23 | Disposition: A | Discharge status: Home / Self Care | Payer: Medicare Other | Source: Ambulatory Visit | Attending: Urology | Admitting: Urology

## 2021-05-23 DIAGNOSIS — R972 Elevated prostate specific antigen [PSA]: Secondary | ICD-10-CM

## 2021-05-23 DIAGNOSIS — K4090 Unilateral inguinal hernia, without obstruction or gangrene, not specified as recurrent: Secondary | ICD-10-CM | POA: Diagnosis not present

## 2021-05-23 DIAGNOSIS — K6289 Other specified diseases of anus and rectum: Secondary | ICD-10-CM | POA: Diagnosis not present

## 2021-05-23 MED ORDER — GADOBENATE DIMEGLUMINE 529 MG/ML IV SOLN
20.0000 mL | Freq: Once | INTRAVENOUS | Status: AC | PRN
  Administered 2021-05-23: 20 mL via INTRAVENOUS

## 2021-05-30 NOTE — Progress Notes (Signed)
? ?Cardiology Office Note   ? ?Date:  06/04/2021  ? ?ID:  Tim Hardy, DOB [birth year], MRN [id number] ? ?PCP:  Lavone Orn, MD  ?Cardiologist:  Candee Furbish, MD  ?Electrophysiologist:  None  ? ?Chief Complaint: f/u CAD, pericardial AVR ? ?History of Present Illness:  ? ?Tim Hardy is a 81 y.o. 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 Last nuc was in 2019 and felt to be low risk. Last echo 05/2019 showed normal EF, g1DD, moderate LAE, mild RAE, stable gradients of AVR, mild dilation of aortic root. ? ?He is seen back for follow-up today doing well. 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 He reports generalized fatigue that tends to happen specifically during allergy season but this is not new and has happened for many years. Overall he is pleased with how he is doing. He does not routinely follow his BP at home but does note that it has been sporadically up when he goes to the doctor's office. ? ? ?Labwork independently reviewed: ?[facility] 07/2020 LDL 50, trig 204 ?11/2019 trig 153, LDL 56 ?07/2019 K 4, Cr 1.030 ?10/2018 K 3.9, Cr 1.15, LFTs ok, CBC wnl ? ?Cardiology Studies:  ? ?Studies reviewed are outlined and summarized above. Reports included below if pertinent.  ? ?2D echo 05/2019 ? ? 1. Left ventricular ejection fraction, by estimation, is 60 to 65%. The  ?left ventricle has normal function. The left ventricle has no regional  ?wall motion abnormalities. Left ventricular diastolic parameters are  ?consistent with Grade I diastolic  ?dysfunction (impaired relaxation).  ? 2. Right ventricular systolic function is normal. The right ventricular  ?size is normal. There is normal pulmonary artery systolic pressure.  ? 3. Left atrial size was  moderately dilated.  ? 4. Right atrial size was mildly dilated.  ? 5. The mitral valve is normal in structure. Trivial mitral valve  ?regurgitation. No evidence of mitral stenosis.  ? 6. Post bioprosthetic AVR 2013 no PVL/AR gradients stable since echo done  ?March 2019 . The aortic valve has been repaired/replaced. Aortic valve  ?regurgitation is not visualized.  ? 7. Aortic dilatation noted. There is mild dilatation of the aortic root  ?measuring 39 mm.  ? 8. The inferior vena cava is normal in size with greater than 50%  ?respiratory variability, suggesting right atrial pressure of 3 mmHg ? ?Nuc 10/2017 ? ? 1. Left ventricular ejection fraction, by estimation, is 60 to 65%. The  ?left ventricle has normal function. The left ventricle has no regional  ?wall motion abnormalities. Left ventricular diastolic parameters are  ?consistent with Grade I diastolic  ?dysfunction (impaired relaxation).  ? 2. Right ventricular systolic function is normal. The right ventricular  ?size is normal. There is normal pulmonary artery systolic pressure.  ? 3. Left atrial size was moderately dilated.  ? 4. Right atrial size was mildly dilated.  ? 5. The mitral valve is normal in structure. Trivial mitral valve  ?regurgitation. No evidence of mitral stenosis.  ? 6. Post bioprosthetic AVR 2013 no PVL/AR gradients stable since echo done  ?March 2019 . The aortic valve has been repaired/replaced. Aortic valve  ?regurgitation is not visualized.  ? 7. Aortic dilatation noted. There is mild dilatation of the  aortic root  ?measuring 39 mm.  ? 8. The inferior vena cava is normal in size with greater than 50%  ?respiratory variability, suggesting right atrial pressure of 3 mmHg ? ?2013 ?ANGIOGRAPHIC DATA:   ?  ?Left main: No angiographically significant coronary artery disease branches into LAD and circumflex artery ?  ?Left anterior descending (LAD): There is mid LAD calcification at the bifurcation of a mid diagonal branch. Mild stenosis of  approximately 30-40% noted in that region. No flow limiting disease present. The LAD then continues to wrap around the apex. There are 5 relatively small caliber diagonal branches. The first 2 proximal branches demonstrate minor ostial disease. ?  ?Circumflex artery (CIRC): There are 3 obtuse marginal branches. No angiographically significant disease present. ?  ?Right coronary artery (RCA): Dominant vessel giving rise to the posterior descending artery. No angiographically significant disease. ?  ?Ascending aortogram/descending aortogram: Performed in the LAO position. There is no evidence of coarctation of the aorta. There is no evidence of abdominal aortic aneurysm. Common iliacs are widely patent. Common femoral arteries are widely patent. Minor aortic sclerosis in the ascending aorta noted. Minor calcification of the left subclavian proximal artery noted. ?Severe calcification of the aortic valve with limited cusp excursion. ?IMPRESSIONS: ?  ?Minor mid LAD 30-40% stenosis/calcified with otherwise no angiographically significant disease present. No flow limiting disease. ?No angiographic evidence of coarctation/abdominal aortic aneurysm. ?Normal right-sided heart pressures. ?Normal cardiac output. ?5.  Severely calcified aortic valve. Echocardiogram demonstrates severe aortic stenosis. ?  ?RECOMMENDATION:  I have put in referral for cardiothoracic surgery for aortic valve replacement in the setting of symptomatic severe aortic stenosis. Once again, I cannot exclude bicuspid aortic valve based upon transthoracic echocardiogram. There does not appear to be evidence of coarctation. If transesophageal echocardiogram is needed prior to surgery, I will be happy to perform at the request of surgery. Explained findings to patient.  ? ? ?Past Medical History:  ?Diagnosis Date  ? Asthma   ? Basal cell adenocarcinoma   ? BPH (benign prostatic hyperplasia)   ? Bronchitis   ? Cancer [facility])   ? skin cancer removed in August  ?  Colon polyps   ? Colon, diverticulosis   ? Diastolic dysfunction   ? Dilated aortic root ([place])   ? ED (erectile dysfunction)   ? GERD (gastroesophageal reflux disease)   ? Heart murmur   ? Hiatal hernia   ? History of aortic valve replacement with bioprosthetic valve   ? Incarcerated umbilical hernia [id number]  ? Mild CAD   ? Perennial allergic rhinitis   ? Shortness of breath   ? ? ?Past Surgical History:  ?Procedure Laterality Date  ? AORTIC VALVE REPLACEMENT  11/03/2011  ? Procedure: AORTIC VALVE REPLACEMENT (AVR);  Surgeon: Melrose Nakayama, MD;  Location: [place];  Service: Open Heart Surgery;  Laterality: N/A;  Partial Sternotomy  ? APPENDECTOMY    ? CARDIAC CATHETERIZATION    ? COLONOSCOPY W/ POLYPECTOMY    ? COLONOSCOPY WITH PROPOFOL N/A 07/11/2014  ? Procedure: COLONOSCOPY WITH PROPOFOL;  Surgeon: Garlan Fair, MD;  Location: [facility];  Service: Endoscopy;  Laterality: N/A;  ? ESOPHAGOGASTRODUODENOSCOPY (EGD) WITH PROPOFOL N/A 07/11/2014  ? Procedure: ESOPHAGOGASTRODUODENOSCOPY (EGD) WITH PROPOFOL;  Surgeon: Garlan Fair, MD;  Location: [facility];  Service: Endoscopy;  Laterality: N/A;  ? FEMORAL HERNIA REPAIR    ? , x5  ? HERNIA REPAIR Bilateral   ? KNEE ARTHROSCOPY Bilateral   ? SHOULDER ARTHROSCOPY Right   ?  STERNOTOMY  11/03/2011  ? Procedure: STERNOTOMY;  Surgeon: Melrose Nakayama, MD;  Location: [place];  Service: Open Heart Surgery;  Laterality: N/A;  Partial  ? TONSILLECTOMY    ? UMBILICAL HERNIA REPAIR N/A 10/20/2018  ? Procedure: OPEN REPAIR UMBILICAL HERNIA WITH  MESH;  Surgeon: Fanny Skates, MD;  Location: [place];  Service: General;  Laterality: N/A;  ? ? ?Current Medications: ?Current Meds  ?Medication Sig  ? acetaminophen (TYLENOL) 500 MG tablet Take 500 mg by mouth every 6 (six) hours as needed.  ? ALPRAZolam (XANAX) 0.5 MG tablet Take 0.25-0.5 mg by mouth 2 (two) times daily as needed.  ? aspirin EC 81 MG tablet Take 81 mg by mouth every morning.   ?  atorvastatin (LIPITOR) 10 MG tablet Take 5 mg by mouth daily.  ? cetirizine (ZYRTEC) 10 MG tablet Take 10 mg by mouth as needed.  ? Cholecalciferol (VITAMIN D PO) Take 1 tablet by mouth every morning.  ? Cyanocobala

## 2021-06-03 ENCOUNTER — Ambulatory Visit: Payer: Medicare Other | Admitting: Cardiology

## 2021-06-03 ENCOUNTER — Encounter: Payer: Self-pay | Admitting: Physician Assistant

## 2021-06-04 ENCOUNTER — Ambulatory Visit: Payer: Medicare Other | Admitting: Physician Assistant

## 2021-06-04 ENCOUNTER — Encounter: Payer: Self-pay | Admitting: Physician Assistant

## 2021-06-04 VITALS — BP 140/82 | HR 81 | Ht 73.0 in | Wt 235.2 lb

## 2021-06-04 DIAGNOSIS — I2510 Atherosclerotic heart disease of native coronary artery without angina pectoris: Secondary | ICD-10-CM | POA: Diagnosis not present

## 2021-06-04 DIAGNOSIS — Z953 Presence of xenogenic heart valve: Secondary | ICD-10-CM | POA: Diagnosis not present

## 2021-06-04 DIAGNOSIS — I10 Essential (primary) hypertension: Secondary | ICD-10-CM

## 2021-06-04 DIAGNOSIS — Z13220 Encounter for screening for lipoid disorders: Secondary | ICD-10-CM

## 2021-06-04 DIAGNOSIS — I350 Nonrheumatic aortic (valve) stenosis: Secondary | ICD-10-CM | POA: Diagnosis not present

## 2021-06-04 DIAGNOSIS — I77810 Thoracic aortic ectasia: Secondary | ICD-10-CM | POA: Diagnosis not present

## 2021-06-04 MED ORDER — AZITHROMYCIN 250 MG PO TABS: ORAL_TABLET | ORAL | 1 refills | Status: AC

## 2021-06-04 NOTE — Patient Instructions (Signed)
Medication Instructions:  ?TAKE Azithromycin 30-60 minutes prior to dental procedures  ?*If you need a refill on your cardiac medications before your next appointment, please call your pharmacy* ? ? ?Lab Work: ?TODAY-CBC, CMET, TSH ?If you have labs (blood work) drawn today and your tests are completely normal, you will receive your results only by: ?MyChart Message (if you have MyChart) OR ?A paper copy in the mail ?If you have any lab test that is abnormal or we need to change your treatment, we will call you to review the results. ? ? ?Testing/Procedures: ?Your physician has requested that you have an echocardiogram. Echocardiography is a painless test that uses sound waves to create images of your heart. It provides your doctor with information about the size and shape of your heart and how well your heart?s chambers and valves are working. This procedure takes approximately one hour. There are no restrictions for this procedure. ? ? ?Follow-Up: ?At [facility], you and your health needs are our priority.  As part of our continuing mission to provide you with exceptional heart care, we have created designated Provider Care Teams.  These Care Teams include your primary Cardiologist (physician) and Advanced Practice Providers (APPs -  Physician Assistants and Nurse Practitioners) who all work together to provide you with the care you need, when you need it. ? ?We recommend signing up for the patient portal called "MyChart".  Sign up information is provided on this After Visit Summary.  MyChart is used to connect with patients for Virtual Visits (Telemedicine).  Patients are able to view lab/test results, encounter notes, upcoming appointments, etc.  Non-urgent messages can be sent to your provider as well.   ?To learn more about what you can do with MyChart, go to [email or web address].   ? ?Your next appointment:   ?4-6 week(s) ? ?The format for your next appointment:   ?In Person ? ?Provider:   ?Scarlette Slice,  PA   ? ? ?Other Instructions ?Endocarditis Information  ?You may be at risk for developing endocarditis since you have an artificial heart valve or a repaired heart valve. Endocarditis is an infection of the lining of the heart or heart valves. Certain surgical and dental procedures may put you at risk, such as teeth cleaning or other dental procedures or other medical procedures. Notify our office or your dentist before having any dental work or invasive/surgical procedures. You will need to take antibiotics before certain procedures. To prevent endocarditis, maintain good oral health. Seek prompt medical attention for any mouth/gum, skin or urinary tract infections. ? ?Important Information About Sugar ? ? ? ? ?  ? ? ?

## 2021-06-05 LAB — TSH: TSH: 1.2 u[IU]/mL (ref 0.450–4.500)

## 2021-06-05 LAB — CBC
Hematocrit: 43 % (ref 37.5–51.0)
Hemoglobin: 14.9 g/dL (ref 13.0–17.7)
MCH: 31.1 pg (ref 26.6–33.0)
MCHC: 34.7 g/dL (ref 31.5–35.7)
MCV: 90 fL (ref 79–97)
Platelets: 229 10*3/uL (ref 150–450)
RBC: 4.79 x10E6/uL (ref 4.14–5.80)
RDW: 12.1 % (ref 11.6–15.4)
WBC: 12.2 10*3/uL — ABNORMAL HIGH (ref 3.4–10.8)

## 2021-06-05 LAB — COMPREHENSIVE METABOLIC PANEL
ALT: 21 IU/L (ref 0–44)
AST: 20 IU/L (ref 0–40)
Albumin/Globulin Ratio: 1.9 (ref 1.2–2.2)
Albumin: 4.2 g/dL (ref 3.7–4.7)
Alkaline Phosphatase: 55 IU/L (ref 44–121)
BUN/Creatinine Ratio: 11 (ref 10–24)
BUN: 10 mg/dL (ref 8–27)
Bilirubin Total: 1.2 mg/dL (ref 0.0–1.2)
CO2: 24 mmol/L (ref 20–29)
Calcium: 9.4 mg/dL (ref 8.6–10.2)
Chloride: 105 mmol/L (ref 96–106)
Creatinine, Ser: 0.93 mg/dL (ref 0.76–1.27)
Globulin, Total: 2.2 g/dL (ref 1.5–4.5)
Glucose: 107 mg/dL — ABNORMAL HIGH (ref 70–99)
Potassium: 4 mmol/L (ref 3.5–5.2)
Sodium: 143 mmol/L (ref 134–144)
Total Protein: 6.4 g/dL (ref 6.0–8.5)
eGFR: 84 mL/min/{1.73_m2} (ref >59)

## 2021-06-06 ENCOUNTER — Other Ambulatory Visit: Payer: Self-pay

## 2021-06-06 MED ORDER — CARVEDILOL 3.125 MG PO TABS: 3.1250 mg | ORAL_TABLET | Freq: Two times a day (BID) | ORAL | 1 refills | Status: DC

## 2021-06-13 DIAGNOSIS — K219 Gastro-esophageal reflux disease without esophagitis: Secondary | ICD-10-CM | POA: Diagnosis not present

## 2021-06-13 DIAGNOSIS — J4530 Mild persistent asthma, uncomplicated: Secondary | ICD-10-CM | POA: Diagnosis not present

## 2021-06-13 DIAGNOSIS — I10 Essential (primary) hypertension: Secondary | ICD-10-CM | POA: Diagnosis not present

## 2021-06-13 DIAGNOSIS — E7800 Pure hypercholesterolemia, unspecified: Secondary | ICD-10-CM | POA: Diagnosis not present

## 2021-06-17 DIAGNOSIS — D72829 Elevated white blood cell count, unspecified: Secondary | ICD-10-CM | POA: Diagnosis not present

## 2021-06-17 DIAGNOSIS — I10 Essential (primary) hypertension: Secondary | ICD-10-CM | POA: Diagnosis not present

## 2021-06-18 ENCOUNTER — Ambulatory Visit (HOSPITAL_COMMUNITY): Payer: Medicare Other | Attending: Internal Medicine

## 2021-06-18 DIAGNOSIS — I2510 Atherosclerotic heart disease of native coronary artery without angina pectoris: Secondary | ICD-10-CM

## 2021-06-18 DIAGNOSIS — I350 Nonrheumatic aortic (valve) stenosis: Secondary | ICD-10-CM

## 2021-06-18 DIAGNOSIS — Z953 Presence of xenogenic heart valve: Secondary | ICD-10-CM

## 2021-06-18 DIAGNOSIS — I77810 Thoracic aortic ectasia: Secondary | ICD-10-CM

## 2021-06-18 LAB — ECHOCARDIOGRAM COMPLETE
AR max vel: 1.37 cm2
AV Area VTI: 1.5 cm2
AV Area mean vel: 1.37 cm2
AV Mean grad: 14.8 mmHg
AV Peak grad: 26.5 mmHg
Ao pk vel: 2.58 m/s
Area-P 1/2: 2.65 cm2
S' Lateral: 2.6 cm

## 2021-06-19 ENCOUNTER — Telehealth: Payer: Self-pay

## 2021-06-19 DIAGNOSIS — I719 Aortic aneurysm of unspecified site, without rupture: Secondary | ICD-10-CM

## 2021-06-19 NOTE — Telephone Encounter (Signed)
-----   Message from Charlie Pitter, PA-C sent at 06/19/2021  8:37 AM EDT ----- ?Sending to triage also since Darden Dates is rooming, not covering today, was not sure if she is scheduled to call results and this is time sensitive. Please let Pt know that echo overall showed good pump function and normal valve repair but the aorta, blood vessel that comes out of the heart, is much wider than normal. 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 though it?s different than the ones we often think of in the brain. This is not an emergency like those are but does require further workup to determine if he might need to have this repaired surgically at some point in the near future.  ? ?I would recommend to expedite a dedicated CT angio aorta to evaluate this further. Would also place referral to see cardiothoracic surgery after CTA is performed. At recent [place] I recommended close f/u in 4-6 weeks with me, make sure this is arranged. In addition to good BP control, recommend avoiding lifting over 30 LB, avoiding antibiotics in the fluoroquinolone class, and telling first degree relatives to get screened since aneurysms can run in families. Regular mild-moderate physical activity is fine outside the lifting restriction. We can discuss further at his f/u OV. ?

## 2021-06-19 NOTE — Telephone Encounter (Signed)
Called and spoke in detail to patient to review recommendations. Order placed at this time for CT and CTS referral. Pt requests we send copy of ECHO to Dr Wilson Singer will do at this time. No further questions. ?

## 2021-07-02 ENCOUNTER — Other Ambulatory Visit: Payer: Self-pay | Admitting: Physician Assistant

## 2021-07-03 DIAGNOSIS — I10 Essential (primary) hypertension: Secondary | ICD-10-CM | POA: Diagnosis not present

## 2021-07-03 DIAGNOSIS — D72829 Elevated white blood cell count, unspecified: Secondary | ICD-10-CM | POA: Diagnosis not present

## 2021-07-03 DIAGNOSIS — I712 Thoracic aortic aneurysm, without rupture, unspecified: Secondary | ICD-10-CM | POA: Diagnosis not present

## 2021-07-17 ENCOUNTER — Other Ambulatory Visit: Payer: Self-pay | Admitting: Physician Assistant

## 2021-07-19 DIAGNOSIS — E7800 Pure hypercholesterolemia, unspecified: Secondary | ICD-10-CM | POA: Diagnosis not present

## 2021-07-19 DIAGNOSIS — I712 Thoracic aortic aneurysm, without rupture, unspecified: Secondary | ICD-10-CM | POA: Diagnosis not present

## 2021-07-19 DIAGNOSIS — I10 Essential (primary) hypertension: Secondary | ICD-10-CM | POA: Diagnosis not present

## 2021-07-19 DIAGNOSIS — K219 Gastro-esophageal reflux disease without esophagitis: Secondary | ICD-10-CM | POA: Diagnosis not present

## 2021-07-19 DIAGNOSIS — J45998 Other asthma: Secondary | ICD-10-CM | POA: Diagnosis not present

## 2021-07-19 DIAGNOSIS — Z0000 Encounter for general adult medical examination without abnormal findings: Secondary | ICD-10-CM | POA: Diagnosis not present

## 2021-07-24 ENCOUNTER — Telehealth: Payer: Self-pay | Admitting: Physician Assistant

## 2021-07-24 ENCOUNTER — Ambulatory Visit: Payer: Medicare Other | Admitting: Physician Assistant

## 2021-07-24 ENCOUNTER — Encounter: Payer: Self-pay | Admitting: Physician Assistant

## 2021-07-24 ENCOUNTER — Ambulatory Visit (INDEPENDENT_AMBULATORY_CARE_PROVIDER_SITE_OTHER)
Admission: RE | Admit: 2021-07-24 | Discharge: 2021-07-24 | Disposition: A | Discharge status: Home / Self Care | Payer: Medicare Other | Source: Ambulatory Visit | Attending: Cardiovascular Disease | Admitting: Cardiovascular Disease

## 2021-07-24 VITALS — BP 140/80 | HR 84 | Ht 73.0 in | Wt 231.0 lb

## 2021-07-24 DIAGNOSIS — I2510 Atherosclerotic heart disease of native coronary artery without angina pectoris: Secondary | ICD-10-CM

## 2021-07-24 DIAGNOSIS — Z953 Presence of xenogenic heart valve: Secondary | ICD-10-CM

## 2021-07-24 DIAGNOSIS — I350 Nonrheumatic aortic (valve) stenosis: Secondary | ICD-10-CM | POA: Diagnosis not present

## 2021-07-24 DIAGNOSIS — I77819 Aortic ectasia, unspecified site: Secondary | ICD-10-CM

## 2021-07-24 DIAGNOSIS — I10 Essential (primary) hypertension: Secondary | ICD-10-CM | POA: Diagnosis not present

## 2021-07-24 DIAGNOSIS — Z952 Presence of prosthetic heart valve: Secondary | ICD-10-CM | POA: Diagnosis not present

## 2021-07-24 DIAGNOSIS — I719 Aortic aneurysm of unspecified site, without rupture: Secondary | ICD-10-CM | POA: Diagnosis not present

## 2021-07-24 DIAGNOSIS — I517 Cardiomegaly: Secondary | ICD-10-CM | POA: Diagnosis not present

## 2021-07-24 DIAGNOSIS — I700 Atherosclerosis of aorta: Secondary | ICD-10-CM | POA: Diagnosis not present

## 2021-07-24 DIAGNOSIS — I7789 Other specified disorders of arteries and arterioles: Secondary | ICD-10-CM | POA: Diagnosis not present

## 2021-07-24 MED ORDER — IOHEXOL 350 MG/ML SOLN
80.0000 mL | Freq: Once | INTRAVENOUS | Status: AC | PRN
  Administered 2021-07-24: 80 mL via INTRAVENOUS

## 2021-07-24 MED ORDER — LOSARTAN POTASSIUM 50 MG PO TABS: 50.0000 mg | ORAL_TABLET | Freq: Every day | ORAL | 1 refills | Status: DC

## 2021-07-24 NOTE — Patient Instructions (Signed)
  Medication Instructions:  INCREASE Losartan '50mg'$  take 1 tablet once a day (you can take 2 '25mg'$  until you run out) *If you need a refill on your cardiac medications before your next appointment, please call your pharmacy*   Lab Work: None Ordered If you have labs (blood work) drawn today and your tests are completely normal, you will receive your results only by: [place] (if you have MyChart) OR A paper copy in the mail If you have any lab test that is abnormal or we need to change your treatment, we will call you to review the results.   Testing/Procedures: None Ordered   Follow-Up: At [facility], you and your health needs are our priority.  As part of our continuing mission to provide you with exceptional heart care, we have created designated Provider Care Teams.  These Care Teams include your primary Cardiologist (physician) and Advanced Practice Providers (APPs -  Physician Assistants and Nurse Practitioners) who all work together to provide you with the care you need, when you need it.  We recommend signing up for the patient portal called "MyChart".  Sign up information is provided on this After Visit Summary.  MyChart is used to connect with patients for Virtual Visits (Telemedicine).  Patients are able to view lab/test results, encounter notes, upcoming appointments, etc.  Non-urgent messages can be sent to your provider as well.   To learn more about what you can do with MyChart, go to [email or web address].    Your next appointment:   6 month(s)  The format for your next appointment:   In Person  Provider:   Candee Furbish, MD     Other Instructions   Important Information About Sugar

## 2021-07-24 NOTE — Telephone Encounter (Addendum)
At today's visit, pt was open to following up with either our office or PCP for his BP management but did say PCP was easier to get to. Dr. Koleen Nimrod with [facility] recently started losartan and we increased it today. Please call primary care provider to ask if they would be able to see the patient back in 2 weeks for BMET/BP check. If there is any concern that they won't be able to, we are happy to accomodate in our office - would do 2 week BMET and nurse BP visit if that's the case. Thank you!

## 2021-07-24 NOTE — Progress Notes (Addendum)
Cardiology Office Note    Date:  07/24/2021   ID:  Tim Hardy, DOB [birth year], MRN [id number]  PCP:  Tim Orn, MD transitioning to Tim Hardy @ [facility] Cardiologist:  Tim Furbish, MD  Electrophysiologist:  None   Chief Complaint: f/u evaluation of aorta  History of Present Illness:   Tim Hardy is a 81 y.o. 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 Prior studies are outlined below with last cath 2013 with nonobstructive CAD, last nuc was in 2019 and felt to be low risk. I recently met him for overdue follow-up in 06/2021 and echo was repeated showing EF 65-70%, g1DD, low-normal RVSF, severely dilated LA, moderately dilated RA, mild MR, normal structure/function of AVR, but severe dilation of ascending aorta. Carvedilol was started for elevated BP. I had recommended expedited CT of aorta and CVTS consult. CT scan has just been performed today showing no aortic aneurysm or dissection, otherwise just showed aortic atherosclerosis, cardiomegaly, and enlarged PA suggestive of pulm HTN though 2D echo showed normal PASP.  He is seen for follow-up today overall doing well without any acute complaints. Upon receiving his CT results I called Tim Hardy to discuss the discrepancy in the report versus the echo. He agrees with the CT read that aneurysm is not present. He also reviewed the echocardiogram and stated there was artifact that made the true dimension assessment challenging, and the CT is most accurate of the two. In the meantime since last visit, Tim Hardy states his PCP switched his carvedilol to losartan due to his asthma. He confirms he is not on the telmisartan that also showed up on his list - sounds like this was trialed prior to losartan but  had some stomach upset with it. Repeat labs 07/03/21 showed stable K, Cr. [place].   Labwork independently reviewed: KPN 07/2021 LDL 63, trig 161 (PCP),  KPN 07/03/21 K 4.1, Cr 1.040 06/2021 TSH wnl, WBC 12.2 (advised to f/u PCP), Hgb 14.9, plt 229, K 4.0, Cr 0.93, LFTS ok KPN 07/2020 LDL 50, trig 204 11/2019 trig 153, LDL 56 07/2019 K 4, Cr 1.030 10/2018 K 3.9, Cr 1.15, LFTs ok, CBC wnl   Cardiology Studies:   Studies reviewed are outlined and summarized above. Reports included below if pertinent.   2D echo 06/18/21   1. Left ventricular ejection fraction, by estimation, is 65 to 70%. The  left ventricle has normal function. The left ventricle has no regional  wall motion abnormalities. There is mild left ventricular hypertrophy.  Left ventricular diastolic parameters  are consistent with Grade I diastolic dysfunction (impaired relaxation).   2. Right ventricular systolic function is low normal. The right  ventricular size is normal. There is normal pulmonary artery systolic  pressure. The estimated right ventricular systolic pressure is 38.4 mmHg.   3. Left atrial size was severely dilated.   4. Right atrial size was moderately dilated.   5. The mitral valve is abnormal. Mild mitral valve regurgitation.   6. The aortic valve has been repaired/replaced. Aortic valve  regurgitation is trivial. There is a 25 mm Magna valve present in the  aortic position. Echo findings are consistent with normal structure and  function of the aortic valve prosthesis. Aortic  valve area, by  VTI measures 1.50 cm. Aortic valve mean gradient measures  14.8 mmHg. Aortic valve Vmax measures 2.58 m/s.   7. Aortic dilatation noted. There is severe dilatation of the ascending  aorta, measuring 50 mm.   8. The inferior vena cava is normal in size with greater than 50%  respiratory variability, suggesting right atrial pressure of 3 mmHg.   Comparison(s): Changes from  prior study are noted. 05/23/2019: LVEF 60-65%,  AOV mean gradient 13 mmHg.   Conclusion(s)/Recommendation(s): Ascending aorta measured 50 mm - would  recommended dedicated contrast CT angiogram or MRI of the aorta to further  evaluate, as this appears to not have been previously reported.  2D echo 05/2019   1. Left ventricular ejection fraction, by estimation, is 60 to 65%. The  left ventricle has normal function. The left ventricle has no regional  wall motion abnormalities. Left ventricular diastolic parameters are  consistent with Grade I diastolic  dysfunction (impaired relaxation).   2. Right ventricular systolic function is normal. The right ventricular  size is normal. There is normal pulmonary artery systolic pressure.   3. Left atrial size was moderately dilated.   4. Right atrial size was mildly dilated.   5. The mitral valve is normal in structure. Trivial mitral valve  regurgitation. No evidence of mitral stenosis.   6. Post bioprosthetic AVR 2013 no PVL/AR gradients stable since echo done  March 2019 . The aortic valve has been repaired/replaced. Aortic valve  regurgitation is not visualized.   7. Aortic dilatation noted. There is mild dilatation of the aortic root  measuring 39 mm.   8. The inferior vena cava is normal in size with greater than 50%  respiratory variability, suggesting right atrial pressure of 3 mmHg   Nuc 10/2017   1. Left ventricular ejection fraction, by estimation, is 60 to 65%. The  left ventricle has normal function. The left ventricle has no regional  wall motion abnormalities. Left ventricular diastolic parameters are  consistent with Grade I diastolic  dysfunction (impaired relaxation).   2. Right ventricular systolic function is normal. The right ventricular  size is normal. There is normal pulmonary artery systolic pressure.   3. Left atrial size was moderately dilated.   4. Right atrial size was mildly dilated.   5. The mitral valve is  normal in structure. Trivial mitral valve  regurgitation. No evidence of mitral stenosis.   6. Post bioprosthetic AVR 2013 no PVL/AR gradients stable since echo done  March 2019 . The aortic valve has been repaired/replaced. Aortic valve  regurgitation is not visualized.   7. Aortic dilatation noted. There is mild dilatation of the aortic root  measuring 39 mm.   8. The inferior vena cava is normal in size with greater than 50%  respiratory variability, suggesting right atrial pressure of 3 mmHg   2013 ANGIOGRAPHIC DATA:     Left main: No angiographically significant coronary artery disease branches into LAD and circumflex artery   Left anterior descending (LAD): There is mid LAD calcification at the bifurcation of a mid diagonal branch. Mild stenosis of approximately 30-40% noted in that region. No flow limiting disease present. The LAD then continues to wrap around the apex. There are 5 relatively small caliber diagonal branches. The first 2 proximal branches demonstrate minor ostial disease.   Circumflex artery (CIRC): There are 3 obtuse marginal branches. No angiographically significant disease present.   Right coronary artery (RCA): Dominant vessel giving rise to the posterior descending artery. No angiographically significant disease.  Ascending aortogram/descending aortogram: Performed in the LAO position. There is no evidence of coarctation of the aorta. There is no evidence of abdominal aortic aneurysm. Common iliacs are widely patent. Common femoral arteries are widely patent. Minor aortic sclerosis in the ascending aorta noted. Minor calcification of the left subclavian proximal artery noted. Severe calcification of the aortic valve with limited cusp excursion. IMPRESSIONS:   Minor mid LAD 30-40% stenosis/calcified with otherwise no angiographically significant disease present. No flow limiting disease. No angiographic evidence of coarctation/abdominal aortic aneurysm. Normal  right-sided heart pressures. Normal cardiac output. 5.  Severely calcified aortic valve. Echocardiogram demonstrates severe aortic stenosis.   RECOMMENDATION:  I have put in referral for cardiothoracic surgery for aortic valve replacement in the setting of symptomatic severe aortic stenosis. Once again, I cannot exclude bicuspid aortic valve based upon transthoracic echocardiogram. There does not appear to be evidence of coarctation. If transesophageal echocardiogram is needed prior to surgery, I will be happy to perform at the request of surgery. Explained findings to patient.     Past Medical History:  Diagnosis Date   Asthma    Basal cell adenocarcinoma    BPH (benign prostatic hyperplasia)    Bronchitis    Cancer ([place])    skin cancer removed in August   Colon polyps    Colon, diverticulosis    Diastolic dysfunction    Dilated aortic root ([facility])    ED (erectile dysfunction)    GERD (gastroesophageal reflux disease)    Heart murmur    Hiatal hernia    History of aortic valve replacement with bioprosthetic valve    Incarcerated umbilical hernia [id number]/8295   Mild CAD    Perennial allergic rhinitis    Shortness of breath    Thoracic ascending aortic aneurysm [facility])     Past Surgical History:  Procedure Laterality Date   AORTIC VALVE REPLACEMENT  11/03/2011   Procedure: AORTIC VALVE REPLACEMENT (AVR);  Surgeon: Melrose Nakayama, MD;  Location: [place];  Service: Open Heart Surgery;  Laterality: N/A;  Partial Sternotomy   APPENDECTOMY     CARDIAC CATHETERIZATION     COLONOSCOPY W/ POLYPECTOMY     COLONOSCOPY WITH PROPOFOL N/A 07/11/2014   Procedure: COLONOSCOPY WITH PROPOFOL;  Surgeon: Garlan Fair, MD;  Location: [facility];  Service: Endoscopy;  Laterality: N/A;   ESOPHAGOGASTRODUODENOSCOPY (EGD) WITH PROPOFOL N/A 07/11/2014   Procedure: ESOPHAGOGASTRODUODENOSCOPY (EGD) WITH PROPOFOL;  Surgeon: Garlan Fair, MD;  Location: [facility];  Service: Endoscopy;  Laterality:  N/A;   FEMORAL HERNIA REPAIR     , x5   HERNIA REPAIR Bilateral    KNEE ARTHROSCOPY Bilateral    SHOULDER ARTHROSCOPY Right    STERNOTOMY  11/03/2011   Procedure: STERNOTOMY;  Surgeon: Melrose Nakayama, MD;  Location: [place];  Service: Open Heart Surgery;  Laterality: N/A;  Partial   TONSILLECTOMY     UMBILICAL HERNIA REPAIR N/A 10/20/2018   Procedure: OPEN REPAIR UMBILICAL HERNIA WITH  MESH;  Surgeon: Fanny Skates, MD;  Location: [place];  Service: General;  Laterality: N/A;    Current Medications: Current Meds  Medication Sig   acetaminophen (TYLENOL) 500 MG tablet Take 500 mg by mouth every 6 (six) hours as needed.   ALPRAZolam (XANAX) 0.5 MG tablet Take 0.25-0.5 mg by mouth 2 (two) times daily as needed.   aspirin EC 81 MG tablet Take 81 mg by mouth every morning.    atorvastatin (LIPITOR) 10 MG tablet Take 5 mg by mouth  daily.   azithromycin (ZITHROMAX) 250 MG tablet Take 2 tabs by mouth 30-60 minutes prior to dental work   carvedilol (COREG) 3.125 MG tablet TAKE 1 TABLET BY MOUTH TWICE A DAY   cetirizine (ZYRTEC) 10 MG tablet Take 10 mg by mouth as needed.   Cholecalciferol (VITAMIN D PO) Take 1 tablet by mouth every morning. Per patient taking 1,000 mcg   Cyanocobalamin (B-12 PO) Take 1 tablet by mouth every morning.    fluticasone (FLONASE) 50 MCG/ACT nasal spray Place 2 sprays into the nose daily as needed for allergies.    fluticasone (FLOVENT DISKUS) 50 MCG/BLIST diskus inhaler Inhale 1 puff into the lungs 2 (two) times daily.   HYDROcodone-acetaminophen (NORCO) 5-325 MG tablet Take 1-2 tablets by mouth every 6 (six) hours as needed for moderate pain or severe pain.   losartan (COZAAR) 25 MG tablet Take 1 tablet by mouth daily.   Multiple Vitamin (MULTIVITAMIN WITH MINERALS) TABS Take 1 tablet by mouth every morning.    omeprazole (PRILOSEC) 10 MG capsule Take 1 capsule by mouth daily.   Tamsulosin HCl (FLOMAX) 0.4 MG CAPS Take 0.4 mg by mouth daily  after supper.       Allergies:   Penicillins   Social History   Socioeconomic History   Marital status: Widowed    Spouse name: Not on file   Number of children: Not on file   Years of education: Not on file   Highest education level: Not on file  Occupational History   Occupation: retired  Tobacco Use   Smoking status: Former    Types: Cigarettes    Start date: 02/04/1971    Quit date: 02/03/1972    Years since quitting: 49.5   Smokeless tobacco: Current    Types: Chew  Vaping Use   Vaping Use: Never used  Substance and Sexual Activity   Alcohol use: Yes    Comment: rare   Drug use: No   Sexual activity: Not on file  Other Topics Concern   Not on file  Social History Narrative   Retired Chief Financial Officer at [employer]    He is a Physicist, medical and enjoys renovation log cabins     Social Determinants of Radio broadcast assistant Strain: Not on file  Food Insecurity: Not on file  Transportation Needs: Not on file  Physical Activity: Not on file  Stress: Not on file  Social Connections: Not on file     Family History:  The patient's family history includes Cancer in his father; Diabetes in his sister and son; Heart attack in his mother; Hypertension in his father.  ROS:   Please see the history of present illness.  All other systems are reviewed and otherwise negative.    EKG(s)/Additional Labs   EKG:  EKG is not ordered today  Recent Labs: 06/04/2021: ALT 21; BUN 10; Creatinine, Ser 0.93; Hemoglobin 14.9; Platelets 229; Potassium 4.0; Sodium 143; TSH 1.200  Recent Lipid Panel    Component Value Date/Time   CHOL 115 11/09/2019 0818   TRIG 153 (H) 11/09/2019 0818   HDL 32 (L) 11/09/2019 0818   CHOLHDL 3.6 11/09/2019 0818   CHOLHDL 3.9 10/15/2015 0811   VLDL 28 10/15/2015 0811   LDLCALC 57 11/09/2019 0818    PHYSICAL EXAM:    VS:  BP 130/84   Pulse 84   Ht _0  (1.854 m)   Wt 231 lb (104.8 kg)   SpO2 95%   BMI 30.48 kg/m  BMI: Body mass index is  30.48 kg/m.  GEN: Well nourished, well developed male in no acute distress HEENT: normocephalic, atraumatic Neck: no JVD, carotid bruits, or masses Cardiac: RRR; crisp valve click without pathologic murmurs, rubs, or gallops, no edema  Respiratory:  clear to auscultation bilaterally, normal work of breathing GI: soft, nontender, nondistended, + BS MS: no deformity or atrophy Skin: warm and dry, no rash Neuro:  Alert and Oriented x 3, Strength and sensation are intact, follows commands Psych: euthymic mood, full affect  Wt Readings from Last 3 Encounters:  07/24/21 231 lb (104.8 kg)  06/04/21 235 lb 3.2 oz (106.7 kg)  11/23/20 236 lb (107 kg)     ASSESSMENT & PLAN:   1. Dilated aorta - echo suggested severely dilated aorta prompting CT and CVTS referral, but CT amazingly shows no aortic abnormality. This study did show enlarged PA suggestive of pulmonary HTN but echo did not show any evidence of such. I reviewed his studies with Tim Hardy who feels the echo finding may have been related to artifact, and that the CT is the most accurate of the two regarding aortic size. Therefore, we do not need to pursue CVTS referral. We cancelled his OV and I also sent msg to Levonne Spiller, CT surgery coordinator to let her know to cancel the referral. Originally we started the carvedilol both from BP/aorta standpoint but since aorta size is normal by CT, it is less imperative that he be on a beta blocker - so I am fine with the switch to losartan. Otherwise see below.  2. Severe AS s/p pericardial AVR - stable by recent echo. SBE prophylaxis reviewed at last visit and rx sent in on file. PCN allergic so has azithromycin rx.  3. Nonobstructive CAD - no recent anginal symptoms. Continue ASA. Statin/lipids are managed by PCP, LDL <70 by recent labs this month.   4. Essential HTN - BP remains suboptimally controlled, recheck by me 140/80. He reports home readings [id number] systolic. See above regarding recent  medicine changes. Will increase losartan to 65m daily. I asked him whether he would like [facility] follow this or primary care. He stated he is fine with either but that primary care is closer. Will request MA contact primary care to see if they would be willing to see the patient back for f/u BMET and BP check in 2 weeks. If there are any hiccups we can certainly arrange follow-up back here, happy to help.    Disposition: F/u with Dr. SMarlou Porchin 6 months.   Medication Adjustments/Labs and Tests Ordered: Current medicines are reviewed at length with the patient today.  Concerns regarding medicines are outlined above. Medication changes, Labs and Tests ordered today are summarized above and listed in the Patient Instructions accessible in Encounters.   Signed, DCharlie Pitter PA-C  07/24/2021 4:29 PM    CHannaPhone: [phone] Fax: [phone]

## 2021-08-02 DIAGNOSIS — I10 Essential (primary) hypertension: Secondary | ICD-10-CM | POA: Diagnosis not present

## 2021-08-12 DIAGNOSIS — I10 Essential (primary) hypertension: Secondary | ICD-10-CM | POA: Diagnosis not present

## 2021-08-14 ENCOUNTER — Encounter: Payer: Medicare Other | Admitting: Surgery

## 2021-08-21 DIAGNOSIS — R933 Abnormal findings on diagnostic imaging of other parts of digestive tract: Secondary | ICD-10-CM | POA: Diagnosis not present

## 2021-09-05 ENCOUNTER — Encounter: Payer: Self-pay | Admitting: Cardiology

## 2021-09-05 ENCOUNTER — Ambulatory Visit: Payer: Medicare Other | Admitting: Cardiology

## 2021-09-05 VITALS — BP 130/80 | HR 77 | Ht 73.0 in | Wt 229.0 lb

## 2021-09-05 DIAGNOSIS — Z953 Presence of xenogenic heart valve: Secondary | ICD-10-CM

## 2021-09-05 DIAGNOSIS — I2510 Atherosclerotic heart disease of native coronary artery without angina pectoris: Secondary | ICD-10-CM | POA: Diagnosis not present

## 2021-09-05 NOTE — Progress Notes (Signed)
Cardiology Office Note:    Date:  09/05/2021   ID:  Tim Hardy, DOB Mar 30, 1940, MRN [id number]  PCP:  Lavone Orn, MD  [facility] Cardiologist:  Candee Furbish, MD  [facility] Electrophysiologist:  None   Referring MD: Lavone Orn, MD    History of Present Illness:    Tim Hardy is a 81 y.o. male here for the follow-up of coronary artery disease and aortic stenosis.  Aortic valve replacement occurred in September 2013 had nonobstructive CAD on cardiac catheterization with 30 to 40% LAD.  I used to take care of his mother as well.  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  He has alpacas now as well.  Had hernia repair by Dr. Dalbert Batman in 2020.  At his last appointment, he was feeling pretty good. He stated he may "overdo it sometimes", such as when using a chainsaw to clean debris or trim trees and hedges. His main complaint at that time was arthritic pain in his hips and knees.  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  Today:  He states he is doing pretty good. However, he was concerned about his prior echo revealing a 50 mm aneurysm however, when we checked a CT scan of his aorta, it was normal diameter.  This was artifact on echocardiogram.  He does not have an aortic aneurysm.. We reviewed the results of his recent echo and CT tests in detail, which were reassuring.  He was started on losartan in May 2023. This had been increased to 50 mg in June 2023, but he developed side effects of GI upset. Currently he is taking 25 mg of losartan daily which he seems to be tolerating well. He is cutting his 50 mg tablets in half. He states his blood pressure is doing well at home.   He denies any palpitations, chest pain, shortness of breath, or peripheral edema. No lightheadedness, headaches, syncope, orthopnea, or PND.    Past Medical History:  Diagnosis Date   Asthma    Basal cell adenocarcinoma    BPH (benign  prostatic hyperplasia)    Bronchitis    Cancer ([place])    skin cancer removed in August   Colon polyps    Colon, diverticulosis    Diastolic dysfunction    Dilated aortic root (HCC)    ED (erectile dysfunction)    GERD (gastroesophageal reflux disease)    Heart murmur    Hiatal hernia    History of aortic valve replacement with bioprosthetic valve    Incarcerated umbilical hernia 40/98/1191   Mild CAD    Perennial allergic rhinitis    Shortness of breath    Thoracic ascending aortic aneurysm [facility])     Past Surgical History:  Procedure Laterality Date   AORTIC VALVE REPLACEMENT  11/03/2011   Procedure: AORTIC VALVE REPLACEMENT (AVR);  Surgeon: Melrose Nakayama, MD;  Location: [place];  Service: Open Heart Surgery;  Laterality: N/A;  Partial Sternotomy   APPENDECTOMY     CARDIAC CATHETERIZATION     COLONOSCOPY W/ POLYPECTOMY     COLONOSCOPY WITH PROPOFOL N/A 07/11/2014   Procedure: COLONOSCOPY WITH PROPOFOL;  Surgeon: Garlan Fair, MD;  Location: [facility];  Service: Endoscopy;  Laterality: N/A;   ESOPHAGOGASTRODUODENOSCOPY (EGD) WITH PROPOFOL N/A 07/11/2014   Procedure: ESOPHAGOGASTRODUODENOSCOPY (EGD) WITH PROPOFOL;  Surgeon: Garlan Fair, MD;  Location: [facility];  Service: Endoscopy;  Laterality: N/A;   FEMORAL HERNIA  REPAIR     , x5   HERNIA REPAIR Bilateral    KNEE ARTHROSCOPY Bilateral    SHOULDER ARTHROSCOPY Right    STERNOTOMY  11/03/2011   Procedure: STERNOTOMY;  Surgeon: Melrose Nakayama, MD;  Location: [place];  Service: Open Heart Surgery;  Laterality: N/A;  Partial   TONSILLECTOMY     UMBILICAL HERNIA REPAIR N/A 10/20/2018   Procedure: OPEN REPAIR UMBILICAL HERNIA WITH  MESH;  Surgeon: Fanny Skates, MD;  Location: [place];  Service: General;  Laterality: N/A;    Current Medications: Current Meds  Medication Sig   acetaminophen (TYLENOL) 500 MG tablet Take 500 mg by mouth every 6 (six) hours as needed.   ALPRAZolam (XANAX) 0.5 MG  tablet Take 0.25-0.5 mg by mouth 2 (two) times daily as needed.   aspirin EC 81 MG tablet Take 81 mg by mouth every morning.    atorvastatin (LIPITOR) 10 MG tablet Take 5 mg by mouth daily.   azithromycin (ZITHROMAX) 250 MG tablet Take 2 tabs by mouth 30-60 minutes prior to dental work   cetirizine (ZYRTEC) 10 MG tablet Take 10 mg by mouth as needed.   Cholecalciferol (VITAMIN D PO) Take 1 tablet by mouth every morning. Per patient taking 1,000 mcg   Cyanocobalamin (B-12 PO) Take 1 tablet by mouth every morning.    fluticasone (FLONASE) 50 MCG/ACT nasal spray Place 2 sprays into the nose daily as needed for allergies.    fluticasone (FLOVENT DISKUS) 50 MCG/BLIST diskus inhaler Inhale 1 puff into the lungs 2 (two) times daily.   HYDROcodone-acetaminophen (NORCO) 5-325 MG tablet Take 1-2 tablets by mouth every 6 (six) hours as needed for moderate pain or severe pain.   losartan (COZAAR) 50 MG tablet Take 1 tablet (50 mg total) by mouth daily. (Patient taking differently: Take 37.5 mg by mouth daily.)   Multiple Vitamin (MULTIVITAMIN WITH MINERALS) TABS Take 1 tablet by mouth every morning.    omeprazole (PRILOSEC) 10 MG capsule Take 1 capsule by mouth daily.   Tamsulosin HCl (FLOMAX) 0.4 MG CAPS Take 0.4 mg by mouth daily after supper.      Allergies:   Penicillins   Social History   Socioeconomic History   Marital status: Widowed    Spouse name: Not on file   Number of children: Not on file   Years of education: Not on file   Highest education level: Not on file  Occupational History   Occupation: retired  Tobacco Use   Smoking status: Former    Types: Cigarettes    Start date: 02/04/1971    Quit date: 02/03/1972    Years since quitting: 49.6   Smokeless tobacco: Current    Types: Chew  Vaping Use   Vaping Use: Never used  Substance and Sexual Activity   Alcohol use: Yes    Comment: rare   Drug use: No   Sexual activity: Not on file  Other Topics Concern   Not on file   Social History Narrative   Retired Chief Financial Officer at [employer]    He is a Physicist, medical and enjoys renovation log cabins     Social Determinants of Radio broadcast assistant Strain: Not on file  Food Insecurity: Not on file  Transportation Needs: Not on file  Physical Activity: Not on file  Stress: Not on file  Social Connections: Not on file     Family History: The patient's family history includes Cancer in his father; Diabetes in his  sister and son; Heart attack in his mother; Hypertension in his father.  ROS:   Please see the history of present illness.    All other systems reviewed and are negative.  EKGs/Labs/Other Studies Reviewed:    The following studies were reviewed today:  CTA Chest Aorta 07/24/21: FINDINGS: Cardiovascular: Preferential opacification of the thoracic aorta. No evidence of thoracic aortic aneurysm or dissection. Enlarged heart size. No significant pericardial effusion. At least moderate atherosclerotic plaque of the thoracic aorta. Status post aortic valve replacement. Left anterior descending coronary artery stent. The left and right pulmonary arteries are enlarged in size with limited evaluation for pulmonary embolus due to timing of contrast.   Mediastinum/Nodes: No enlarged mediastinal, hilar, or axillary lymph nodes. Thyroid gland, trachea, and esophagus demonstrate no significant findings.   Lungs/Pleura: Right lower lobe linear atelectasis versus scarring. No focal consolidation. No pulmonary nodule. No pulmonary mass. No pleural effusion. No pneumothorax.   Upper Abdomen: No acute abnormality.   Musculoskeletal:   No chest wall abnormality. Fat density lesion within the subscapularis muscle likely intra muscular lipomatous lesion measuring of to 1.4 x 1.1 x 2.6cm.   No suspicious lytic or blastic osseous lesions. No acute displaced fracture. Chronic appearing anterior wedge deformity of the T12 vertebral body with at least 40%  height loss.   Review of the MIP images confirms the above findings.   IMPRESSION: 1. No acute aortic abnormality in a patient status post aortic valve replacement and left anterior descending coronary artery stent. Aortic Atherosclerosis (ICD10-I70.0). 2. Cardiomegaly. 3. Enlarged bilateral main pulmonary artery suggestive of pulmonary hypertension. 4. No acute intrathoracic abnormality.  Echocardiogram 06/18/21: 1. Left ventricular ejection fraction, by estimation, is 65 to 70%. The  left ventricle has normal function. The left ventricle has no regional  wall motion abnormalities. There is mild left ventricular hypertrophy.  Left ventricular diastolic parameters  are consistent with Grade I diastolic dysfunction (impaired relaxation).   2. Right ventricular systolic function is low normal. The right  ventricular size is normal. There is normal pulmonary artery systolic  pressure. The estimated right ventricular systolic pressure is 56.2 mmHg.   3. Left atrial size was severely dilated.   4. Right atrial size was moderately dilated.   5. The mitral valve is abnormal. Mild mitral valve regurgitation.   6. The aortic valve has been repaired/replaced. Aortic valve  regurgitation is trivial. There is a 25 mm Magna valve present in the  aortic position. Echo findings are consistent with normal structure and  function of the aortic valve prosthesis. Aortic  valve area, by VTI measures 1.50 cm. Aortic valve mean gradient measures  14.8 mmHg. Aortic valve Vmax measures 2.58 m/s.   7. Aortic dilatation noted. There is severe dilatation of the ascending  aorta, measuring 50 mm.   8. The inferior vena cava is normal in size with greater than 50%  respiratory variability, suggesting right atrial pressure of 3 mmHg.   Comparison(s): Changes from prior study are noted. 05/23/2019: LVEF 60-65%,  AOV mean gradient 13 mmHg.   Conclusion(s)/Recommendation(s): Ascending aorta measured 50 mm -  would  recommended dedicated contrast CT angiogram or MRI of the aorta to further  evaluate, as this appears to not have been previously reported.   ECHO 05/23/19:  1. Left ventricular ejection fraction, by estimation, is 60 to 65%. The  left ventricle has normal function. The left ventricle has no regional  wall motion abnormalities. Left ventricular diastolic parameters are  consistent with Grade I  diastolic  dysfunction (impaired relaxation).   2. Right ventricular systolic function is normal. The right ventricular  size is normal. There is normal pulmonary artery systolic pressure.   3. Left atrial size was moderately dilated.   4. Right atrial size was mildly dilated.   5. The mitral valve is normal in structure. Trivial mitral valve  regurgitation. No evidence of mitral stenosis.   6. Post bioprosthetic AVR 2013 no PVL/AR gradients stable since echo done  March 2019 . The aortic valve has been repaired/replaced. Aortic valve  regurgitation is not visualized.   7. Aortic dilatation noted. There is mild dilatation of the aortic root  measuring 39 mm.   8. The inferior vena cava is normal in size with greater than 50%  respiratory variability, suggesting right atrial pressure of 3 mmHg.  Gradient 13 mmHg.  Exercise Stress Test 10/16/2017: There was no ST segment deviation noted during stress. This is a low risk study.   1. No evidence for ischemia by ST segment analysis, but there were PVCs pre-test, during stress, and during recovery.  2. Study was not gated.  3. Fixed small, mild mid-apical inferior perfusion defect.  No evidence for ischemia.  Possible diaphragmatic attenuation, cannot rule out prior small infarction as cannot assess wall motion.    Low risk study.   EKG: EKG is personally reviewed.  09/05/21: EKG was not ordered.  11/23/2020: Sinus rhythm. Rate 78 bpm. Left anterior fascicular block. 12/13/2019: sinus rhythm 83 nonspecific interventricular conduction  delay.  Recent Labs: 06/04/2021: ALT 21; BUN 10; Creatinine, Ser 0.93; Hemoglobin 14.9; Platelets 229; Potassium 4.0; Sodium 143; TSH 1.200   Recent Lipid Panel    Component Value Date/Time   CHOL 115 11/09/2019 0818   TRIG 153 (H) 11/09/2019 0818   HDL 32 (L) 11/09/2019 0818   CHOLHDL 3.6 11/09/2019 0818   CHOLHDL 3.9 10/15/2015 0811   VLDL 28 10/15/2015 0811   LDLCALC 57 11/09/2019 0818     Physical Exam:     VS:  BP 130/80 (BP Location: Left Arm, Patient Position: Sitting, Cuff Size: Normal)   Pulse 77   Ht '6\' 1"'$  (1.854 m)   Wt 229 lb (103.9 kg)   SpO2 96%   BMI 30.21 kg/m     Wt Readings from Last 3 Encounters:  09/05/21 229 lb (103.9 kg)  07/24/21 231 lb (104.8 kg)  06/04/21 235 lb 3.2 oz (106.7 kg)     GEN: Well nourished, well developed in no acute distress HEENT: Normal NECK: No JVD; No carotid bruits LYMPHATICS: No lymphadenopathy CARDIAC: RRR, no murmurs, rubs, gallops RESPIRATORY:  Clear to auscultation without rales, wheezing or rhonchi  ABDOMEN: Soft, non-tender, non-distended MUSCULOSKELETAL:  No edema; No deformity  SKIN: Warm and dry NEUROLOGIC:  Alert and oriented x 3 PSYCHIATRIC:  Normal affect    ASSESSMENT:    1. History of aortic valve replacement with bioprosthetic valve   2. Mild CAD      PLAN:    In order of problems listed above:  Aortic valve replacement secondary to severe aortic stenosis - Doing well.  Echocardiogram reviewed.  Dental prophylaxis, aspirin 81 mg.  Note, he does not have aortic aneurysm.  50 mm was artifact measurement from echocardiogram.  CT scan verified normal diameter.  Hypertension - On losartan, he does break his pill in half and quarters, 37.5 mg.  Hyperlipidemia - Continue with low-dose atorvastatin 5 mg a day.  63 LDL.  Excellent.  Creatinine 1.0 hemoglobin A1c 5.7  TSH 1.2  Coronary artery disease - Mild nonobstructive disease of LAD seen on catheterization in 2013 prior to his aortic valve  surgery.  Continue with statin, aspirin.   Shared Decision Making/Informed Consent     Follow-up:  6 months.  Medication Adjustments/Labs and Tests Ordered: Current medicines are reviewed at length with the patient today.  Concerns regarding medicines are outlined above.   No orders of the defined types were placed in this encounter.   No orders of the defined types were placed in this encounter.   Patient Instructions  Medication Instructions:  The current medical regimen is effective;  continue present plan and medications.  *If you need a refill on your cardiac medications before your next appointment, please call your pharmacy*  Follow-Up: At [facility], you and your health needs are our priority.  As part of our continuing mission to provide you with exceptional heart care, we have created designated Provider Care Teams.  These Care Teams include your primary Cardiologist (physician) and Advanced Practice Providers (APPs -  Physician Assistants and Nurse Practitioners) who all work together to provide you with the care you need, when you need it.  We recommend signing up for the patient portal called "MyChart".  Sign up information is provided on this After Visit Summary.  MyChart is used to connect with patients for Virtual Visits (Telemedicine).  Patients are able to view lab/test results, encounter notes, upcoming appointments, etc.  Non-urgent messages can be sent to your provider as well.   To learn more about what you can do with MyChart, go to [email or web address].    Your next appointment:   6 month(s)  The format for your next appointment:   In Person  Provider:   Candee Furbish, MD {   Important Information About Sugar          I,Breanna Adamick,acting as a scribe for Candee Furbish, MD.,have documented all relevant documentation on the behalf of Candee Furbish, MD,as directed by  Candee Furbish, MD while in the presence of Candee Furbish, MD.   I, Candee Furbish,  MD, have reviewed all documentation for this visit. The documentation on 09/05/21 for the exam, diagnosis, procedures, and orders are all accurate and complete.   Signed, Candee Furbish, MD  09/05/2021 11:17 AM    [place]

## 2021-09-05 NOTE — Patient Instructions (Signed)
Medication Instructions:  The current medical regimen is effective;  continue present plan and medications.  *If you need a refill on your cardiac medications before your next appointment, please call your pharmacy*  Follow-Up: At [facility], you and your health needs are our priority.  As part of our continuing mission to provide you with exceptional heart care, we have created designated Provider Care Teams.  These Care Teams include your primary Cardiologist (physician) and Advanced Practice Providers (APPs -  Physician Assistants and Nurse Practitioners) who all work together to provide you with the care you need, when you need it.  We recommend signing up for the patient portal called "MyChart".  Sign up information is provided on this After Visit Summary.  MyChart is used to connect with patients for Virtual Visits (Telemedicine).  Patients are able to view lab/test results, encounter notes, upcoming appointments, etc.  Non-urgent messages can be sent to your provider as well.   To learn more about what you can do with MyChart, go to [email or web address].    Your next appointment:   6 month(s)  The format for your next appointment:   In Person  Provider:   Candee Furbish, MD {   Important Information About Sugar

## 2021-09-26 DIAGNOSIS — N320 Bladder-neck obstruction: Secondary | ICD-10-CM | POA: Diagnosis not present

## 2021-10-16 ENCOUNTER — Telehealth: Payer: Self-pay | Admitting: Radiation Oncology

## 2021-10-16 ENCOUNTER — Ambulatory Visit
Admission: RE | Admit: 2021-10-16 | Discharge: 2021-10-16 | Disposition: A | Discharge status: Home / Self Care | Payer: Self-pay | Source: Ambulatory Visit | Attending: Radiation Oncology | Admitting: Radiation Oncology

## 2021-10-16 ENCOUNTER — Other Ambulatory Visit: Payer: Self-pay | Admitting: Radiation Oncology

## 2021-10-16 DIAGNOSIS — C61 Malignant neoplasm of prostate: Secondary | ICD-10-CM

## 2021-10-16 NOTE — Telephone Encounter (Signed)
9/13 @ 3:29 pm Left voicemail for patient to call our office to be schedule for consult with Dr. Tammi Klippel.

## 2021-10-23 DIAGNOSIS — D225 Melanocytic nevi of trunk: Secondary | ICD-10-CM | POA: Diagnosis not present

## 2021-10-23 DIAGNOSIS — Z09 Encounter for follow-up examination after completed treatment for conditions other than malignant neoplasm: Secondary | ICD-10-CM | POA: Diagnosis not present

## 2021-10-23 DIAGNOSIS — Z08 Encounter for follow-up examination after completed treatment for malignant neoplasm: Secondary | ICD-10-CM | POA: Diagnosis not present

## 2021-10-23 DIAGNOSIS — Z85828 Personal history of other malignant neoplasm of skin: Secondary | ICD-10-CM | POA: Diagnosis not present

## 2021-10-23 DIAGNOSIS — Z86007 Personal history of in-situ neoplasm of skin: Secondary | ICD-10-CM | POA: Diagnosis not present

## 2021-10-23 DIAGNOSIS — L570 Actinic keratosis: Secondary | ICD-10-CM | POA: Diagnosis not present

## 2021-10-23 DIAGNOSIS — L821 Other seborrheic keratosis: Secondary | ICD-10-CM | POA: Diagnosis not present

## 2021-10-23 DIAGNOSIS — L814 Other melanin hyperpigmentation: Secondary | ICD-10-CM | POA: Diagnosis not present

## 2021-10-28 NOTE — Progress Notes (Signed)
GU Location of Tumor / Histology: Prostate Ca  If Prostate Cancer, Gleason Score is (4 + 3) and PSA is (15.22 10/25/2020).  Biopsies: Dr. Rosana Hoes        Final Pathologic Diagnosis   A. PROSTATE, RIGHT ANTERIOR BASE, BIOPSY:  Benign prostatic tissue.  B. PROSTATE, LEFT ANTERIOR BASE, BIOPSY:  Benign prostatic tissue.  C. 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  D. PROSTATE, LEFT POSTERIOR BASE, BIOPSY:  Benign prostatic tissue.  E. 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  F. PROSTATE, LEFT ANTERIOR APEX, BIOPSY:  Prostatic adenocarcinoma, Gleason score 3+4 =7, grade group 2 (pattern 4 = 40%), involving 90%, 10% of two of three cores.  G. PROSTATE, RIGHT POSTERIOR APEX, BIOPSY:  Benign prostatic tissue.  H. PROSTATE, LEFT POSTERIOR APEX, BIOPSY:  Benign prostatic tissue.   Past/Anticipated interventions by urology, if any:   09/26/2021 Dr. Rosana Hoes Plan: Diagnoses and all orders for this visit:  Malignant neoplasm of prostate [facility])  Bladder neck obstruction  Elevated prostate specific antigen (PSA)  Impotence of organic origin  Risk Assessment:  We discussed Wisam's situation with him and Ms. Satterly. We feel that short-term ADT plus external beam radiation is excellent treatment for this and I think it is probably best to his situation. He is very well informed and agrees. He would like to have it done regionally, so we will set him up to see Dr. Tyler Pita here in [place]. If he need fiducial seed markers or Lupron Dr. Tammi Klippel will touch base with [place] and we will certainly handle that. They were well informed and wished to proceed accordingly.  Return in about 6 months (around 03/29/2022).   Past/Anticipated  interventions by medical oncology, if any: NA  Weight changes, if any: {:18581}  IPPS: SHIM:  No sexual activity  Bowel/Bladder complaints, if any: {:18581}   Nausea/Vomiting, if any: {:18581}  Pain issues, if any:  {:18581}  SAFETY ISSUES: Prior radiation? {:18581} Pacemaker/ICD? {:18581} Possible current pregnancy? Male Is the patient on methotrexate? No  Current Complaints / other details:

## 2021-10-30 ENCOUNTER — Ambulatory Visit
Admission: RE | Admit: 2021-10-30 | Discharge: 2021-10-30 | Disposition: A | Discharge status: Home / Self Care | Payer: Medicare Other | Source: Ambulatory Visit | Attending: Radiation Oncology | Admitting: Radiation Oncology

## 2021-10-30 VITALS — BP 140/64 | HR 73 | Temp 98.0°F | Resp 20 | Ht 73.0 in | Wt 232.2 lb

## 2021-10-30 DIAGNOSIS — Z800 Family history of malignant neoplasm of digestive organs: Secondary | ICD-10-CM | POA: Diagnosis not present

## 2021-10-30 DIAGNOSIS — N400 Enlarged prostate without lower urinary tract symptoms: Secondary | ICD-10-CM | POA: Diagnosis not present

## 2021-10-30 DIAGNOSIS — K449 Diaphragmatic hernia without obstruction or gangrene: Secondary | ICD-10-CM | POA: Diagnosis not present

## 2021-10-30 DIAGNOSIS — Z79899 Other long term (current) drug therapy: Secondary | ICD-10-CM | POA: Insufficient documentation

## 2021-10-30 DIAGNOSIS — Z85828 Personal history of other malignant neoplasm of skin: Secondary | ICD-10-CM | POA: Insufficient documentation

## 2021-10-30 DIAGNOSIS — R011 Cardiac murmur, unspecified: Secondary | ICD-10-CM | POA: Diagnosis not present

## 2021-10-30 DIAGNOSIS — C61 Malignant neoplasm of prostate: Secondary | ICD-10-CM | POA: Insufficient documentation

## 2021-10-30 DIAGNOSIS — Z191 Hormone sensitive malignancy status: Secondary | ICD-10-CM | POA: Diagnosis not present

## 2021-10-30 DIAGNOSIS — J45909 Unspecified asthma, uncomplicated: Secondary | ICD-10-CM | POA: Diagnosis not present

## 2021-10-30 DIAGNOSIS — K219 Gastro-esophageal reflux disease without esophagitis: Secondary | ICD-10-CM | POA: Diagnosis not present

## 2021-10-30 DIAGNOSIS — Z7982 Long term (current) use of aspirin: Secondary | ICD-10-CM | POA: Insufficient documentation

## 2021-10-30 DIAGNOSIS — Z801 Family history of malignant neoplasm of trachea, bronchus and lung: Secondary | ICD-10-CM | POA: Diagnosis not present

## 2021-10-30 DIAGNOSIS — I2510 Atherosclerotic heart disease of native coronary artery without angina pectoris: Secondary | ICD-10-CM | POA: Insufficient documentation

## 2021-10-30 NOTE — Progress Notes (Signed)
Radiation Oncology         [phone] ________________________________  Initial Outpatient Consultation  Name: Tim Hardy MRN: [id number]  Date: 10/30/2021  DOB: 1941/01/08  [id number], Jenny Reichmann, MD  Myrlene Broker, MD   REFERRING PHYSICIAN: Myrlene Broker, MD  DIAGNOSIS: 81 y.o. gentleman with Stage T1c adenocarcinoma of the prostate with Gleason score of 4+3, and PSA of 20.74.    ICD-10-CM   1. Prostate CA [facility])  C61       HISTORY OF PRESENT ILLNESS: Tim Hardy is a 81 y.o. male with a diagnosis of prostate cancer. He longstanding history of elevated PSA and was initially diagnosed with Gleason 6 adenocarcinoma of the prostate on biopsy in 2014 with a PSA of 4.85 at that time.  He elected to proceed with active surveillance and has continued in close follow-up with Dr. Rosana Hoes over the years with stable Gleason 6 disease on repeat biopsies in 2015 and 2016.  He has a fluctuating PSA but overall, steadily rising as below: 7.51 in 2017 8.72 in 2018 11.6 in 2019, 12.76 in 2020  15.03 in 2021 14.8 in 2022  A prostate MRI was performed on 02/28/2020 as part of his active surveillance and this showed a PI-RADS 4 lesion in the posterior right mid gland.  A repeat fusion biopsy on 03/21/2020 showed stable Gleason 6 disease in 2 cores so he elected to continue active surveillance.  His most recent PSA was further elevated at 20.74 on 05/01/2021 although digital rectal examination performed at that time remained without nodularity or concerning findings.  A repeat prostate MRI was performed on 05/23/2021 showing stable size of the previously noted lesion but now appearing PI-RADS 5 with suspicion for small volume transcapsular spread.  A repeat MRI fusion transrectal ultrasound with 20 biopsies of the prostate was performed on 07/16/2021.  The prostate volume measured 67 cc.  Out of 20 core biopsies, 5 were positive.  The maximum Gleason score was 4+3, and this was seen in 3 cores  in the right mid and apex.  Additionally, Gleason 3+4 was seen in 2 cores in the left apex.  A PSMA PET scan was performed 08/21/2021 for disease staging and this showed concern for rectal invasion but no evidence of malignant lymphadenopathy or skeletal metastases.  He met with Dr. Tharon Aquas in radiation oncology most recently on 09/06/2021 and his recommendation was to proceed with ST-ADT concurrent with 28 fractions of prostate IMRT.  The patient reviewed the biopsy and imaging results with his urologist and he has kindly been referred today for discussion of potential radiation treatment options closer to his home here in [place].  He is accompanied by his friend, Silva Bandy, for today's visit.   PREVIOUS RADIATION THERAPY: No  PAST MEDICAL HISTORY:  Past Medical History:  Diagnosis Date   Asthma    Basal cell adenocarcinoma    BPH (benign prostatic hyperplasia)    Bronchitis    Cancer ([place])    skin cancer removed in August   Colon polyps    Colon, diverticulosis    Diastolic dysfunction    Dilated aortic root (HCC)    ED (erectile dysfunction)    GERD (gastroesophageal reflux disease)    Heart murmur    Hiatal hernia    History of aortic valve replacement with bioprosthetic valve    Incarcerated umbilical hernia 17/79/3903   Mild CAD    Perennial allergic rhinitis    Shortness of breath    Thoracic  ascending aortic aneurysm [facility])       PAST SURGICAL HISTORY: Past Surgical History:  Procedure Laterality Date   AORTIC VALVE REPLACEMENT  11/03/2011   Procedure: AORTIC VALVE REPLACEMENT (AVR);  Surgeon: Melrose Nakayama, MD;  Location: [facility];  Service: Open Heart Surgery;  Laterality: N/A;  Partial Sternotomy   APPENDECTOMY     CARDIAC CATHETERIZATION     COLONOSCOPY W/ POLYPECTOMY     COLONOSCOPY WITH PROPOFOL N/A 07/11/2014   Procedure: COLONOSCOPY WITH PROPOFOL;  Surgeon: Garlan Fair, MD;  Location: [facility];  Service: Endoscopy;  Laterality: N/A;    ESOPHAGOGASTRODUODENOSCOPY (EGD) WITH PROPOFOL N/A 07/11/2014   Procedure: ESOPHAGOGASTRODUODENOSCOPY (EGD) WITH PROPOFOL;  Surgeon: Garlan Fair, MD;  Location: [facility];  Service: Endoscopy;  Laterality: N/A;   FEMORAL HERNIA REPAIR     , x5   HERNIA REPAIR Bilateral    KNEE ARTHROSCOPY Bilateral    SHOULDER ARTHROSCOPY Right    STERNOTOMY  11/03/2011   Procedure: STERNOTOMY;  Surgeon: Melrose Nakayama, MD;  Location: [place];  Service: Open Heart Surgery;  Laterality: N/A;  Partial   TONSILLECTOMY     UMBILICAL HERNIA REPAIR N/A 10/20/2018   Procedure: OPEN REPAIR UMBILICAL HERNIA WITH  MESH;  Surgeon: Fanny Skates, MD;  Location: [place];  Service: General;  Laterality: N/A;    FAMILY HISTORY:  Family History  Problem Relation Age of Onset   Cancer Father        lung and colon   Hypertension Father    Heart attack Mother    Diabetes Sister    Diabetes Son     SOCIAL HISTORY:  Social History   Socioeconomic History   Marital status: Widowed    Spouse name: Not on file   Number of children: Not on file   Years of education: Not on file   Highest education level: Not on file  Occupational History   Occupation: retired  Tobacco Use   Smoking status: Former    Types: Cigarettes    Start date: 02/04/1971    Quit date: 02/03/1972    Years since quitting: 49.7   Smokeless tobacco: Current    Types: Chew  Vaping Use   Vaping Use: Never used  Substance and Sexual Activity   Alcohol use: Yes    Comment: rare   Drug use: No   Sexual activity: Not on file  Other Topics Concern   Not on file  Social History Narrative   Retired Chief Financial Officer at [employer]    He is a Physicist, medical and enjoys renovation log cabins     Social Determinants of Radio broadcast assistant Strain: Not on file  Food Insecurity: Not on file  Transportation Needs: Not on file  Physical Activity: Not on file  Stress: Not on file  Social Connections: Not on file   Intimate Partner Violence: Not on file    ALLERGIES: Penicillins  MEDICATIONS:  Current Outpatient Medications  Medication Sig Dispense Refill   acetaminophen (TYLENOL) 500 MG tablet Take 500 mg by mouth every 6 (six) hours as needed.     ALPRAZolam (XANAX) 0.5 MG tablet Take 0.25-0.5 mg by mouth 2 (two) times daily as needed.     aspirin EC 81 MG tablet Take 81 mg by mouth every morning.      atorvastatin (LIPITOR) 10 MG tablet Take 5 mg by mouth daily.     azithromycin (ZITHROMAX) 250 MG tablet Take 2 tabs by mouth  30-60 minutes prior to dental work 2 each 1   cetirizine (ZYRTEC) 10 MG tablet Take 10 mg by mouth as needed.     Cholecalciferol (VITAMIN D PO) Take 1 tablet by mouth every morning. Per patient taking 1,000 mcg     Cyanocobalamin (B-12 PO) Take 1 tablet by mouth every morning.      FLOVENT HFA 110 MCG/ACT inhaler Inhale 1 puff into the lungs 2 (two) times daily.     fluticasone (FLONASE) 50 MCG/ACT nasal spray Place 2 sprays into the nose daily as needed for allergies.      HYDROcodone-acetaminophen (NORCO) 5-325 MG tablet Take 1-2 tablets by mouth every 6 (six) hours as needed for moderate pain or severe pain. 20 tablet 0   losartan (COZAAR) 25 MG tablet SMARTSIG:1.5 Tablet(s) By Mouth Every Evening     Multiple Vitamin (MULTIVITAMIN WITH MINERALS) TABS Take 1 tablet by mouth every morning.      omeprazole (PRILOSEC) 10 MG capsule Take 1 capsule by mouth daily.     Tamsulosin HCl (FLOMAX) 0.4 MG CAPS Take 0.4 mg by mouth daily after supper.      No current facility-administered medications for this visit.    REVIEW OF SYSTEMS:  On review of systems, the patient reports that he is doing well overall. He denies any chest pain, shortness of breath, cough, fevers, chills, night sweats, unintended weight changes. He denies any bowel disturbances, and denies abdominal pain, nausea or vomiting. He denies any new musculoskeletal or joint aches or pains. His IPSS was 7, indicating  mild urinary symptoms with hesitancy, frequency/urgency and nocturia x1. His SHIM was 8, indicating he has severe erectile dysfunction. A complete review of systems is obtained and is otherwise negative.    PHYSICAL EXAM:  Wt Readings from Last 3 Encounters:  09/05/21 229 lb (103.9 kg)  07/24/21 231 lb (104.8 kg)  06/04/21 235 lb 3.2 oz (106.7 kg)   Temp Readings from Last 3 Encounters:  10/20/18 97.6 F (36.4 C) (Oral)  07/11/14 98.4 F (36.9 C)  12/09/11 98.7 F (37.1 C) (Oral)   BP Readings from Last 3 Encounters:  09/05/21 130/80  07/24/21 140/80  06/04/21 140/82   Pulse Readings from Last 3 Encounters:  09/05/21 77  07/24/21 84  06/04/21 81    /10  In general this is a well appearing Caucasian male in no acute distress. He's alert and oriented x4 and appropriate throughout the examination. Cardiopulmonary assessment is negative for acute distress, and he exhibits normal effort.     KPS = 100  100 - Normal; no complaints; no evidence of disease. 90   - Able to carry on normal activity; minor signs or symptoms of disease. 80   - Normal activity with effort; some signs or symptoms of disease. 75   - Cares for self; unable to carry on normal activity or to do active work. 60   - Requires occasional assistance, but is able to care for most of his personal needs. 50   - Requires considerable assistance and frequent medical care. 40   - Disabled; requires special care and assistance. 12   - Severely disabled; hospital admission is indicated although death not imminent. 23   - Very sick; hospital admission necessary; active supportive treatment necessary. 10   - Moribund; fatal processes progressing rapidly. 0     - Dead  Karnofsky DA, Abelmann WH, Craver LS and Burchenal [facility] [phone]) The use of the nitrogen mustards in the palliative treatment  of carcinoma: with particular reference to bronchogenic carcinoma Cancer 1 634-56  LABORATORY DATA:  Lab Results  Component Value  Date   WBC 12.2 (H) 06/04/2021   HGB 14.9 06/04/2021   HCT 43.0 06/04/2021   MCV 90 06/04/2021   PLT 229 06/04/2021   Lab Results  Component Value Date   NA 143 06/04/2021   K 4.0 06/04/2021   CL 105 06/04/2021   CO2 24 06/04/2021   Lab Results  Component Value Date   ALT 21 06/04/2021   AST 20 06/04/2021   ALKPHOS 55 06/04/2021   BILITOT 1.2 06/04/2021     RADIOGRAPHY: No results found.    IMPRESSION/PLAN: 1. 81 y.o. gentleman with Stage T1c adenocarcinoma of the prostate with Gleason Score of 4+3, and PSA of 20.74. We discussed the patient's workup and outlined the nature of prostate cancer in this setting. The patient's T stage, Gleason's score, and PSA put him into the high risk group. Accordingly, he is eligible for a variety of potential treatment options including ST-ADT in combination with either 5.5 weeks of external radiation or 5 weeks of external radiation with an upfront brachytherapy boost. We discussed the available radiation techniques, and focused on the details and logistics of delivery. The patient is not an ideal surgical candidate due to his advanced age.  Therefore, we discussed and outlined the risks, benefits, short and long-term effects associated with daily external beam radiotherapy and compared and contrasted these with prostatectomy. We also detailed the role of ADT in the treatment of unfavorable intermediate-high risk prostate cancer and outlined the associated side effects that could be expected with this therapy.  He and his friend, Silva Bandy, were encouraged to ask questions that were answered to their stated satisfaction.  At the conclusion of our conversation, the patient is interested in moving forward with 5.5 weeks of external beam therapy in combination with ST-ADT. We will share our discussion with Dr. Rosana Hoes and make arrangements for a follow-up visit to start ADT, first available.  We will also coordinate for fiducial marker placement in late  November 2023, prior to simulation, in anticipation of beginning his daily treatments approximately 2 months after starting ADT. The patient appears to have a good understanding of his disease and our treatment recommendations which are of curative intent and is in agreement with the stated plan.  We enjoyed meeting him and his friend, Silva Bandy, today and look forward to continuing to participate in his care.  We personally spent 70 minutes in this encounter including chart review, reviewing radiological studies, meeting face-to-face with the patient, entering orders and completing documentation.    Nicholos Johns, PA-C    Tyler Pita, MD  [facility] Direct Dial: [phone]  Fax: [phone] [email or web address]  Skype  LinkedIn

## 2021-10-31 NOTE — Progress Notes (Signed)
Called and introduced myself to the patient as the prostate nurse navigator.  No barriers to care identified at this time.  He had a RadOnc Consult on 9/27 to discuss his radiation treatment options.  He is proceeding with ADT followed by radiation.  We are currently awaiting appointment with Dr. Rosana Hoes to start ADT.  Provided patient with my direct contact number for any upcoming concerns or questions.

## 2021-11-04 NOTE — Progress Notes (Signed)
Patient is scheduled to see Dr. Rosana Hoes on 11/07/21 @ 9:30am to discuss/initiate ADT.    RN updated patient, patient verbalized understanding and agreement.

## 2021-11-04 NOTE — Progress Notes (Signed)
RN contacted Dr. Rosana Hoes office, Urology with [facility], to coordinate for patient to start ADT.   Message forward to provider by staff, direct contact information provided for call back once reviewed and scheduled.   Current plan of care in progress, will continue to follow.

## 2021-11-12 NOTE — Progress Notes (Signed)
Pt was recent RadOnc Consult with Dr. Tammi Klippel on 10/30/2021 for his stage T1c adenocarcinoma of the prostate with Gleason Score of 4+3, and PSA of 20.74.  Patient agreeable to proceed with ST-ADT followed by 5.5 weeks of radiation.   Patient received Lupron by Dr. Rosana Hoes' office on 10/5, and will be set up for fiducial's in November.   Plan of care in progress.

## 2021-12-09 ENCOUNTER — Telehealth: Payer: Self-pay | Admitting: *Deleted

## 2021-12-09 NOTE — Telephone Encounter (Signed)
RETURNED PATIENT'S PHONE CALL, SPOKE WITH PATIENT. ?

## 2022-01-01 ENCOUNTER — Telehealth: Payer: Self-pay | Admitting: *Deleted

## 2022-01-01 NOTE — Telephone Encounter (Signed)
Called patient to remind of sim appt. on 01-03-22- arrival time- 9:45 am @ [facility], informed patient to arrive with a full bladder, spoke with patient and he is aware of these appts. and the instructions

## 2022-01-03 ENCOUNTER — Ambulatory Visit
Admission: RE | Admit: 2022-01-03 | Discharge: 2022-01-03 | Disposition: A | Discharge status: Home / Self Care | Payer: Medicare Other | Source: Ambulatory Visit | Attending: Radiation Oncology | Admitting: Radiation Oncology

## 2022-01-03 DIAGNOSIS — Z191 Hormone sensitive malignancy status: Secondary | ICD-10-CM | POA: Diagnosis not present

## 2022-01-03 DIAGNOSIS — Z510 Encounter for antineoplastic radiation therapy: Secondary | ICD-10-CM | POA: Insufficient documentation

## 2022-01-03 DIAGNOSIS — C61 Malignant neoplasm of prostate: Secondary | ICD-10-CM | POA: Diagnosis present

## 2022-01-03 NOTE — Progress Notes (Signed)
  Radiation Oncology         [phone] ________________________________  Name: Tim Hardy MRN: [id number]  Date: 01/03/2022  DOB: [birth year]  SIMULATION AND TREATMENT PLANNING NOTE    ICD-10-CM   1. Prostate CA [facility])  C61       DIAGNOSIS:  81 y.o. gentleman with Stage T1c adenocarcinoma of the prostate with Gleason score of 4+3, and PSA of 20.74.   NARRATIVE:  The patient was brought to the [facility].  Identity was confirmed.  All relevant records and images related to the planned course of therapy were reviewed.  The patient freely provided informed written consent to proceed with treatment after reviewing the details related to the planned course of therapy. The consent form was witnessed and verified by the simulation staff.  Then, the patient was set-up in a stable reproducible supine position for radiation therapy.  A vacuum lock pillow device was custom fabricated to position his legs in a reproducible immobilized position.  Then, I performed a urethrogram under sterile conditions to identify the prostatic apex.  CT images were obtained.  Surface markings were placed.  The CT images were loaded into the planning software.  Then the prostate target and avoidance structures including the rectum, bladder, bowel and hips were contoured.  Treatment planning then occurred.  The radiation prescription was entered and confirmed.  A total of one complex treatment devices was fabricated. I have requested : Intensity Modulated Radiotherapy (IMRT) is medically necessary for this case for the following reason:  Rectal sparing.Marland Kitchen  PLAN:  The patient will receive 70 Gy in 28 fractions.  ________________________________  Sheral Apley Tammi Klippel, M.D.

## 2022-01-04 DIAGNOSIS — J069 Acute upper respiratory infection, unspecified: Secondary | ICD-10-CM | POA: Diagnosis not present

## 2022-01-04 DIAGNOSIS — H6123 Impacted cerumen, bilateral: Secondary | ICD-10-CM | POA: Diagnosis not present

## 2022-01-06 DIAGNOSIS — Z510 Encounter for antineoplastic radiation therapy: Secondary | ICD-10-CM | POA: Diagnosis not present

## 2022-01-06 DIAGNOSIS — Z191 Hormone sensitive malignancy status: Secondary | ICD-10-CM | POA: Diagnosis not present

## 2022-01-13 ENCOUNTER — Ambulatory Visit: Payer: Medicare Other | Admitting: Radiation Oncology

## 2022-01-14 ENCOUNTER — Ambulatory Visit: Payer: Medicare Other

## 2022-01-14 ENCOUNTER — Other Ambulatory Visit: Payer: Self-pay

## 2022-01-14 DIAGNOSIS — Z510 Encounter for antineoplastic radiation therapy: Secondary | ICD-10-CM | POA: Diagnosis not present

## 2022-01-14 DIAGNOSIS — Z191 Hormone sensitive malignancy status: Secondary | ICD-10-CM | POA: Diagnosis not present

## 2022-01-14 LAB — RAD ONC ARIA SESSION SUMMARY
Course Elapsed Days: 0
Plan Fractions Treated to Date: 1
Plan Prescribed Dose Per Fraction: 2.5 Gy
Plan Total Fractions Prescribed: 28
Plan Total Prescribed Dose: 70 Gy
Reference Point Dosage Given to Date: 2.5 Gy
Reference Point Session Dosage Given: 2.5 Gy
Session Number: 1

## 2022-01-15 ENCOUNTER — Ambulatory Visit
Admission: RE | Admit: 2022-01-15 | Discharge: 2022-01-15 | Disposition: A | Discharge status: Home / Self Care | Payer: Medicare Other | Source: Ambulatory Visit | Attending: Radiation Oncology | Admitting: Radiation Oncology

## 2022-01-15 ENCOUNTER — Other Ambulatory Visit: Payer: Self-pay

## 2022-01-15 DIAGNOSIS — K219 Gastro-esophageal reflux disease without esophagitis: Secondary | ICD-10-CM | POA: Diagnosis not present

## 2022-01-15 DIAGNOSIS — J45998 Other asthma: Secondary | ICD-10-CM | POA: Diagnosis not present

## 2022-01-15 DIAGNOSIS — E7800 Pure hypercholesterolemia, unspecified: Secondary | ICD-10-CM | POA: Diagnosis not present

## 2022-01-15 DIAGNOSIS — Z191 Hormone sensitive malignancy status: Secondary | ICD-10-CM | POA: Diagnosis not present

## 2022-01-15 DIAGNOSIS — Z23 Encounter for immunization: Secondary | ICD-10-CM | POA: Diagnosis not present

## 2022-01-15 DIAGNOSIS — I10 Essential (primary) hypertension: Secondary | ICD-10-CM | POA: Diagnosis not present

## 2022-01-15 DIAGNOSIS — Z510 Encounter for antineoplastic radiation therapy: Secondary | ICD-10-CM | POA: Diagnosis not present

## 2022-01-15 LAB — RAD ONC ARIA SESSION SUMMARY
Course Elapsed Days: 1
Plan Fractions Treated to Date: 2
Plan Prescribed Dose Per Fraction: 2.5 Gy
Plan Total Fractions Prescribed: 28
Plan Total Prescribed Dose: 70 Gy
Reference Point Dosage Given to Date: 5 Gy
Reference Point Session Dosage Given: 2.5 Gy
Session Number: 2

## 2022-01-16 ENCOUNTER — Other Ambulatory Visit: Payer: Self-pay

## 2022-01-16 ENCOUNTER — Ambulatory Visit
Admission: RE | Admit: 2022-01-16 | Discharge: 2022-01-16 | Disposition: A | Discharge status: Home / Self Care | Payer: Medicare Other | Source: Ambulatory Visit | Attending: Radiation Oncology | Admitting: Radiation Oncology

## 2022-01-16 DIAGNOSIS — Z191 Hormone sensitive malignancy status: Secondary | ICD-10-CM | POA: Diagnosis not present

## 2022-01-16 DIAGNOSIS — Z510 Encounter for antineoplastic radiation therapy: Secondary | ICD-10-CM | POA: Diagnosis not present

## 2022-01-16 LAB — RAD ONC ARIA SESSION SUMMARY
Course Elapsed Days: 2
Plan Fractions Treated to Date: 3
Plan Prescribed Dose Per Fraction: 2.5 Gy
Plan Total Fractions Prescribed: 28
Plan Total Prescribed Dose: 70 Gy
Reference Point Dosage Given to Date: 7.5 Gy
Reference Point Session Dosage Given: 2.5 Gy
Session Number: 3

## 2022-01-17 ENCOUNTER — Ambulatory Visit
Admission: RE | Admit: 2022-01-17 | Discharge: 2022-01-17 | Disposition: A | Discharge status: Home / Self Care | Payer: Medicare Other | Source: Ambulatory Visit | Attending: Radiation Oncology | Admitting: Radiation Oncology

## 2022-01-17 ENCOUNTER — Other Ambulatory Visit: Payer: Self-pay

## 2022-01-17 DIAGNOSIS — Z510 Encounter for antineoplastic radiation therapy: Secondary | ICD-10-CM | POA: Diagnosis not present

## 2022-01-17 DIAGNOSIS — Z191 Hormone sensitive malignancy status: Secondary | ICD-10-CM | POA: Diagnosis not present

## 2022-01-17 LAB — RAD ONC ARIA SESSION SUMMARY
Course Elapsed Days: 3
Plan Fractions Treated to Date: 4
Plan Prescribed Dose Per Fraction: 2.5 Gy
Plan Total Fractions Prescribed: 28
Plan Total Prescribed Dose: 70 Gy
Reference Point Dosage Given to Date: 10 Gy
Reference Point Session Dosage Given: 2.5 Gy
Session Number: 4

## 2022-01-20 ENCOUNTER — Ambulatory Visit
Admission: RE | Admit: 2022-01-20 | Discharge: 2022-01-20 | Disposition: A | Discharge status: Home / Self Care | Payer: Medicare Other | Source: Ambulatory Visit | Attending: Radiation Oncology | Admitting: Radiation Oncology

## 2022-01-20 ENCOUNTER — Other Ambulatory Visit: Payer: Self-pay

## 2022-01-20 DIAGNOSIS — Z510 Encounter for antineoplastic radiation therapy: Secondary | ICD-10-CM | POA: Diagnosis not present

## 2022-01-20 DIAGNOSIS — Z191 Hormone sensitive malignancy status: Secondary | ICD-10-CM | POA: Diagnosis not present

## 2022-01-20 LAB — RAD ONC ARIA SESSION SUMMARY
Course Elapsed Days: 6
Plan Fractions Treated to Date: 5
Plan Prescribed Dose Per Fraction: 2.5 Gy
Plan Total Fractions Prescribed: 28
Plan Total Prescribed Dose: 70 Gy
Reference Point Dosage Given to Date: 12.5 Gy
Reference Point Session Dosage Given: 2.5 Gy
Session Number: 5

## 2022-01-21 ENCOUNTER — Other Ambulatory Visit: Payer: Self-pay

## 2022-01-21 ENCOUNTER — Ambulatory Visit
Admission: RE | Admit: 2022-01-21 | Discharge: 2022-01-21 | Disposition: A | Discharge status: Home / Self Care | Payer: Medicare Other | Source: Ambulatory Visit | Attending: Radiation Oncology | Admitting: Radiation Oncology

## 2022-01-21 DIAGNOSIS — Z510 Encounter for antineoplastic radiation therapy: Secondary | ICD-10-CM | POA: Diagnosis not present

## 2022-01-21 DIAGNOSIS — Z191 Hormone sensitive malignancy status: Secondary | ICD-10-CM | POA: Diagnosis not present

## 2022-01-21 LAB — RAD ONC ARIA SESSION SUMMARY
Course Elapsed Days: 7
Plan Fractions Treated to Date: 6
Plan Prescribed Dose Per Fraction: 2.5 Gy
Plan Total Fractions Prescribed: 28
Plan Total Prescribed Dose: 70 Gy
Reference Point Dosage Given to Date: 15 Gy
Reference Point Session Dosage Given: 2.5 Gy
Session Number: 6

## 2022-01-22 ENCOUNTER — Ambulatory Visit
Admission: RE | Admit: 2022-01-22 | Discharge: 2022-01-22 | Disposition: A | Discharge status: Home / Self Care | Payer: Medicare Other | Source: Ambulatory Visit | Attending: Radiation Oncology | Admitting: Radiation Oncology

## 2022-01-22 ENCOUNTER — Other Ambulatory Visit: Payer: Self-pay

## 2022-01-22 DIAGNOSIS — Z191 Hormone sensitive malignancy status: Secondary | ICD-10-CM | POA: Diagnosis not present

## 2022-01-22 DIAGNOSIS — Z510 Encounter for antineoplastic radiation therapy: Secondary | ICD-10-CM | POA: Diagnosis not present

## 2022-01-22 LAB — RAD ONC ARIA SESSION SUMMARY
Course Elapsed Days: 8
Plan Fractions Treated to Date: 7
Plan Prescribed Dose Per Fraction: 2.5 Gy
Plan Total Fractions Prescribed: 28
Plan Total Prescribed Dose: 70 Gy
Reference Point Dosage Given to Date: 17.5 Gy
Reference Point Session Dosage Given: 2.5 Gy
Session Number: 7

## 2022-01-23 ENCOUNTER — Other Ambulatory Visit: Payer: Self-pay

## 2022-01-23 ENCOUNTER — Ambulatory Visit
Admission: RE | Admit: 2022-01-23 | Discharge: 2022-01-23 | Disposition: A | Discharge status: Home / Self Care | Payer: Medicare Other | Source: Ambulatory Visit | Attending: Radiation Oncology | Admitting: Radiation Oncology

## 2022-01-23 DIAGNOSIS — Z191 Hormone sensitive malignancy status: Secondary | ICD-10-CM | POA: Diagnosis not present

## 2022-01-23 DIAGNOSIS — Z510 Encounter for antineoplastic radiation therapy: Secondary | ICD-10-CM | POA: Diagnosis not present

## 2022-01-23 LAB — RAD ONC ARIA SESSION SUMMARY
Course Elapsed Days: 9
Plan Fractions Treated to Date: 8
Plan Prescribed Dose Per Fraction: 2.5 Gy
Plan Total Fractions Prescribed: 28
Plan Total Prescribed Dose: 70 Gy
Reference Point Dosage Given to Date: 20 Gy
Reference Point Session Dosage Given: 2.5 Gy
Session Number: 8

## 2022-01-24 ENCOUNTER — Other Ambulatory Visit: Payer: Self-pay

## 2022-01-24 ENCOUNTER — Ambulatory Visit
Admission: RE | Admit: 2022-01-24 | Discharge: 2022-01-24 | Disposition: A | Discharge status: Home / Self Care | Payer: Medicare Other | Source: Ambulatory Visit | Attending: Radiation Oncology | Admitting: Radiation Oncology

## 2022-01-24 DIAGNOSIS — Z191 Hormone sensitive malignancy status: Secondary | ICD-10-CM | POA: Diagnosis not present

## 2022-01-24 DIAGNOSIS — Z510 Encounter for antineoplastic radiation therapy: Secondary | ICD-10-CM | POA: Diagnosis not present

## 2022-01-24 LAB — RAD ONC ARIA SESSION SUMMARY
Course Elapsed Days: 10
Plan Fractions Treated to Date: 9
Plan Prescribed Dose Per Fraction: 2.5 Gy
Plan Total Fractions Prescribed: 28
Plan Total Prescribed Dose: 70 Gy
Reference Point Dosage Given to Date: 22.5 Gy
Reference Point Session Dosage Given: 2.5 Gy
Session Number: 9

## 2022-01-28 ENCOUNTER — Ambulatory Visit
Admission: RE | Admit: 2022-01-28 | Discharge: 2022-01-28 | Disposition: A | Discharge status: Home / Self Care | Payer: Medicare Other | Source: Ambulatory Visit | Attending: Radiation Oncology | Admitting: Radiation Oncology

## 2022-01-28 ENCOUNTER — Other Ambulatory Visit: Payer: Self-pay

## 2022-01-28 DIAGNOSIS — Z510 Encounter for antineoplastic radiation therapy: Secondary | ICD-10-CM | POA: Diagnosis not present

## 2022-01-28 DIAGNOSIS — Z191 Hormone sensitive malignancy status: Secondary | ICD-10-CM | POA: Diagnosis not present

## 2022-01-28 LAB — RAD ONC ARIA SESSION SUMMARY
Course Elapsed Days: 14
Plan Fractions Treated to Date: 10
Plan Prescribed Dose Per Fraction: 2.5 Gy
Plan Total Fractions Prescribed: 28
Plan Total Prescribed Dose: 70 Gy
Reference Point Dosage Given to Date: 25 Gy
Reference Point Session Dosage Given: 2.5 Gy
Session Number: 10

## 2022-01-29 ENCOUNTER — Other Ambulatory Visit: Payer: Self-pay

## 2022-01-29 ENCOUNTER — Ambulatory Visit
Admission: RE | Admit: 2022-01-29 | Discharge: 2022-01-29 | Disposition: A | Discharge status: Home / Self Care | Payer: Medicare Other | Source: Ambulatory Visit | Attending: Radiation Oncology | Admitting: Radiation Oncology

## 2022-01-29 DIAGNOSIS — Z510 Encounter for antineoplastic radiation therapy: Secondary | ICD-10-CM | POA: Diagnosis not present

## 2022-01-29 DIAGNOSIS — Z191 Hormone sensitive malignancy status: Secondary | ICD-10-CM | POA: Diagnosis not present

## 2022-01-29 LAB — RAD ONC ARIA SESSION SUMMARY
Course Elapsed Days: 15
Plan Fractions Treated to Date: 11
Plan Prescribed Dose Per Fraction: 2.5 Gy
Plan Total Fractions Prescribed: 28
Plan Total Prescribed Dose: 70 Gy
Reference Point Dosage Given to Date: 27.5 Gy
Reference Point Session Dosage Given: 2.5 Gy
Session Number: 11

## 2022-01-30 ENCOUNTER — Ambulatory Visit
Admission: RE | Admit: 2022-01-30 | Discharge: 2022-01-30 | Disposition: A | Discharge status: Home / Self Care | Payer: Medicare Other | Source: Ambulatory Visit | Attending: Radiation Oncology | Admitting: Radiation Oncology

## 2022-01-30 ENCOUNTER — Other Ambulatory Visit: Payer: Self-pay

## 2022-01-30 DIAGNOSIS — Z510 Encounter for antineoplastic radiation therapy: Secondary | ICD-10-CM | POA: Diagnosis not present

## 2022-01-30 DIAGNOSIS — Z191 Hormone sensitive malignancy status: Secondary | ICD-10-CM | POA: Diagnosis not present

## 2022-01-30 LAB — RAD ONC ARIA SESSION SUMMARY
Course Elapsed Days: 16
Plan Fractions Treated to Date: 12
Plan Prescribed Dose Per Fraction: 2.5 Gy
Plan Total Fractions Prescribed: 28
Plan Total Prescribed Dose: 70 Gy
Reference Point Dosage Given to Date: 30 Gy
Reference Point Session Dosage Given: 2.5 Gy
Session Number: 12

## 2022-01-31 ENCOUNTER — Other Ambulatory Visit: Payer: Self-pay

## 2022-01-31 ENCOUNTER — Ambulatory Visit
Admission: RE | Admit: 2022-01-31 | Discharge: 2022-01-31 | Disposition: A | Discharge status: Home / Self Care | Payer: Medicare Other | Source: Ambulatory Visit | Attending: Radiation Oncology | Admitting: Radiation Oncology

## 2022-01-31 DIAGNOSIS — Z510 Encounter for antineoplastic radiation therapy: Secondary | ICD-10-CM | POA: Diagnosis not present

## 2022-01-31 DIAGNOSIS — Z191 Hormone sensitive malignancy status: Secondary | ICD-10-CM | POA: Diagnosis not present

## 2022-01-31 LAB — RAD ONC ARIA SESSION SUMMARY
Course Elapsed Days: 17
Plan Fractions Treated to Date: 13
Plan Prescribed Dose Per Fraction: 2.5 Gy
Plan Total Fractions Prescribed: 28
Plan Total Prescribed Dose: 70 Gy
Reference Point Dosage Given to Date: 32.5 Gy
Reference Point Session Dosage Given: 2.5 Gy
Session Number: 13

## 2022-02-04 ENCOUNTER — Other Ambulatory Visit: Payer: Self-pay

## 2022-02-04 ENCOUNTER — Ambulatory Visit
Admission: RE | Admit: 2022-02-04 | Discharge: 2022-02-04 | Disposition: A | Discharge status: Home / Self Care | Payer: Medicare Other | Source: Ambulatory Visit | Attending: Radiation Oncology | Admitting: Radiation Oncology

## 2022-02-04 DIAGNOSIS — Z510 Encounter for antineoplastic radiation therapy: Secondary | ICD-10-CM | POA: Diagnosis not present

## 2022-02-04 DIAGNOSIS — C61 Malignant neoplasm of prostate: Secondary | ICD-10-CM | POA: Insufficient documentation

## 2022-02-04 DIAGNOSIS — Z191 Hormone sensitive malignancy status: Secondary | ICD-10-CM | POA: Diagnosis not present

## 2022-02-04 LAB — RAD ONC ARIA SESSION SUMMARY
Course Elapsed Days: 21
Plan Fractions Treated to Date: 14
Plan Prescribed Dose Per Fraction: 2.5 Gy
Plan Total Fractions Prescribed: 28
Plan Total Prescribed Dose: 70 Gy
Reference Point Dosage Given to Date: 35 Gy
Reference Point Session Dosage Given: 2.5 Gy
Session Number: 14

## 2022-02-05 ENCOUNTER — Ambulatory Visit
Admission: RE | Admit: 2022-02-05 | Discharge: 2022-02-05 | Disposition: A | Discharge status: Home / Self Care | Payer: Medicare Other | Source: Ambulatory Visit | Attending: Radiation Oncology | Admitting: Radiation Oncology

## 2022-02-05 ENCOUNTER — Other Ambulatory Visit: Payer: Self-pay

## 2022-02-05 DIAGNOSIS — Z510 Encounter for antineoplastic radiation therapy: Secondary | ICD-10-CM | POA: Diagnosis not present

## 2022-02-05 DIAGNOSIS — Z191 Hormone sensitive malignancy status: Secondary | ICD-10-CM | POA: Diagnosis not present

## 2022-02-05 LAB — RAD ONC ARIA SESSION SUMMARY
Course Elapsed Days: 22
Plan Fractions Treated to Date: 15
Plan Prescribed Dose Per Fraction: 2.5 Gy
Plan Total Fractions Prescribed: 28
Plan Total Prescribed Dose: 70 Gy
Reference Point Dosage Given to Date: 37.5 Gy
Reference Point Session Dosage Given: 2.5 Gy
Session Number: 15

## 2022-02-06 ENCOUNTER — Other Ambulatory Visit: Payer: Self-pay

## 2022-02-06 ENCOUNTER — Ambulatory Visit
Admission: RE | Admit: 2022-02-06 | Discharge: 2022-02-06 | Disposition: A | Discharge status: Home / Self Care | Payer: Medicare Other | Source: Ambulatory Visit | Attending: Radiation Oncology | Admitting: Radiation Oncology

## 2022-02-06 DIAGNOSIS — Z191 Hormone sensitive malignancy status: Secondary | ICD-10-CM | POA: Diagnosis not present

## 2022-02-06 DIAGNOSIS — Z510 Encounter for antineoplastic radiation therapy: Secondary | ICD-10-CM | POA: Diagnosis not present

## 2022-02-06 LAB — RAD ONC ARIA SESSION SUMMARY
Course Elapsed Days: 23
Plan Fractions Treated to Date: 16
Plan Prescribed Dose Per Fraction: 2.5 Gy
Plan Total Fractions Prescribed: 28
Plan Total Prescribed Dose: 70 Gy
Reference Point Dosage Given to Date: 40 Gy
Reference Point Session Dosage Given: 2.5 Gy
Session Number: 16

## 2022-02-07 ENCOUNTER — Other Ambulatory Visit: Payer: Self-pay

## 2022-02-07 ENCOUNTER — Ambulatory Visit: Payer: Medicare Other

## 2022-02-07 ENCOUNTER — Ambulatory Visit
Admission: RE | Admit: 2022-02-07 | Discharge: 2022-02-07 | Disposition: A | Discharge status: Home / Self Care | Payer: Medicare Other | Source: Ambulatory Visit | Attending: Radiation Oncology | Admitting: Radiation Oncology

## 2022-02-07 DIAGNOSIS — Z191 Hormone sensitive malignancy status: Secondary | ICD-10-CM | POA: Diagnosis not present

## 2022-02-07 DIAGNOSIS — Z510 Encounter for antineoplastic radiation therapy: Secondary | ICD-10-CM | POA: Diagnosis not present

## 2022-02-07 LAB — RAD ONC ARIA SESSION SUMMARY
Course Elapsed Days: 24
Plan Fractions Treated to Date: 17
Plan Prescribed Dose Per Fraction: 2.5 Gy
Plan Total Fractions Prescribed: 28
Plan Total Prescribed Dose: 70 Gy
Reference Point Dosage Given to Date: 42.5 Gy
Reference Point Session Dosage Given: 2.5 Gy
Session Number: 17

## 2022-02-10 ENCOUNTER — Ambulatory Visit
Admission: RE | Admit: 2022-02-10 | Discharge: 2022-02-10 | Disposition: A | Discharge status: Home / Self Care | Payer: Medicare Other | Source: Ambulatory Visit | Attending: Radiation Oncology | Admitting: Radiation Oncology

## 2022-02-10 ENCOUNTER — Other Ambulatory Visit: Payer: Self-pay

## 2022-02-10 DIAGNOSIS — Z510 Encounter for antineoplastic radiation therapy: Secondary | ICD-10-CM | POA: Diagnosis not present

## 2022-02-10 DIAGNOSIS — Z191 Hormone sensitive malignancy status: Secondary | ICD-10-CM | POA: Diagnosis not present

## 2022-02-10 LAB — RAD ONC ARIA SESSION SUMMARY
Course Elapsed Days: 27
Plan Fractions Treated to Date: 18
Plan Prescribed Dose Per Fraction: 2.5 Gy
Plan Total Fractions Prescribed: 28
Plan Total Prescribed Dose: 70 Gy
Reference Point Dosage Given to Date: 45 Gy
Reference Point Session Dosage Given: 2.5 Gy
Session Number: 18

## 2022-02-11 ENCOUNTER — Ambulatory Visit
Admission: RE | Admit: 2022-02-11 | Discharge: 2022-02-11 | Disposition: A | Discharge status: Home / Self Care | Payer: Medicare Other | Source: Ambulatory Visit | Attending: Radiation Oncology | Admitting: Radiation Oncology

## 2022-02-11 ENCOUNTER — Other Ambulatory Visit: Payer: Self-pay

## 2022-02-11 DIAGNOSIS — Z191 Hormone sensitive malignancy status: Secondary | ICD-10-CM | POA: Diagnosis not present

## 2022-02-11 DIAGNOSIS — Z510 Encounter for antineoplastic radiation therapy: Secondary | ICD-10-CM | POA: Diagnosis not present

## 2022-02-11 LAB — RAD ONC ARIA SESSION SUMMARY
Course Elapsed Days: 28
Plan Fractions Treated to Date: 19
Plan Prescribed Dose Per Fraction: 2.5 Gy
Plan Total Fractions Prescribed: 28
Plan Total Prescribed Dose: 70 Gy
Reference Point Dosage Given to Date: 47.5 Gy
Reference Point Session Dosage Given: 2.5 Gy
Session Number: 19

## 2022-02-12 ENCOUNTER — Other Ambulatory Visit: Payer: Self-pay

## 2022-02-12 ENCOUNTER — Ambulatory Visit
Admission: RE | Admit: 2022-02-12 | Discharge: 2022-02-12 | Disposition: A | Discharge status: Home / Self Care | Payer: Medicare Other | Source: Ambulatory Visit | Attending: Radiation Oncology | Admitting: Radiation Oncology

## 2022-02-12 DIAGNOSIS — Z510 Encounter for antineoplastic radiation therapy: Secondary | ICD-10-CM | POA: Diagnosis not present

## 2022-02-12 DIAGNOSIS — Z191 Hormone sensitive malignancy status: Secondary | ICD-10-CM | POA: Diagnosis not present

## 2022-02-12 LAB — RAD ONC ARIA SESSION SUMMARY
Course Elapsed Days: 29
Plan Fractions Treated to Date: 20
Plan Prescribed Dose Per Fraction: 2.5 Gy
Plan Total Fractions Prescribed: 28
Plan Total Prescribed Dose: 70 Gy
Reference Point Dosage Given to Date: 50 Gy
Reference Point Session Dosage Given: 2.5 Gy
Session Number: 20

## 2022-02-13 ENCOUNTER — Ambulatory Visit
Admission: RE | Admit: 2022-02-13 | Discharge: 2022-02-13 | Disposition: A | Discharge status: Home / Self Care | Payer: Medicare Other | Source: Ambulatory Visit | Attending: Radiation Oncology | Admitting: Radiation Oncology

## 2022-02-13 ENCOUNTER — Other Ambulatory Visit: Payer: Self-pay

## 2022-02-13 DIAGNOSIS — Z191 Hormone sensitive malignancy status: Secondary | ICD-10-CM | POA: Diagnosis not present

## 2022-02-13 DIAGNOSIS — Z510 Encounter for antineoplastic radiation therapy: Secondary | ICD-10-CM | POA: Diagnosis not present

## 2022-02-13 LAB — RAD ONC ARIA SESSION SUMMARY
Course Elapsed Days: 30
Plan Fractions Treated to Date: 21
Plan Prescribed Dose Per Fraction: 2.5 Gy
Plan Total Fractions Prescribed: 28
Plan Total Prescribed Dose: 70 Gy
Reference Point Dosage Given to Date: 52.5 Gy
Reference Point Session Dosage Given: 2.5 Gy
Session Number: 21

## 2022-02-14 ENCOUNTER — Ambulatory Visit
Admission: RE | Admit: 2022-02-14 | Discharge: 2022-02-14 | Disposition: A | Discharge status: Home / Self Care | Payer: Medicare Other | Source: Ambulatory Visit | Attending: Radiation Oncology | Admitting: Radiation Oncology

## 2022-02-14 ENCOUNTER — Other Ambulatory Visit: Payer: Self-pay

## 2022-02-14 DIAGNOSIS — Z191 Hormone sensitive malignancy status: Secondary | ICD-10-CM | POA: Diagnosis not present

## 2022-02-14 DIAGNOSIS — Z510 Encounter for antineoplastic radiation therapy: Secondary | ICD-10-CM | POA: Diagnosis not present

## 2022-02-14 LAB — RAD ONC ARIA SESSION SUMMARY
Course Elapsed Days: 31
Plan Fractions Treated to Date: 22
Plan Prescribed Dose Per Fraction: 2.5 Gy
Plan Total Fractions Prescribed: 28
Plan Total Prescribed Dose: 70 Gy
Reference Point Dosage Given to Date: 55 Gy
Reference Point Session Dosage Given: 2.5 Gy
Session Number: 22

## 2022-02-17 ENCOUNTER — Ambulatory Visit
Admission: RE | Admit: 2022-02-17 | Discharge: 2022-02-17 | Disposition: A | Discharge status: Home / Self Care | Payer: Medicare Other | Source: Ambulatory Visit | Attending: Radiation Oncology | Admitting: Radiation Oncology

## 2022-02-17 ENCOUNTER — Other Ambulatory Visit: Payer: Self-pay

## 2022-02-17 DIAGNOSIS — Z191 Hormone sensitive malignancy status: Secondary | ICD-10-CM | POA: Diagnosis not present

## 2022-02-17 DIAGNOSIS — Z510 Encounter for antineoplastic radiation therapy: Secondary | ICD-10-CM | POA: Diagnosis not present

## 2022-02-17 LAB — RAD ONC ARIA SESSION SUMMARY
Course Elapsed Days: 34
Plan Fractions Treated to Date: 23
Plan Prescribed Dose Per Fraction: 2.5 Gy
Plan Total Fractions Prescribed: 28
Plan Total Prescribed Dose: 70 Gy
Reference Point Dosage Given to Date: 57.5 Gy
Reference Point Session Dosage Given: 2.5 Gy
Session Number: 23

## 2022-02-18 ENCOUNTER — Ambulatory Visit
Admission: RE | Admit: 2022-02-18 | Discharge: 2022-02-18 | Disposition: A | Discharge status: Home / Self Care | Payer: Medicare Other | Source: Ambulatory Visit | Attending: Radiation Oncology | Admitting: Radiation Oncology

## 2022-02-18 ENCOUNTER — Other Ambulatory Visit: Payer: Self-pay

## 2022-02-18 DIAGNOSIS — Z510 Encounter for antineoplastic radiation therapy: Secondary | ICD-10-CM | POA: Diagnosis not present

## 2022-02-18 DIAGNOSIS — Z191 Hormone sensitive malignancy status: Secondary | ICD-10-CM | POA: Diagnosis not present

## 2022-02-18 LAB — RAD ONC ARIA SESSION SUMMARY
Course Elapsed Days: 35
Plan Fractions Treated to Date: 24
Plan Prescribed Dose Per Fraction: 2.5 Gy
Plan Total Fractions Prescribed: 28
Plan Total Prescribed Dose: 70 Gy
Reference Point Dosage Given to Date: 60 Gy
Reference Point Session Dosage Given: 2.5 Gy
Session Number: 24

## 2022-02-19 ENCOUNTER — Other Ambulatory Visit: Payer: Self-pay

## 2022-02-19 ENCOUNTER — Ambulatory Visit
Admission: RE | Admit: 2022-02-19 | Discharge: 2022-02-19 | Disposition: A | Discharge status: Home / Self Care | Payer: Medicare Other | Source: Ambulatory Visit | Attending: Radiation Oncology | Admitting: Radiation Oncology

## 2022-02-19 DIAGNOSIS — Z510 Encounter for antineoplastic radiation therapy: Secondary | ICD-10-CM | POA: Diagnosis not present

## 2022-02-19 DIAGNOSIS — Z191 Hormone sensitive malignancy status: Secondary | ICD-10-CM | POA: Diagnosis not present

## 2022-02-19 LAB — RAD ONC ARIA SESSION SUMMARY
Course Elapsed Days: 36
Plan Fractions Treated to Date: 25
Plan Prescribed Dose Per Fraction: 2.5 Gy
Plan Total Fractions Prescribed: 28
Plan Total Prescribed Dose: 70 Gy
Reference Point Dosage Given to Date: 62.5 Gy
Reference Point Session Dosage Given: 2.5 Gy
Session Number: 25

## 2022-02-20 ENCOUNTER — Ambulatory Visit
Admission: RE | Admit: 2022-02-20 | Discharge: 2022-02-20 | Disposition: A | Discharge status: Home / Self Care | Payer: Medicare Other | Source: Ambulatory Visit | Attending: Radiation Oncology | Admitting: Radiation Oncology

## 2022-02-20 ENCOUNTER — Other Ambulatory Visit: Payer: Self-pay

## 2022-02-20 ENCOUNTER — Ambulatory Visit: Payer: Medicare Other

## 2022-02-20 DIAGNOSIS — Z191 Hormone sensitive malignancy status: Secondary | ICD-10-CM | POA: Diagnosis not present

## 2022-02-20 DIAGNOSIS — Z510 Encounter for antineoplastic radiation therapy: Secondary | ICD-10-CM | POA: Diagnosis not present

## 2022-02-20 LAB — RAD ONC ARIA SESSION SUMMARY
Course Elapsed Days: 37
Plan Fractions Treated to Date: 26
Plan Prescribed Dose Per Fraction: 2.5 Gy
Plan Total Fractions Prescribed: 28
Plan Total Prescribed Dose: 70 Gy
Reference Point Dosage Given to Date: 65 Gy
Reference Point Session Dosage Given: 2.5 Gy
Session Number: 26

## 2022-02-21 ENCOUNTER — Ambulatory Visit: Payer: Medicare Other

## 2022-02-21 ENCOUNTER — Other Ambulatory Visit: Payer: Self-pay

## 2022-02-21 ENCOUNTER — Ambulatory Visit
Admission: RE | Admit: 2022-02-21 | Discharge: 2022-02-21 | Disposition: A | Discharge status: Home / Self Care | Payer: Medicare Other | Source: Ambulatory Visit | Attending: Radiation Oncology | Admitting: Radiation Oncology

## 2022-02-21 DIAGNOSIS — Z191 Hormone sensitive malignancy status: Secondary | ICD-10-CM | POA: Diagnosis not present

## 2022-02-21 DIAGNOSIS — Z510 Encounter for antineoplastic radiation therapy: Secondary | ICD-10-CM | POA: Diagnosis not present

## 2022-02-21 LAB — RAD ONC ARIA SESSION SUMMARY
Course Elapsed Days: 38
Plan Fractions Treated to Date: 27
Plan Prescribed Dose Per Fraction: 2.5 Gy
Plan Total Fractions Prescribed: 28
Plan Total Prescribed Dose: 70 Gy
Reference Point Dosage Given to Date: 67.5 Gy
Reference Point Session Dosage Given: 2.5 Gy
Session Number: 27

## 2022-02-24 ENCOUNTER — Ambulatory Visit
Admission: RE | Admit: 2022-02-24 | Discharge: 2022-02-24 | Disposition: A | Discharge status: Home / Self Care | Payer: Medicare Other | Source: Ambulatory Visit | Attending: Radiation Oncology | Admitting: Radiation Oncology

## 2022-02-24 ENCOUNTER — Encounter: Payer: Self-pay | Admitting: Urology

## 2022-02-24 ENCOUNTER — Other Ambulatory Visit: Payer: Self-pay

## 2022-02-24 DIAGNOSIS — Z510 Encounter for antineoplastic radiation therapy: Secondary | ICD-10-CM | POA: Diagnosis not present

## 2022-02-24 DIAGNOSIS — Z191 Hormone sensitive malignancy status: Secondary | ICD-10-CM | POA: Diagnosis not present

## 2022-02-24 LAB — RAD ONC ARIA SESSION SUMMARY
Course Elapsed Days: 41
Plan Fractions Treated to Date: 28
Plan Prescribed Dose Per Fraction: 2.5 Gy
Plan Total Fractions Prescribed: 28
Plan Total Prescribed Dose: 70 Gy
Reference Point Dosage Given to Date: 70 Gy
Reference Point Session Dosage Given: 2.5 Gy
Session Number: 28

## 2022-02-26 DIAGNOSIS — I10 Essential (primary) hypertension: Secondary | ICD-10-CM | POA: Diagnosis not present

## 2022-02-28 NOTE — Progress Notes (Signed)
Patient recently completed his radiation treatment on 02/24/2022 for his stage T1c adenocarcinoma of the prostate with Gleason score of 4+3, and PSA of 20.74.   RN spoke with patient to follow up to ensure he has a urology follow up appointment with Dr. Rosana Hoes.  Patient placed call on 1/25 to get scheduled for follow up.  RN educated patient on what to expect with continuation of monitoring PSA, verbalized understanding and thankfulness.    RN encouraged patient to reach out with any additional questions, and he will follow up to ensure Dr. Rosana Hoes' office gets back with him for appointment.    No further needs at this time.

## 2022-03-21 ENCOUNTER — Other Ambulatory Visit: Payer: Self-pay | Admitting: Urology

## 2022-03-21 DIAGNOSIS — C61 Malignant neoplasm of prostate: Secondary | ICD-10-CM

## 2022-03-21 NOTE — Progress Notes (Signed)
                                                                                                                                                             Patient Name: Tim Hardy MRN: [id number] DOB: 02-Sep-1940 Referring Physician: Sheryle Spray (Profile Not Attached) Date of Service: 02/24/2022 [facility], [place]                                                        End Of Treatment Note  Diagnoses: C61-Malignant neoplasm of prostate  Cancer Staging: 82 y.o. gentleman with Stage T1c adenocarcinoma of the prostate with Gleason score of 4+3, and PSA of 20.74.    Intent: Curative  Radiation Treatment Dates: 01/14/2022 through 02/24/2022 Site Technique Total Dose (Gy) Dose per Fx (Gy) Completed Fx Beam Energies  Prostate: Prostate IMRT 70/70 2.5 28/28 6X   Narrative: The patient tolerated radiation therapy relatively well with modest fatigue, increased nocturia x 3, dysuria and frequency.  His LUTS were managed with Flomax daily.  Plan: The patient will receive a call in about one month from the radiation oncology department. He will continue follow up with his urologist, Dr. Rosana Hoes, as well.  ------------------------------------------------   Tyler Pita, MD [employer]: [phone]  Fax: [phone] [email or web address]  Skype  LinkedIn

## 2022-03-24 NOTE — Progress Notes (Signed)
End of treatment note successfully sent to Dr. Rosana Hoes.   Patient has follow up on 3/14 for post treatment follow up with Dr. Rosana Hoes as well.   No additional needs at this time.

## 2022-04-01 ENCOUNTER — Ambulatory Visit
Admission: RE | Admit: 2022-04-01 | Discharge: 2022-04-01 | Disposition: A | Discharge status: Home / Self Care | Payer: Medicare Other | Source: Ambulatory Visit | Attending: Nurse Practitioner | Admitting: Nurse Practitioner

## 2022-04-01 NOTE — Progress Notes (Signed)
  Radiation Oncology         [phone] ________________________________  Name: Tim Hardy MRN: [id number]  Date of Service: 04/01/2022  DOB: 20-Jun-1940  Post Treatment Telephone Note  Diagnosis:  82 y.o. gentleman with Stage T1c adenocarcinoma of the prostate with Gleason score of 4+3, and PSA of 20.74.    Intent: Curative  Radiation Treatment Dates: 01/14/2022 through 02/24/2022 Site Technique Total Dose (Gy) Dose per Fx (Gy) Completed Fx Beam Energies  Prostate: Prostate IMRT 70/70 2.5 28/28 6X   (as documented in provider EOT note)   Pre Treatment IPSS Score: 7 (as documented in the provider consult note)   The patient was not available for call today. Detailed voicemail left.  Patient will continue follow up with his urologist, Dr. Rosana Hoes. He was counseled that PSA levels will be drawn in the urology office, and was reassured that additional time is expected to improve bowel and bladder symptoms. He was encouraged to call back with concerns or questions regarding radiation.     Leandra Kern, LPN

## 2022-04-09 ENCOUNTER — Encounter: Payer: Self-pay | Admitting: Cardiology

## 2022-04-09 ENCOUNTER — Ambulatory Visit: Payer: Medicare Other | Attending: Cardiology | Admitting: Cardiology

## 2022-04-09 VITALS — BP 120/78 | HR 76 | Ht 73.0 in | Wt 236.8 lb

## 2022-04-09 DIAGNOSIS — Z952 Presence of prosthetic heart valve: Secondary | ICD-10-CM

## 2022-04-09 DIAGNOSIS — E7800 Pure hypercholesterolemia, unspecified: Secondary | ICD-10-CM

## 2022-04-09 DIAGNOSIS — I2510 Atherosclerotic heart disease of native coronary artery without angina pectoris: Secondary | ICD-10-CM

## 2022-04-09 NOTE — Patient Instructions (Signed)
Medication Instructions:   Your physician recommends that you continue on your current medications as directed. Please refer to the Current Medication list given to you today.   *If you need a refill on your cardiac medications before your next appointment, please call your pharmacy*   Lab Work:  None ordered.  If you have labs (blood work) drawn today and your tests are completely normal, you will receive your results only by: [facility] (if you have MyChart) OR A paper copy in the mail If you have any lab test that is abnormal or we need to change your treatment, we will call you to review the results.   Testing/Procedures:  None ordered.   Follow-Up: At [facility], you and your health needs are our priority.  As part of our continuing mission to provide you with exceptional heart care, we have created designated Provider Care Teams.  These Care Teams include your primary Cardiologist (physician) and Advanced Practice Providers (APPs -  Physician Assistants and Nurse Practitioners) who all work together to provide you with the care you need, when you need it.  We recommend signing up for the patient portal called "MyChart".  Sign up information is provided on this After Visit Summary.  MyChart is used to connect with patients for Virtual Visits (Telemedicine).  Patients are able to view lab/test results, encounter notes, upcoming appointments, etc.  Non-urgent messages can be sent to your provider as well.   To learn more about what you can do with MyChart, go to [email or web address].    Your next appointment:   6 month(s)  Provider:   Candee Furbish, MD

## 2022-04-09 NOTE — Progress Notes (Signed)
Cardiology Office Note:    Date:  04/09/2022   ID:  Tim Hardy, DOB [birth year], MRN [id number]  PCP:  Lavone Orn, MD  [facility] Cardiologist:  Candee Furbish, MD  [facility] Electrophysiologist:  None   Referring MD: Lavone Orn, MD    History of Present Illness:    Tim Hardy is a 82 y.o. male here for the follow-up of coronary artery disease and aortic stenosis.  Aortic valve replacement occurred in September 2013 had nonobstructive CAD on cardiac catheterization with 30 to 40% LAD.  I used to take care of his mother as well.  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  He has alpacas now as well.  Had hernia repair by Dr. Dalbert Batman in 2020.  At his last appointment, he was feeling pretty good. He stated he may "overdo it sometimes", such as when using a chainsaw to clean debris or trim trees and hedges. His main complaint at that time was arthritic pain in his hips and knees.  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  Today:  He states he is doing pretty good. However, he was concerned about his prior echo revealing a 50 mm aneurysm however, when we checked a CT scan of his aorta, it was normal diameter.  This was artifact on echocardiogram.  He does not have an aortic aneurysm.. We reviewed the results of his recent echo and CT tests in detail, which were reassuring.  He was started on losartan in May 2023.  Dr. Koleen Nimrod has increased his losartan to 50 mg twice a day.  He is doing well with this.  Blood pressure under good control.  Excellent.  Has been treated for prostate cancer with both XRT and Lupron.  Tired with Lupron.  He denies any palpitations, chest pain, shortness of breath, or peripheral edema. No lightheadedness, headaches, syncope, orthopnea, or PND.    Past Medical History:  Diagnosis Date   Asthma    Basal cell adenocarcinoma    BPH (benign prostatic hyperplasia)    Bronchitis     Cancer ([place])    skin cancer removed in August   Colon polyps    Colon, diverticulosis    Diastolic dysfunction    Dilated aortic root (HCC)    ED (erectile dysfunction)    GERD (gastroesophageal reflux disease)    Heart murmur    Hiatal hernia    History of aortic valve replacement with bioprosthetic valve    Incarcerated umbilical hernia [id number]   Mild CAD    Perennial allergic rhinitis    Shortness of breath    Thoracic ascending aortic aneurysm [facility])     Past Surgical History:  Procedure Laterality Date   AORTIC VALVE REPLACEMENT  11/03/2011   Procedure: AORTIC VALVE REPLACEMENT (AVR);  Surgeon: Melrose Nakayama, MD;  Location: [place];  Service: Open Heart Surgery;  Laterality: N/A;  Partial Sternotomy   APPENDECTOMY     CARDIAC CATHETERIZATION     COLONOSCOPY W/ POLYPECTOMY     COLONOSCOPY WITH PROPOFOL N/A 07/11/2014   Procedure: COLONOSCOPY WITH PROPOFOL;  Surgeon: Garlan Fair, MD;  Location: [facility];  Service: Endoscopy;  Laterality: N/A;   ESOPHAGOGASTRODUODENOSCOPY (EGD) WITH PROPOFOL N/A 07/11/2014   Procedure: ESOPHAGOGASTRODUODENOSCOPY (EGD) WITH PROPOFOL;  Surgeon: Garlan Fair, MD;  Location: [facility];  Service: Endoscopy;  Laterality: N/A;   FEMORAL HERNIA REPAIR     , x5   HERNIA  REPAIR Bilateral    KNEE ARTHROSCOPY Bilateral    SHOULDER ARTHROSCOPY Right    STERNOTOMY  11/03/2011   Procedure: STERNOTOMY;  Surgeon: Melrose Nakayama, MD;  Location: [place];  Service: Open Heart Surgery;  Laterality: N/A;  Partial   TONSILLECTOMY     UMBILICAL HERNIA REPAIR N/A 10/20/2018   Procedure: OPEN REPAIR UMBILICAL HERNIA WITH  MESH;  Surgeon: Fanny Skates, MD;  Location: [place];  Service: General;  Laterality: N/A;    Current Medications: Current Meds  Medication Sig   acetaminophen (TYLENOL) 500 MG tablet Take 500 mg by mouth every 6 (six) hours as needed.   ALPRAZolam (XANAX) 0.5 MG tablet Take 0.25-0.5 mg by mouth 2 (two)  times daily as needed.   aspirin EC 81 MG tablet Take 81 mg by mouth every morning.    atorvastatin (LIPITOR) 10 MG tablet Take 5 mg by mouth daily.   azithromycin (ZITHROMAX) 250 MG tablet Take 2 tabs by mouth 30-60 minutes prior to dental work   cetirizine (ZYRTEC) 10 MG tablet Take 10 mg by mouth as needed.   Cholecalciferol (VITAMIN D PO) Take 1 tablet by mouth every morning. Per patient taking 1,000 mcg   Cyanocobalamin (B-12 PO) Take 1 tablet by mouth every morning.    FLOVENT HFA 110 MCG/ACT inhaler Inhale 1 puff into the lungs 2 (two) times daily.   fluticasone (FLONASE) 50 MCG/ACT nasal spray Place 2 sprays into the nose daily as needed for allergies.    HYDROcodone-acetaminophen (NORCO) 5-325 MG tablet Take 1-2 tablets by mouth every 6 (six) hours as needed for moderate pain or severe pain.   losartan (COZAAR) 25 MG tablet SMARTSIG:1.5 Tablet(s) By Mouth Every Evening   Multiple Vitamin (MULTIVITAMIN WITH MINERALS) TABS Take 1 tablet by mouth every morning.    omeprazole (PRILOSEC) 10 MG capsule Take 1 capsule by mouth daily.   Tamsulosin HCl (FLOMAX) 0.4 MG CAPS Take 0.4 mg by mouth daily after supper.      Allergies:   Oxycontin [oxycodone hcl] and Penicillins   Social History   Socioeconomic History   Marital status: Widowed    Spouse name: Not on file   Number of children: Not on file   Years of education: Not on file   Highest education level: Not on file  Occupational History   Occupation: retired  Tobacco Use   Smoking status: Former    Types: Cigarettes    Start date: 02/04/1971    Quit date: 02/03/1972    Years since quitting: 50.2   Smokeless tobacco: Current    Types: Chew  Vaping Use   Vaping Use: Never used  Substance and Sexual Activity   Alcohol use: Yes    Comment: rare   Drug use: No   Sexual activity: Not on file  Other Topics Concern   Not on file  Social History Narrative   Retired Chief Financial Officer at [employer]    He is a Physicist, medical and enjoys  renovation log cabins     Social Determinants of Radio broadcast assistant Strain: Not on file  Food Insecurity: Not on file  Transportation Needs: Not on file  Physical Activity: Not on file  Stress: Not on file  Social Connections: Not on file     Family History: The patient's family history includes Cancer in his father; Diabetes in his sister and son; Heart attack in his mother; Hypertension in his father.  ROS:   Please see the history  of present illness.    All other systems reviewed and are negative.  EKGs/Labs/Other Studies Reviewed:    The following studies were reviewed today:  CTA Chest Aorta 07/24/21: FINDINGS: Cardiovascular: Preferential opacification of the thoracic aorta. No evidence of thoracic aortic aneurysm or dissection. Enlarged heart size. No significant pericardial effusion. At least moderate atherosclerotic plaque of the thoracic aorta. Status post aortic valve replacement. Left anterior descending coronary artery stent. The left and right pulmonary arteries are enlarged in size with limited evaluation for pulmonary embolus due to timing of contrast.   Mediastinum/Nodes: No enlarged mediastinal, hilar, or axillary lymph nodes. Thyroid gland, trachea, and esophagus demonstrate no significant findings.   Lungs/Pleura: Right lower lobe linear atelectasis versus scarring. No focal consolidation. No pulmonary nodule. No pulmonary mass. No pleural effusion. No pneumothorax.   Upper Abdomen: No acute abnormality.   Musculoskeletal:   No chest wall abnormality. Fat density lesion within the subscapularis muscle likely intra muscular lipomatous lesion measuring of to 1.4 x 1.1 x 2.6cm.   No suspicious lytic or blastic osseous lesions. No acute displaced fracture. Chronic appearing anterior wedge deformity of the T12 vertebral body with at least 40% height loss.   Review of the MIP images confirms the above findings.   IMPRESSION: 1. No acute  aortic abnormality in a patient status post aortic valve replacement and left anterior descending coronary artery stent. Aortic Atherosclerosis (ICD10-I70.0). 2. Cardiomegaly. 3. Enlarged bilateral main pulmonary artery suggestive of pulmonary hypertension. 4. No acute intrathoracic abnormality.  Echocardiogram 06/18/21: 1. Left ventricular ejection fraction, by estimation, is 65 to 70%. The  left ventricle has normal function. The left ventricle has no regional  wall motion abnormalities. There is mild left ventricular hypertrophy.  Left ventricular diastolic parameters  are consistent with Grade I diastolic dysfunction (impaired relaxation).   2. Right ventricular systolic function is low normal. The right  ventricular size is normal. There is normal pulmonary artery systolic  pressure. The estimated right ventricular systolic pressure is [id number] mmHg.   3. Left atrial size was severely dilated.   4. Right atrial size was moderately dilated.   5. The mitral valve is abnormal. Mild mitral valve regurgitation.   6. The aortic valve has been repaired/replaced. Aortic valve  regurgitation is trivial. There is a 25 mm Magna valve present in the  aortic position. Echo findings are consistent with normal structure and  function of the aortic valve prosthesis. Aortic  valve area, by VTI measures 1.50 cm. Aortic valve mean gradient measures  14.8 mmHg. Aortic valve Vmax measures 2.58 m/s.   7. Aortic dilatation noted. There is severe dilatation of the ascending  aorta, measuring 50 mm.   8. The inferior vena cava is normal in size with greater than 50%  respiratory variability, suggesting right atrial pressure of 3 mmHg.   Comparison(s): Changes from prior study are noted. 05/23/2019: LVEF 60-65%,  AOV mean gradient 13 mmHg.   Conclusion(s)/Recommendation(s): Ascending aorta measured 50 mm - would  recommended dedicated contrast CT angiogram or MRI of the aorta to further  evaluate, as this  appears to not have been previously reported.   ECHO 05/23/19:  1. Left ventricular ejection fraction, by estimation, is 60 to 65%. The  left ventricle has normal function. The left ventricle has no regional  wall motion abnormalities. Left ventricular diastolic parameters are  consistent with Grade I diastolic  dysfunction (impaired relaxation).   2. Right ventricular systolic function is normal. The right ventricular  size is  normal. There is normal pulmonary artery systolic pressure.   3. Left atrial size was moderately dilated.   4. Right atrial size was mildly dilated.   5. The mitral valve is normal in structure. Trivial mitral valve  regurgitation. No evidence of mitral stenosis.   6. Post bioprosthetic AVR 2013 no PVL/AR gradients stable since echo done  March 2019 . The aortic valve has been repaired/replaced. Aortic valve  regurgitation is not visualized.   7. Aortic dilatation noted. There is mild dilatation of the aortic root  measuring 39 mm.   8. The inferior vena cava is normal in size with greater than 50%  respiratory variability, suggesting right atrial pressure of 3 mmHg.  Gradient 13 mmHg.  Exercise Stress Test 10/16/2017: There was no ST segment deviation noted during stress. This is a low risk study.   1. No evidence for ischemia by ST segment analysis, but there were PVCs pre-test, during stress, and during recovery.  2. Study was not gated.  3. Fixed small, mild mid-apical inferior perfusion defect.  No evidence for ischemia.  Possible diaphragmatic attenuation, cannot rule out prior small infarction as cannot assess wall motion.    Low risk study.   EKG: EKG is personally reviewed.  09/05/21: EKG was not ordered.  11/23/2020: Sinus rhythm. Rate 78 bpm. Left anterior fascicular block. 12/13/2019: sinus rhythm 83 nonspecific interventricular conduction delay.  Recent Labs: 06/04/2021: ALT 21; BUN 10; Creatinine, Ser 0.93; Hemoglobin 14.9; Platelets 229; Potassium  4.0; Sodium 143; TSH 1.200   Recent Lipid Panel    Component Value Date/Time   CHOL 115 11/09/2019 0818   TRIG 153 (H) 11/09/2019 0818   HDL 32 (L) 11/09/2019 0818   CHOLHDL 3.6 11/09/2019 0818   CHOLHDL 3.9 10/15/2015 0811   VLDL 28 10/15/2015 0811   LDLCALC 57 11/09/2019 0818     Physical Exam:     VS:  BP 120/78   Pulse 76   Ht '6\' 1"'$  (1.854 m)   Wt 236 lb 12.8 oz (107.4 kg)   SpO2 97%   BMI 31.24 kg/m     Wt Readings from Last 3 Encounters:  04/09/22 236 lb 12.8 oz (107.4 kg)  10/30/21 232 lb 3.2 oz (105.3 kg)  09/05/21 229 lb (103.9 kg)     GEN: Well nourished, well developed in no acute distress HEENT: Normal NECK: No JVD; No carotid bruits LYMPHATICS: No lymphadenopathy CARDIAC: RRR, 2/6 systolic murmur, rubs, gallops RESPIRATORY:  Clear to auscultation without rales, wheezing or rhonchi  ABDOMEN: Soft, non-tender, non-distended MUSCULOSKELETAL:  No edema; No deformity  SKIN: Warm and dry NEUROLOGIC:  Alert and oriented x 3 PSYCHIATRIC:  Normal affect    ASSESSMENT:    1. CAD in native artery   2. H/O aortic valve replacement   3. Pure hypercholesterolemia       PLAN:    In order of problems listed above:  Prostate cancer Lupron, 28 XRT treatment, Dr. Tammi Klippel.   Aortic valve replacement secondary to severe aortic stenosis - Doing well.  Echocardiogram reviewed.  Dental prophylaxis, aspirin 81 mg.  Note, he does not have aortic aneurysm.  50 mm was artifact measurement from echocardiogram.  CT scan verified normal diameter.  Hypertension - On losartan, 50 mg BID.  Hyperlipidemia - Continue with low-dose atorvastatin 5 mg a day.  63 LDL.  Excellent.  Creatinine 1.0 hemoglobin A1c 5.7 TSH 1.2  Coronary artery disease - Mild nonobstructive disease of LAD seen on catheterization in 2013 prior to  his aortic valve surgery.  Continue with statin, aspirin.   Shared Decision Making/Informed Consent     Follow-up:  6 months.  Medication  Adjustments/Labs and Tests Ordered: Current medicines are reviewed at length with the patient today.  Concerns regarding medicines are outlined above.   No orders of the defined types were placed in this encounter.   No orders of the defined types were placed in this encounter.   Patient Instructions  Medication Instructions:   Your physician recommends that you continue on your current medications as directed. Please refer to the Current Medication list given to you today.   *If you need a refill on your cardiac medications before your next appointment, please call your pharmacy*   Lab Work:  None ordered.  If you have labs (blood work) drawn today and your tests are completely normal, you will receive your results only by: [place] (if you have MyChart) OR A paper copy in the mail If you have any lab test that is abnormal or we need to change your treatment, we will call you to review the results.   Testing/Procedures:  None ordered.   Follow-Up: At [facility], you and your health needs are our priority.  As part of our continuing mission to provide you with exceptional heart care, we have created designated Provider Care Teams.  These Care Teams include your primary Cardiologist (physician) and Advanced Practice Providers (APPs -  Physician Assistants and Nurse Practitioners) who all work together to provide you with the care you need, when you need it.  We recommend signing up for the patient portal called "MyChart".  Sign up information is provided on this After Visit Summary.  MyChart is used to connect with patients for Virtual Visits (Telemedicine).  Patients are able to view lab/test results, encounter notes, upcoming appointments, etc.  Non-urgent messages can be sent to your provider as well.   To learn more about what you can do with MyChart, go to [email or web address].    Your next appointment:   6 month(s)  Provider:   Candee Furbish, MD         Signed, Candee Furbish, MD  04/09/2022 3:20 PM    [place]

## 2022-04-18 ENCOUNTER — Ambulatory Visit: Payer: Medicare Other | Admitting: Cardiology

## 2022-04-29 ENCOUNTER — Encounter: Payer: Self-pay | Admitting: *Deleted

## 2022-04-29 ENCOUNTER — Inpatient Hospital Stay: Payer: Medicare Other | Attending: Nurse Practitioner | Admitting: *Deleted

## 2022-04-29 DIAGNOSIS — C61 Malignant neoplasm of prostate: Secondary | ICD-10-CM

## 2022-04-29 NOTE — Progress Notes (Signed)
SCP reviewed and completed. 

## 2022-04-30 DIAGNOSIS — L821 Other seborrheic keratosis: Secondary | ICD-10-CM | POA: Diagnosis not present

## 2022-04-30 DIAGNOSIS — Z09 Encounter for follow-up examination after completed treatment for conditions other than malignant neoplasm: Secondary | ICD-10-CM | POA: Diagnosis not present

## 2022-04-30 DIAGNOSIS — Z08 Encounter for follow-up examination after completed treatment for malignant neoplasm: Secondary | ICD-10-CM | POA: Diagnosis not present

## 2022-04-30 DIAGNOSIS — Z85828 Personal history of other malignant neoplasm of skin: Secondary | ICD-10-CM | POA: Diagnosis not present

## 2022-04-30 DIAGNOSIS — L570 Actinic keratosis: Secondary | ICD-10-CM | POA: Diagnosis not present

## 2022-04-30 DIAGNOSIS — D225 Melanocytic nevi of trunk: Secondary | ICD-10-CM | POA: Diagnosis not present

## 2022-04-30 DIAGNOSIS — L814 Other melanin hyperpigmentation: Secondary | ICD-10-CM | POA: Diagnosis not present

## 2022-05-09 DIAGNOSIS — M7062 Trochanteric bursitis, left hip: Secondary | ICD-10-CM | POA: Diagnosis not present

## 2022-05-09 DIAGNOSIS — M1712 Unilateral primary osteoarthritis, left knee: Secondary | ICD-10-CM | POA: Diagnosis not present

## 2022-07-21 DIAGNOSIS — Z0000 Encounter for general adult medical examination without abnormal findings: Secondary | ICD-10-CM | POA: Diagnosis not present

## 2022-07-21 DIAGNOSIS — E7800 Pure hypercholesterolemia, unspecified: Secondary | ICD-10-CM | POA: Diagnosis not present

## 2022-07-21 DIAGNOSIS — Z79899 Other long term (current) drug therapy: Secondary | ICD-10-CM | POA: Diagnosis not present

## 2022-07-21 DIAGNOSIS — R7309 Other abnormal glucose: Secondary | ICD-10-CM | POA: Diagnosis not present

## 2022-07-21 DIAGNOSIS — R7303 Prediabetes: Secondary | ICD-10-CM | POA: Diagnosis not present

## 2022-07-21 DIAGNOSIS — Z1211 Encounter for screening for malignant neoplasm of colon: Secondary | ICD-10-CM | POA: Diagnosis not present

## 2022-07-21 DIAGNOSIS — J45998 Other asthma: Secondary | ICD-10-CM | POA: Diagnosis not present

## 2022-07-21 DIAGNOSIS — K219 Gastro-esophageal reflux disease without esophagitis: Secondary | ICD-10-CM | POA: Diagnosis not present

## 2022-07-30 DIAGNOSIS — D72829 Elevated white blood cell count, unspecified: Secondary | ICD-10-CM | POA: Diagnosis not present

## 2022-07-30 DIAGNOSIS — Z1211 Encounter for screening for malignant neoplasm of colon: Secondary | ICD-10-CM | POA: Diagnosis not present

## 2022-08-05 ENCOUNTER — Ambulatory Visit: Payer: Medicare Other | Admitting: Cardiology

## 2022-08-05 DIAGNOSIS — J309 Allergic rhinitis, unspecified: Secondary | ICD-10-CM | POA: Diagnosis not present

## 2022-08-05 DIAGNOSIS — D72829 Elevated white blood cell count, unspecified: Secondary | ICD-10-CM | POA: Diagnosis not present

## 2022-08-25 ENCOUNTER — Telehealth: Payer: Self-pay | Admitting: Internal Medicine

## 2022-08-25 NOTE — Telephone Encounter (Signed)
Contacted patient to scheduled appointments. Patient is aware of appointments that are scheduled.   

## 2022-08-27 ENCOUNTER — Inpatient Hospital Stay: Payer: Medicare Other | Admitting: Internal Medicine

## 2022-08-27 ENCOUNTER — Inpatient Hospital Stay: Payer: Medicare Other

## 2022-09-05 ENCOUNTER — Other Ambulatory Visit: Payer: Self-pay | Admitting: Physician Assistant

## 2022-09-05 DIAGNOSIS — D72829 Elevated white blood cell count, unspecified: Secondary | ICD-10-CM

## 2022-09-05 NOTE — Progress Notes (Unsigned)
[facility] [phone]   [phone]  CONSULT NOTE  REFERRING PHYSICIAN: Dr. Orson Aloe  REASON FOR CONSULTATION:  Leukocytosis   HPI Tim Hardy is a 82 y.o. male a past medical history significant for aortic valve disorder, coronary artery disease, asthma, allergies, GERD, BPH, prostate cancer diagnosed in 2023 stage IIIa (follows Dr. Kathrynn Running), and hypercholesterolemia is referred to the clinic for leukocytosis.  The patient was referred by his primary care provider Dr. Orson Aloe.  He is following up for a routine 35-month follow-up visit and hypertension management.  The patient was referred for persistent leukocytosis with lymphocyte predominance without any clear evidence of infection. His CBC from 07/22/2022 showed elevated WBC of 17.8, Hbg of 13, MCV of 94.3 ANC of 5.6, elevated lymphocytes at 11.10.    CBC showed elevated white blood cell count at 17.8, hemoglobin normal of 13, MCV   Of note, the patient was just getting over a sinus infection around the time he had his labs drawn.  He also was given steroids for his sinus infection.    The oldest of records able to me are from 2013.  His hemoglobin was normal on 10/29/2021 but looks like the patient may have been hospitalized at this time and did have some elevated white count for the remainder of the hospitalization which then normalized.  The patient's white blood cell count is normal at 9.6 in 2019 and 2020.  Patient's white blood cell count was 12.2 on 06/04/2021 without a differential.     Overall, the patient is feeling in his normal state of health today except for ***. He is currently no longer on steroids. Lupron ***? Denies hormone use. Asthma and inhalers.  He denies any frequent infections per se but ***. He denies allergies. Denies any fevers.  Denies any chills or night sweats.  Denies any lymphadenopathy.  Denies any unexplained weight loss.  Denies any signs and symptoms of  infection at this time.  Denies any rashes.  Denies any nausea or vomiting.  Denies any lymphadenopathy.  Denies any abnormal bleeding. Denies any usual stress. Colonoscopy in 2016 with recommendation for repeat 5 years but he declined had FOBT/FIT testing performed instead.   HPI  Past Medical History:  Diagnosis Date   Asthma    Basal cell adenocarcinoma    BPH (benign prostatic hyperplasia)    Bronchitis    Cancer (HCC)    skin cancer removed in August   Colon polyps    Colon, diverticulosis    Diastolic dysfunction    Dilated aortic root (HCC)    ED (erectile dysfunction)    GERD (gastroesophageal reflux disease)    Heart murmur    Hiatal hernia    History of aortic valve replacement with bioprosthetic valve    Incarcerated umbilical hernia 10/20/2018   Mild CAD    Perennial allergic rhinitis    Shortness of breath    Thoracic ascending aortic aneurysm [facility])     Past Surgical History:  Procedure Laterality Date   AORTIC VALVE REPLACEMENT  11/03/2011   Procedure: AORTIC VALVE REPLACEMENT (AVR);  Surgeon: Loreli Slot, MD;  Location: [facility] OR;  Service: Open Heart Surgery;  Laterality: N/A;  Partial Sternotomy   APPENDECTOMY     CARDIAC CATHETERIZATION     COLONOSCOPY W/ POLYPECTOMY     COLONOSCOPY WITH PROPOFOL N/A 07/11/2014   Procedure: COLONOSCOPY WITH PROPOFOL;  Surgeon: Charolett Bumpers, MD;  Location: [facility];  Service: Endoscopy;  Laterality: N/A;   ESOPHAGOGASTRODUODENOSCOPY (EGD) WITH PROPOFOL N/A 07/11/2014   Procedure: ESOPHAGOGASTRODUODENOSCOPY (EGD) WITH PROPOFOL;  Surgeon: Charolett Bumpers, MD;  Location: [facility];  Service: Endoscopy;  Laterality: N/A;   FEMORAL HERNIA REPAIR     , x5   HERNIA REPAIR Bilateral    KNEE ARTHROSCOPY Bilateral    SHOULDER ARTHROSCOPY Right    STERNOTOMY  11/03/2011   Procedure: STERNOTOMY;  Surgeon: Loreli Slot, MD;  Location: [facility] OR;  Service: Open Heart Surgery;  Laterality: N/A;  Partial   TONSILLECTOMY      UMBILICAL HERNIA REPAIR N/A 10/20/2018   Procedure: OPEN REPAIR UMBILICAL HERNIA WITH  MESH;  Surgeon: Claud Kelp, MD;  Location: [facility];  Service: General;  Laterality: N/A;    Family History  Problem Relation Age of Onset   Cancer Father        lung and colon   Hypertension Father    Heart attack Mother    Diabetes Sister    Diabetes Son     Social History Social History   Tobacco Use   Smoking status: Former    Current packs/day: 0.00    Types: Cigarettes    Start date: 02/04/1971    Quit date: 02/03/1972    Years since quitting: 50.6   Smokeless tobacco: Current    Types: Chew  Vaping Use   Vaping status: Never Used  Substance Use Topics   Alcohol use: Yes    Comment: rare   Drug use: No    Allergies  Allergen Reactions   Oxycontin [Oxycodone Hcl] Other (See Comments)    Makes patient not himself.   Penicillins Rash    Current Outpatient Medications  Medication Sig Dispense Refill   acetaminophen (TYLENOL) 500 MG tablet Take 500 mg by mouth every 6 (six) hours as needed.     ALPRAZolam (XANAX) 0.5 MG tablet Take 0.25-0.5 mg by mouth 2 (two) times daily as needed. (Patient not taking: Reported on 04/29/2022)     aspirin EC 81 MG tablet Take 81 mg by mouth every morning.      atorvastatin (LIPITOR) 10 MG tablet Take 5 mg by mouth daily.     azithromycin (ZITHROMAX) 250 MG tablet Take 2 tabs by mouth 30-60 minutes prior to dental work (Patient not taking: Reported on 04/29/2022) 2 each 1   cetirizine (ZYRTEC) 10 MG tablet Take 10 mg by mouth as needed.     Cholecalciferol (VITAMIN D PO) Take 1 tablet by mouth every morning. Per patient taking 1,000 mcg     Cyanocobalamin (B-12 PO) Take 1 tablet by mouth every morning.      FLOVENT HFA 110 MCG/ACT inhaler Inhale 1 puff into the lungs 2 (two) times daily.     fluticasone (FLONASE) 50 MCG/ACT nasal spray Place 2 sprays into the nose daily as needed for allergies.  (Patient not taking: Reported  on 04/29/2022)     HYDROcodone-acetaminophen (NORCO) 5-325 MG tablet Take 1-2 tablets by mouth every 6 (six) hours as needed for moderate pain or severe pain. (Patient not taking: Reported on 04/29/2022) 20 tablet 0   losartan (COZAAR) 50 MG tablet Take 50 mg by mouth 2 (two) times daily.  Pt takes 1 tablet in morning and 1 tablet at night.     Multiple Vitamin (MULTIVITAMIN WITH MINERALS) TABS Take 1 tablet by mouth every morning.      omeprazole (PRILOSEC) 10 MG capsule Take 1 capsule by mouth daily.  Tamsulosin HCl (FLOMAX) 0.4 MG CAPS Take 0.4 mg by mouth daily after supper.      No current facility-administered medications for this visit.    REVIEW OF SYSTEMS:   Review of Systems  Constitutional: Negative for appetite change, chills, fatigue, fever and unexpected weight change.  HENT:   Negative for mouth sores, nosebleeds, sore throat and trouble swallowing.   Eyes: Negative for eye problems and icterus.  Respiratory: Negative for cough, hemoptysis, shortness of breath and wheezing.   Cardiovascular: Negative for chest pain and leg swelling.  Gastrointestinal: Negative for abdominal pain, constipation, diarrhea, nausea and vomiting.  Genitourinary: Negative for bladder incontinence, difficulty urinating, dysuria, frequency and hematuria.   Musculoskeletal: Negative for back pain, gait problem, neck pain and neck stiffness.  Skin: Negative for itching and rash.  Neurological: Negative for dizziness, extremity weakness, gait problem, headaches, light-headedness and seizures.  Hematological: Negative for adenopathy. Does not bruise/bleed easily.  Psychiatric/Behavioral: Negative for confusion, depression and sleep disturbance. The patient is not nervous/anxious.     PHYSICAL EXAMINATION:  There were no vitals taken for this visit.  ECOG PERFORMANCE STATUS: {CHL ONC ECOG [id number]  Physical Exam  Constitutional: Oriented to person, place, and time and well-developed,  well-nourished, and in no distress. No distress.  HENT:  Head: Normocephalic and atraumatic.  Mouth/Throat: Oropharynx is clear and moist. No oropharyngeal exudate.  Eyes: Conjunctivae are normal. Right eye exhibits no discharge. Left eye exhibits no discharge. No scleral icterus.  Neck: Normal range of motion. Neck supple.  Cardiovascular: Normal rate, regular rhythm, normal heart sounds and intact distal pulses.   Pulmonary/Chest: Effort normal and breath sounds normal. No respiratory distress. No wheezes. No rales.  Abdominal: Soft. Bowel sounds are normal. Exhibits no distension and no mass. There is no tenderness.  Musculoskeletal: Normal range of motion. Exhibits no edema.  Lymphadenopathy:    No cervical adenopathy.  Neurological: Alert and oriented to person, place, and time. Exhibits normal muscle tone. Gait normal. Coordination normal.  Skin: Skin is warm and dry. No rash noted. Not diaphoretic. No erythema. No pallor.  Psychiatric: Mood, memory and judgment normal.  Vitals reviewed.  LABORATORY DATA: Lab Results  Component Value Date   WBC 12.2 (H) 06/04/2021   HGB 14.9 06/04/2021   HCT 43.0 06/04/2021   MCV 90 06/04/2021   PLT 229 06/04/2021      Chemistry      Component Value Date/Time   NA 143 06/04/2021 1409   K 4.0 06/04/2021 1409   CL 105 06/04/2021 1409   CO2 24 06/04/2021 1409   BUN 10 06/04/2021 1409   CREATININE 0.93 06/04/2021 1409   CREATININE 1.14 10/15/2015 0811      Component Value Date/Time   CALCIUM 9.4 06/04/2021 1409   ALKPHOS 55 06/04/2021 1409   AST 20 06/04/2021 1409   ALT 21 06/04/2021 1409   BILITOT 1.2 06/04/2021 1409       RADIOGRAPHIC STUDIES: No results found.  ASSESSMENT: Very pleasant 82 year old male referred to the clinic for leukocytosis, lymphocyte predominant.  The patient had several labs performed today including a CBC, CMP, LDH, BCR/abl, flow cytometry performed today.  The patient was seen with Dr. Arbutus Ped  today.  The patient CBC showed ***  We will arrange for  Follow-up  The patient voices understanding of current disease status and treatment options and is in agreement with the current care plan.  All questions were answered. The patient knows to call the clinic with any problems, questions  or concerns. We can certainly see the patient much sooner if necessary.  Thank you so much for allowing me to participate in the care of Tim Hardy. I will continue to follow up the patient with you and assist in his care.  I spent {CHL ONC TIME VISIT - YQIHK:[id number]} counseling the patient face to face. The total time spent in the appointment was {CHL ONC TIME VISIT - FIEPP:[id number]}.  Disclaimer: This note was dictated with voice recognition software. Similar sounding words can inadvertently be transcribed and may not be corrected upon review.    L  September 05, 2022, 8:13 AM

## 2022-09-08 ENCOUNTER — Other Ambulatory Visit: Payer: Self-pay

## 2022-09-08 ENCOUNTER — Inpatient Hospital Stay: Payer: Medicare Other | Attending: Physician Assistant

## 2022-09-08 ENCOUNTER — Inpatient Hospital Stay: Payer: Medicare Other | Admitting: Physician Assistant

## 2022-09-08 VITALS — BP 131/82 | HR 80 | Temp 98.5°F | Resp 16 | Ht 73.0 in | Wt 230.5 lb

## 2022-09-08 DIAGNOSIS — E7800 Pure hypercholesterolemia, unspecified: Secondary | ICD-10-CM | POA: Insufficient documentation

## 2022-09-08 DIAGNOSIS — Z8546 Personal history of malignant neoplasm of prostate: Secondary | ICD-10-CM | POA: Insufficient documentation

## 2022-09-08 DIAGNOSIS — N400 Enlarged prostate without lower urinary tract symptoms: Secondary | ICD-10-CM | POA: Insufficient documentation

## 2022-09-08 DIAGNOSIS — D72829 Elevated white blood cell count, unspecified: Secondary | ICD-10-CM | POA: Insufficient documentation

## 2022-09-08 DIAGNOSIS — J45909 Unspecified asthma, uncomplicated: Secondary | ICD-10-CM | POA: Insufficient documentation

## 2022-09-08 DIAGNOSIS — K219 Gastro-esophageal reflux disease without esophagitis: Secondary | ICD-10-CM | POA: Insufficient documentation

## 2022-09-08 DIAGNOSIS — W57XXXS Bitten or stung by nonvenomous insect and other nonvenomous arthropods, sequela: Secondary | ICD-10-CM

## 2022-09-08 DIAGNOSIS — I2510 Atherosclerotic heart disease of native coronary artery without angina pectoris: Secondary | ICD-10-CM | POA: Insufficient documentation

## 2022-09-08 DIAGNOSIS — D72820 Lymphocytosis (symptomatic): Secondary | ICD-10-CM

## 2022-09-08 LAB — CBC WITH DIFFERENTIAL (CANCER CENTER ONLY)
Abs Immature Granulocytes: 0.07 10*3/uL (ref 0.00–0.07)
Basophils Absolute: 0.1 10*3/uL (ref 0.0–0.1)
Basophils Relative: 0 %
Eosinophils Absolute: 0.2 10*3/uL (ref 0.0–0.5)
Eosinophils Relative: 1 %
HCT: 39.1 % (ref 39.0–52.0)
Hemoglobin: 13.4 g/dL (ref 13.0–17.0)
Immature Granulocytes: 0 %
Lymphocytes Relative: 55 %
Lymphs Abs: 8.9 10*3/uL — ABNORMAL HIGH (ref 0.7–4.0)
MCH: 32.8 pg (ref 26.0–34.0)
MCHC: 34.3 g/dL (ref 30.0–36.0)
MCV: 95.8 fL (ref 80.0–100.0)
Monocytes Absolute: 0.8 10*3/uL (ref 0.1–1.0)
Monocytes Relative: 5 %
Neutro Abs: 6.3 10*3/uL (ref 1.7–7.7)
Neutrophils Relative %: 39 %
Platelet Count: 184 10*3/uL (ref 150–400)
RBC: 4.08 MIL/uL — ABNORMAL LOW (ref 4.22–5.81)
RDW: 13 % (ref 11.5–15.5)
Smear Review: NORMAL
WBC Count: 16.4 10*3/uL — ABNORMAL HIGH (ref 4.0–10.5)
nRBC: 0 % (ref 0.0–0.2)

## 2022-09-08 LAB — CMP (CANCER CENTER ONLY)
ALT: 19 U/L (ref 0–44)
AST: 18 U/L (ref 15–41)
Albumin: 4.1 g/dL (ref 3.5–5.0)
Alkaline Phosphatase: 54 U/L (ref 38–126)
Anion gap: 6 (ref 5–15)
BUN: 21 mg/dL (ref 8–23)
CO2: 28 mmol/L (ref 22–32)
Calcium: 9.1 mg/dL (ref 8.9–10.3)
Chloride: 106 mmol/L (ref 98–111)
Creatinine: 0.94 mg/dL (ref 0.61–1.24)
GFR, Estimated: >60 mL/min (ref >60)
Glucose, Bld: 117 mg/dL — ABNORMAL HIGH (ref 70–99)
Potassium: 4.4 mmol/L (ref 3.5–5.1)
Sodium: 140 mmol/L (ref 135–145)
Total Bilirubin: 1 mg/dL (ref 0.3–1.2)
Total Protein: 5.9 g/dL — ABNORMAL LOW (ref 6.5–8.1)

## 2022-09-08 LAB — LACTATE DEHYDROGENASE: LDH: 188 U/L (ref 98–192)

## 2022-09-08 MED ORDER — DOXYCYCLINE HYCLATE 100 MG PO TABS
100.0000 mg | ORAL_TABLET | Freq: Two times a day (BID) | ORAL | 0 refills | Status: AC
Start: 2022-09-08 — End: ?

## 2022-09-09 LAB — SURGICAL PATHOLOGY

## 2022-09-10 ENCOUNTER — Telehealth: Payer: Self-pay | Admitting: Physician Assistant

## 2022-09-10 NOTE — Telephone Encounter (Signed)
I called the patient to review the results of his flow cytometry.  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  I will call pathology tomorrow to ensure that they are running this test as a reflex and that I do not need to call the patient back for additional lab work.  I called the patient and let him know the findings.  I reviewed with Dr. Arbutus Ped who recommends keeping his lab appointment and follow-up in early October as scheduled for close monitoring.    Discussed with the patient that CLL typically is a leukemia slow to progress and does not usually cause symptoms in the early stages and that typically patients do not require any treatment in the early stages.  We reviewed symptoms to monitor for such as lymphadenopathy, fevers, night sweats, unexplained weight loss, fatigue, and drops in the blood count.  I will mail information about CLL to the patient and I will also call his wife later this week to discuss his with her as well per patient request.

## 2022-09-15 ENCOUNTER — Other Ambulatory Visit: Payer: Self-pay | Admitting: Physician Assistant

## 2022-09-15 DIAGNOSIS — C9110 Chronic lymphocytic leukemia of B-cell type not having achieved remission: Secondary | ICD-10-CM

## 2022-09-15 NOTE — Progress Notes (Signed)
I called the pathology lab to see if they had enough sample to run the recommended FISH analysis (T11; 14) to rule out less likely cause of leukocytosis such as atypical mantle cell lymphoma.  They do not have enough sample to run the recommended test by the pathologist.  Therefore, they need another of his peripheral blood sample.  I placed the order and called the patient's significant other to let them know that we would arrange for a lab only visit on 8/14 at 2 PM. I called and spoke to flow in pathology who mentions they will make note of this for when they receive his sample

## 2022-09-17 ENCOUNTER — Other Ambulatory Visit: Payer: Self-pay

## 2022-09-17 ENCOUNTER — Inpatient Hospital Stay: Payer: Medicare Other

## 2022-09-17 DIAGNOSIS — J45909 Unspecified asthma, uncomplicated: Secondary | ICD-10-CM | POA: Diagnosis not present

## 2022-09-17 DIAGNOSIS — E7800 Pure hypercholesterolemia, unspecified: Secondary | ICD-10-CM | POA: Diagnosis not present

## 2022-09-17 DIAGNOSIS — K219 Gastro-esophageal reflux disease without esophagitis: Secondary | ICD-10-CM | POA: Diagnosis not present

## 2022-09-17 DIAGNOSIS — C9110 Chronic lymphocytic leukemia of B-cell type not having achieved remission: Secondary | ICD-10-CM

## 2022-09-17 DIAGNOSIS — I2510 Atherosclerotic heart disease of native coronary artery without angina pectoris: Secondary | ICD-10-CM | POA: Diagnosis not present

## 2022-09-17 DIAGNOSIS — D72829 Elevated white blood cell count, unspecified: Secondary | ICD-10-CM | POA: Diagnosis not present

## 2022-09-21 IMAGING — CT CT ANGIO CHEST
3 of 8 series · 18 of 46 positions shown · IV contrast (OMNIPAQUE 350)
Comparison: None Available.

CLINICAL DATA: Aortic aneurysm, known or suspected

EXAM:
CT ANGIOGRAPHY CHEST WITH CONTRAST
TECHNIQUE: Multidetector CT imaging of the chest was performed using the
standard protocol during bolus administration of intravenous
contrast. Multiplanar CT image reconstructions and MIPs were
obtained to evaluate the vascular anatomy.

[Series 4: aorta 3.0 bf37 2 · axial · 0.71mm/px · z∈[+1262,+1590]mm · 13 of 129 slices shown]
[im 10/129  lung]
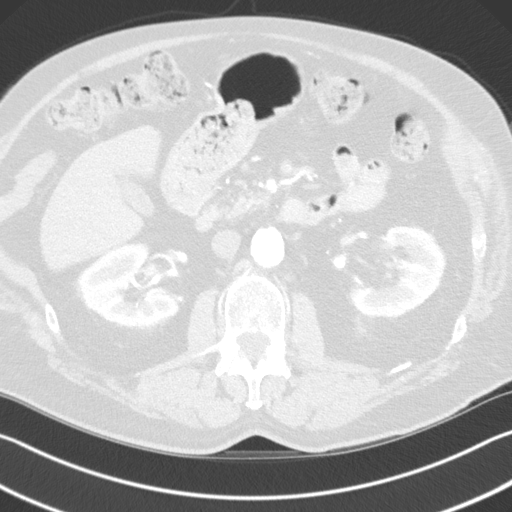
[im 19/129  soft-tissue]
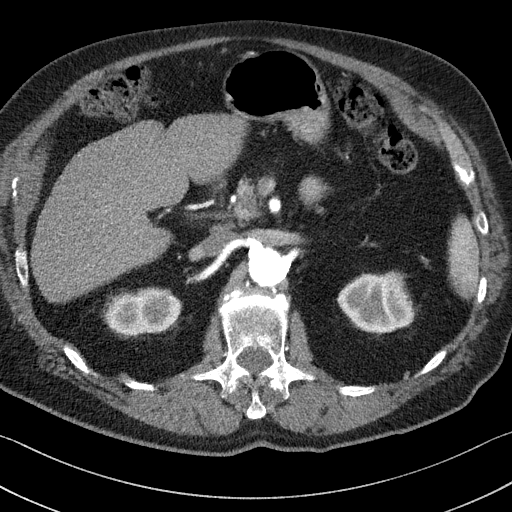
[im 28/129  lung]
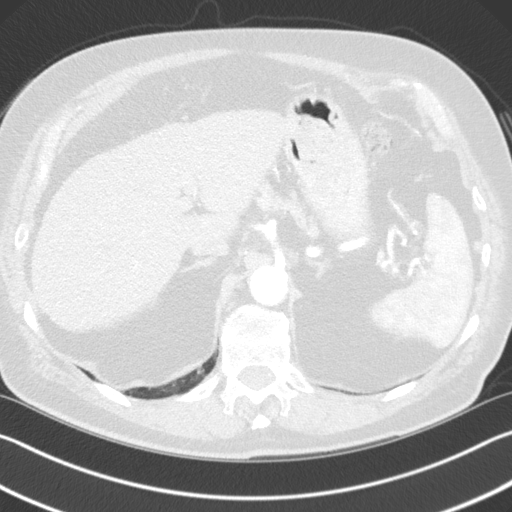
[im 37/129  soft-tissue]
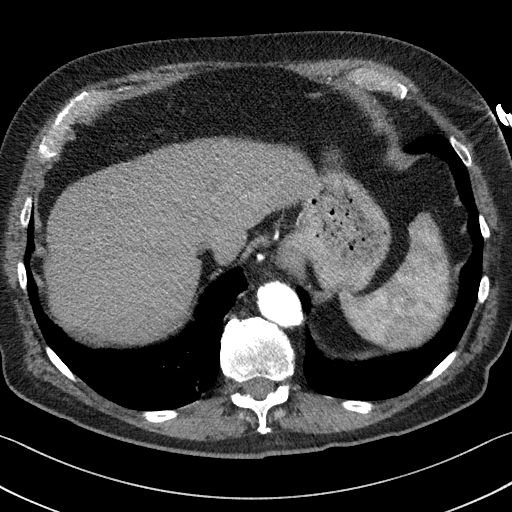
[im 46/129  lung]
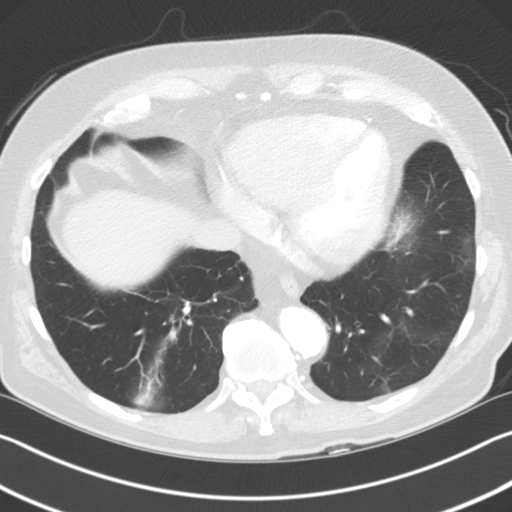
[im 55/129  soft-tissue]
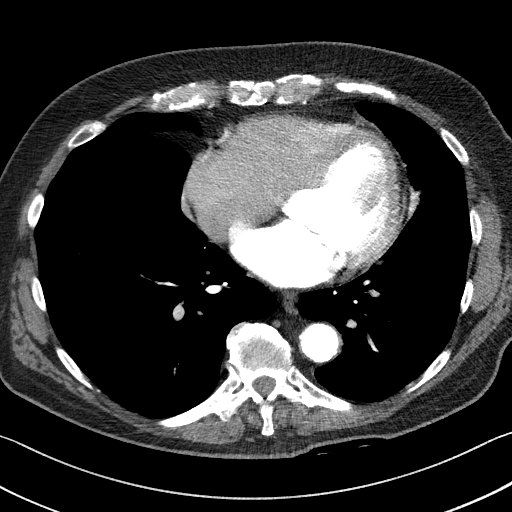
[im 65/129  lung]
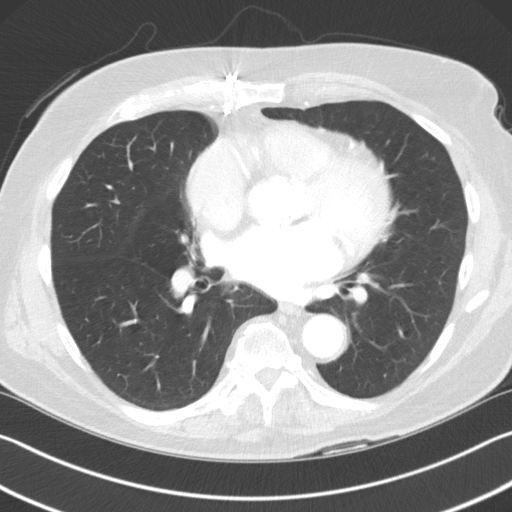
[im 74/129  soft-tissue]
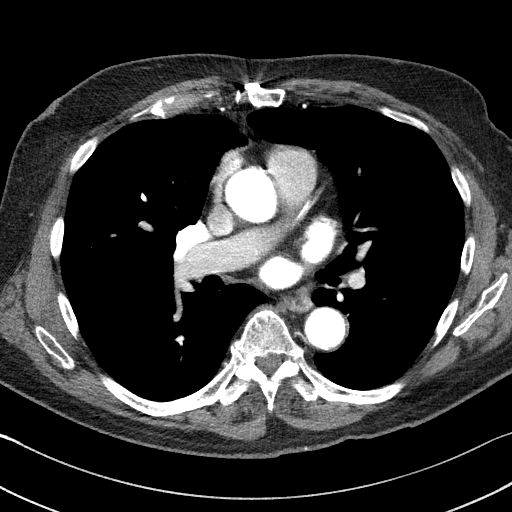
[im 83/129  lung]
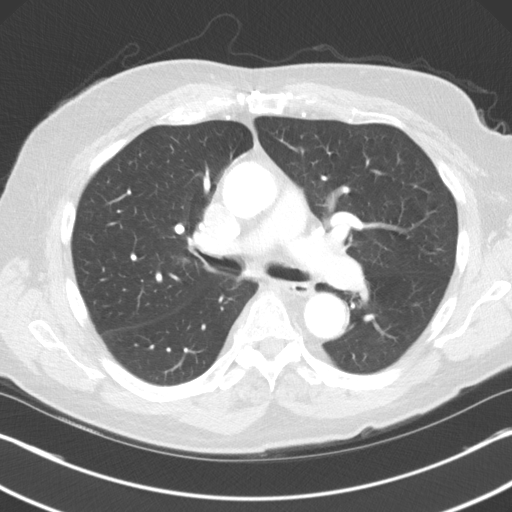
[im 92/129  soft-tissue]
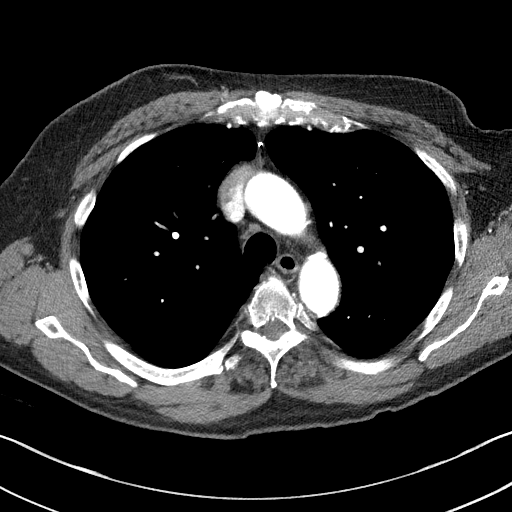
[im 101/129  lung]
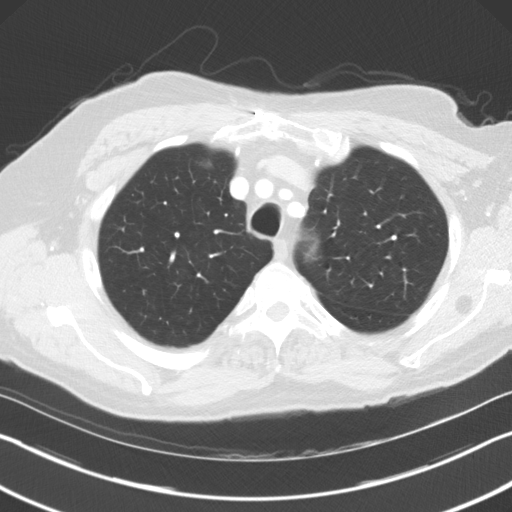
[im 110/129  soft-tissue]
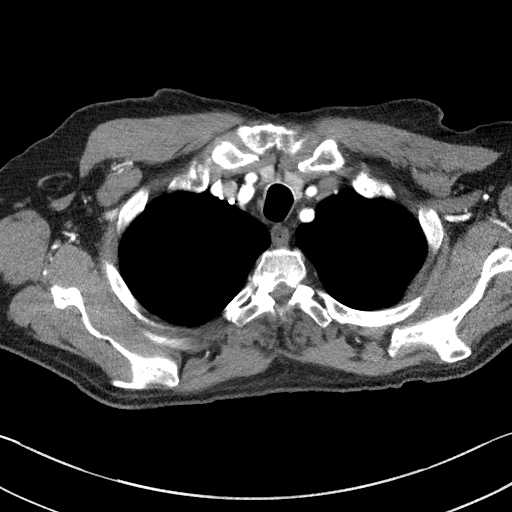
[im 119/129  lung]
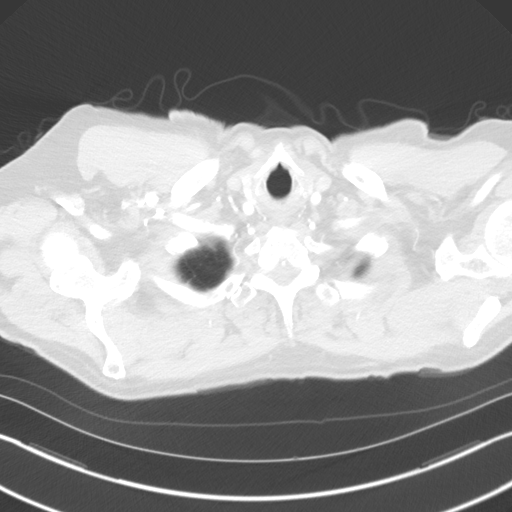

[Series 5: lung · axial · 0.71mm/px · z∈[+1262,+1316]mm · 2 of 129 slices shown]
[im 10/129  soft-tissue]
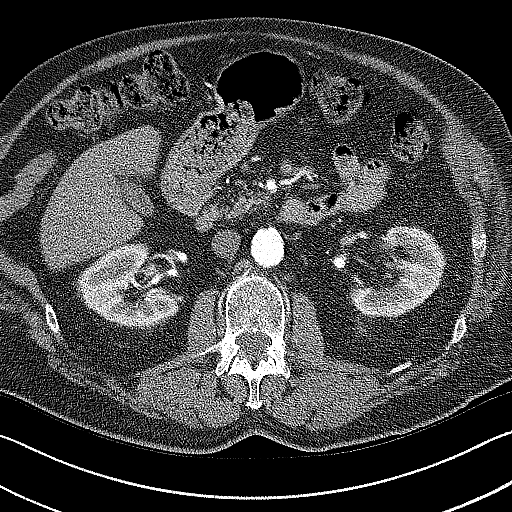
[im 28/129  soft-tissue]
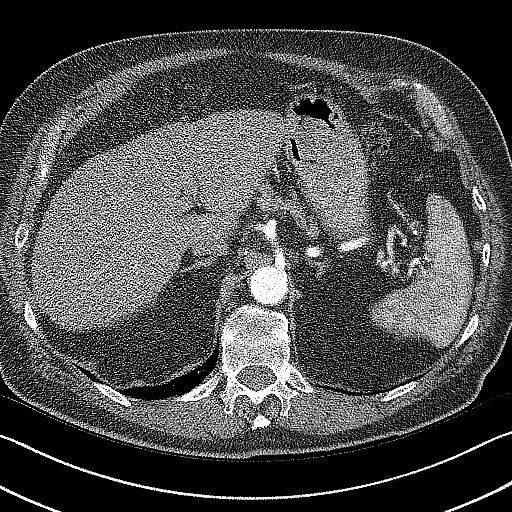

[Series 7: coronals · coronal · 0.74mm/px · 3 of 140 slices shown]
[im 35/140  soft-tissue]
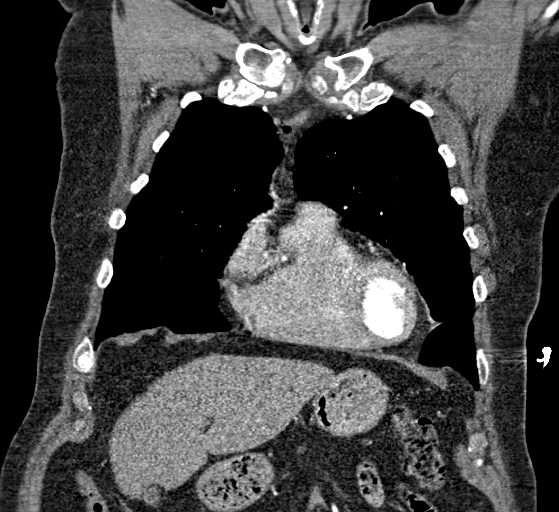
[im 70/140  soft-tissue]
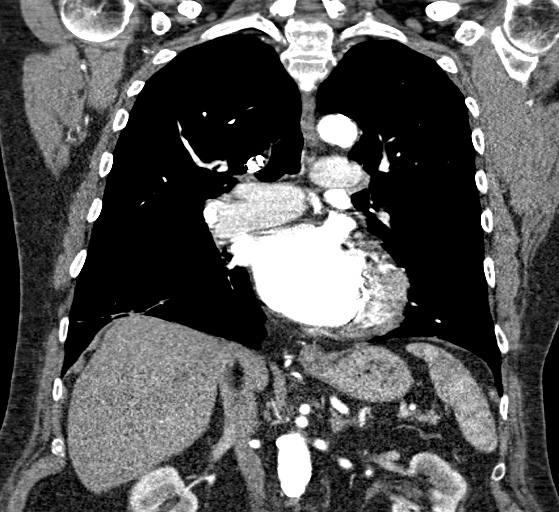
[im 105/140  soft-tissue]
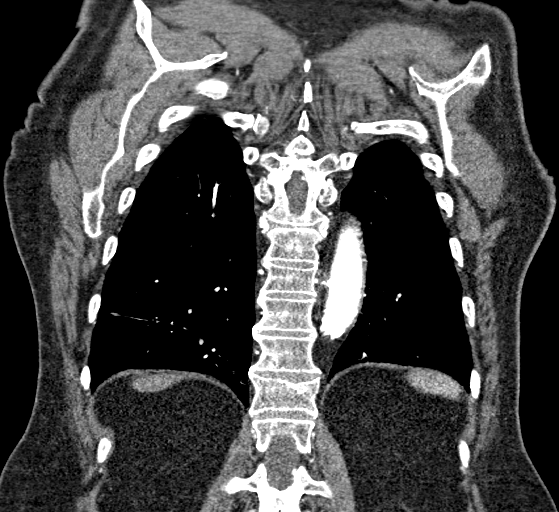

[18 of 46 positions shown; findings below may reference images not displayed]

RADIATION DOSE REDUCTION: This exam was performed according to the
departmental dose-optimization program which includes automated
exposure control, adjustment of the mA and/or kV according to
patient size and/or use of iterative reconstruction technique.

CONTRAST:  80mL OMNIPAQUE IOHEXOL 350 MG/ML SOLN
FINDINGS: Cardiovascular: Preferential opacification of the thoracic aorta. No
evidence of thoracic aortic aneurysm or dissection. Enlarged heart
size. No significant pericardial effusion. At least moderate
atherosclerotic plaque of the thoracic aorta. Status post aortic
valve replacement. Left anterior descending coronary artery stent.
The left and right pulmonary arteries are enlarged in size with
limited evaluation for pulmonary embolus due to timing of contrast.

Mediastinum/Nodes: No enlarged mediastinal, hilar, or axillary lymph
nodes. Thyroid gland, trachea, and esophagus demonstrate no
significant findings.

Lungs/Pleura: Right lower lobe linear atelectasis versus scarring.
No focal consolidation. No pulmonary nodule. No pulmonary mass. No
pleural effusion. No pneumothorax.

Upper Abdomen: No acute abnormality.

Musculoskeletal:

No chest wall abnormality. Fat density lesion within the
subscapularis muscle likely intra muscular lipomatous lesion
measuring of to 1.4 x 1.1 x 2.6cm.

No suspicious lytic or blastic osseous lesions. No acute displaced
fracture. Chronic appearing anterior wedge deformity of the T12
vertebral body with at least 40% height loss.

Review of the MIP images confirms the above findings.
IMPRESSION: 1. No acute aortic abnormality in a patient status post aortic valve
replacement and left anterior descending coronary artery stent.
Aortic Atherosclerosis ([id number]-[id number].Y).
2. Cardiomegaly.
3. Enlarged bilateral main pulmonary artery suggestive of pulmonary
hypertension.
4. No acute intrathoracic abnormality.

## 2022-09-22 LAB — FLOW CYTOMETRY

## 2022-09-23 ENCOUNTER — Encounter (HOSPITAL_COMMUNITY): Payer: Self-pay | Admitting: Internal Medicine

## 2022-09-24 ENCOUNTER — Telehealth: Payer: Self-pay | Admitting: Physician Assistant

## 2022-09-24 NOTE — Telephone Encounter (Signed)
I called the patient to review his additional cytogenetic report, which was negative. We will see the patient back as planned in October to recheck his labs. He understands this is most likely CLL. I had previously mailed some information to the patient regarding this, which he received.

## 2022-10-09 DIAGNOSIS — M25562 Pain in left knee: Secondary | ICD-10-CM | POA: Diagnosis not present

## 2022-10-09 DIAGNOSIS — G8929 Other chronic pain: Secondary | ICD-10-CM | POA: Diagnosis not present

## 2022-10-09 DIAGNOSIS — M7062 Trochanteric bursitis, left hip: Secondary | ICD-10-CM | POA: Diagnosis not present

## 2022-10-13 ENCOUNTER — Encounter: Payer: Self-pay | Admitting: Cardiology

## 2022-10-13 ENCOUNTER — Ambulatory Visit: Payer: Medicare Other | Attending: Cardiology | Admitting: Cardiology

## 2022-10-13 VITALS — BP 116/74 | HR 84 | Ht 73.0 in | Wt 231.0 lb

## 2022-10-13 DIAGNOSIS — Z952 Presence of prosthetic heart valve: Secondary | ICD-10-CM | POA: Diagnosis not present

## 2022-10-13 DIAGNOSIS — I2510 Atherosclerotic heart disease of native coronary artery without angina pectoris: Secondary | ICD-10-CM

## 2022-10-13 DIAGNOSIS — Z953 Presence of xenogenic heart valve: Secondary | ICD-10-CM

## 2022-10-13 DIAGNOSIS — E7800 Pure hypercholesterolemia, unspecified: Secondary | ICD-10-CM

## 2022-10-13 NOTE — Patient Instructions (Signed)
Medication Instructions:  The current medical regimen is effective;  continue present plan and medications.  *If you need a refill on your cardiac medications before your next appointment, please call your pharmacy*  Follow-Up: At [facility], you and your health needs are our priority.  As part of our continuing mission to provide you with exceptional heart care, we have created designated Provider Care Teams.  These Care Teams include your primary Cardiologist (physician) and Advanced Practice Providers (APPs -  Physician Assistants and Nurse Practitioners) who all work together to provide you with the care you need, when you need it.  We recommend signing up for the patient portal called "MyChart".  Sign up information is provided on this After Visit Summary.  MyChart is used to connect with patients for Virtual Visits (Telemedicine).  Patients are able to view lab/test results, encounter notes, upcoming appointments, etc.  Non-urgent messages can be sent to your provider as well.   To learn more about what you can do with MyChart, go to [email or web address].    Your next appointment:   6 month(s)  Provider:   Candee Furbish, MD

## 2022-10-13 NOTE — Progress Notes (Signed)
Cardiology Office Note:  .   Date:  10/13/2022  ID:  Tim Hardy, DOB 05/31/1940, MRN [id number] PCP: Emilio Aspen, MD  [facility] Providers Cardiologist:  Donato Schultz, MD    History of Present Illness: .   Tim Hardy is a 82 y.o. male here for the follow-up of coronary artery disease and aortic stenosis.   Aortic valve replacement occurred in September 2013 had nonobstructive CAD on cardiac catheterization with 30 to 40% LAD.  I used to take care of his mother as well.   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  He has alpacas now as well.   Had hernia repair by Dr. Derrell Lolling in 2020. However, he was concerned about his prior echo revealing a 50 mm aneurysm however, when we checked a CT scan of his aorta, it was normal diameter.  This was artifact on echocardiogram.  He does not have an aortic aneurysm.. We reviewed the results of his recent echo and CT tests in detail, which were reassuring.   CLL newly dx.   Discussed the use of AI scribe software for clinical note transcription with the patient, who gave verbal consent to proceed.  History of Present Illness   Tim Hardy, a patient with a history of prostate cancer and recent diagnosis of Chronic Lymphocytic Leukemia (CLL), presents for a routine follow-up. He underwent radiation therapy for prostate cancer earlier this year and tolerated the treatment well. During a recent physical examination, his white cell count was noted to be elevated, and his red blood cell count was low. This led to a referral to a hematologist, Dr. Jerolyn Center, who diagnosed him with CLL. The patient is scheduled for a follow-up with Dr. Jerolyn Center in a month. He expresses curiosity and concern about his CLL diagnosis, asking about its commonality, potential link to his radiation treatment, and treatment options. He also mentions a past cardiac event approximately eleven years ago, for which he  continues to see the doctor for follow-ups.       ROS: No CP, no SOB  Studies Reviewed: Marland Kitchen   EKG Interpretation Date/Time:  Monday October 13 2022 14:29:41 EDT Ventricular Rate:  84 PR Interval:  164 QRS Duration:  108 QT Interval:  362 QTC Calculation: 427 R Axis:   -18  Text Interpretation: Normal sinus rhythm Incomplete left bundle branch block When compared with ECG of 14-Oct-2018 14:12, Rate faster Confirmed by Donato Schultz ([id number]) on 10/13/2022 2:52:24 PM    WBC 17  Risk Assessment/Calculations:            Physical Exam:   VS:  BP 116/74   Pulse 84   Ht 6\' 1"  (1.854 m)   Wt 231 lb (104.8 kg)   SpO2 95%   BMI 30.48 kg/m    Wt Readings from Last 3 Encounters:  10/13/22 231 lb (104.8 kg)  09/08/22 230 lb 8 oz (104.6 kg)  04/09/22 236 lb 12.8 oz (107.4 kg)    GEN: Well nourished, well developed in no acute distress NECK: No JVD; No carotid bruits CARDIAC: RRR, no murmurs, rubs, gallops RESPIRATORY:  Clear to auscultation without rales, wheezing or rhonchi  ABDOMEN: Soft, non-tender, non-distended EXTREMITIES:  No edema; No deformity   ASSESSMENT AND PLAN: .    Assessment and Plan    Chronic Lymphocytic Leukemia (CLL) Recently diagnosed. Patient is under the care of Dr. Shirline Frees for this condition. No specific cardiac implications  discussed. -Continue follow-up with Dr. Shirline Frees for CLL management.  Prostate Cancer History of radiation treatment. No current cardiac implications discussed. -Continue current management.  Cardiovascular Health/ aortic valve replacement 2013 Stable EKG. No new cardiac symptoms reported. -Continue current cardiac medications and lifestyle modifications. -Schedule follow-up appointment in 6 months (March 2025).              Signed, Donato Schultz, MD

## 2022-10-23 DIAGNOSIS — C9110 Chronic lymphocytic leukemia of B-cell type not having achieved remission: Secondary | ICD-10-CM | POA: Diagnosis not present

## 2022-10-29 NOTE — Progress Notes (Deleted)
[facility] OFFICE PROGRESS NOTE  Tim Aspen, MD 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 Kathrynn Running)   PRIOR THERAPY: None  CURRENT THERAPY: Observation   INTERVAL HISTORY: Tim Hardy 82 y.o. male returns to the clinic today for a follow up visit accompanied by his wife. The patient established care in the clinic on 09/08/22 and he was referred for leukocytosis with lymphocyte predominance without any clear evidence of infection. 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   The patient is asymptomatic and only has mildly elevated WBC without any thrombocytopenia or anemia.   Observation was recommended. The patient is feeling fairly well overall today.  Denies any fevers.  Denies any chills.  Does struggle with intermittent flashes secondary to the Lupron injections.  Denies any lymphadenopathy.  Denies any unexplained weight loss.  Denies early satiety. Denies unexplained weight loss. Denies abnormal bleeding or bruising. Denies signs or symptoms of infection. Denies any nausea or vomiting. He is here for evaluation and repeat blood work.   MEDICAL HISTORY: Past Medical History:  Diagnosis Date   Asthma    Basal cell adenocarcinoma    BPH (benign prostatic hyperplasia)    Bronchitis    Cancer (HCC)    skin cancer removed in August   Colon polyps    Colon, diverticulosis    Diastolic dysfunction    Dilated aortic root (HCC)    ED (erectile dysfunction)    GERD (gastroesophageal reflux disease)    Heart murmur    Hiatal hernia    History of aortic valve replacement with bioprosthetic valve    Incarcerated umbilical hernia 10/20/2018   Mild CAD    Perennial allergic rhinitis    Shortness of breath    Thoracic  ascending aortic aneurysm (HCC)     ALLERGIES:  is allergic to oxycontin [oxycodone hcl] and penicillins.  MEDICATIONS:  Current Outpatient Medications  Medication Sig Dispense Refill   acetaminophen (TYLENOL) 500 MG tablet Take 500 mg by mouth every 6 (six) hours as needed.     ALPRAZolam (XANAX) 0.5 MG tablet Take 0.25-0.5 mg by mouth 2 (two) times daily as needed.     aspirin EC 81 MG tablet Take 81 mg by mouth every morning.      atorvastatin (LIPITOR) 10 MG tablet Take 5 mg by mouth daily.     azithromycin (ZITHROMAX) 250 MG tablet Take 2 tabs by mouth 30-60 minutes prior to dental work 2 each 1   cetirizine (ZYRTEC) 10 MG tablet Take 10 mg by mouth as needed.     Cholecalciferol (VITAMIN D PO) Take 1 tablet by mouth every morning. Per patient taking 1,000 mcg     Cyanocobalamin (B-12 PO) Take 1 tablet by mouth every morning.      doxycycline (VIBRA-TABS) 100 MG tablet Take 1 tablet (100 mg total) by mouth 2 (two) times daily. 20 tablet 0   FLOVENT HFA 110 MCG/ACT inhaler Inhale 1 puff into the lungs 2 (two) times daily.     fluticasone (FLONASE) 50 MCG/ACT nasal spray Place 2 sprays into the nose daily as needed for allergies.  (Patient not taking: Reported on 04/29/2022)     HYDROcodone-acetaminophen (NORCO) 5-325 MG tablet Take 1-2 tablets by mouth every 6 (six) hours as needed for  moderate pain or severe pain. (Patient not taking: Reported on 04/29/2022) 20 tablet 0   losartan (COZAAR) 50 MG tablet Take 50 mg by mouth 2 (two) times daily.  Pt takes 1 tablet in morning and 1 tablet at night.     Multiple Vitamin (MULTIVITAMIN WITH MINERALS) TABS Take 1 tablet by mouth every morning.      omeprazole (PRILOSEC) 10 MG capsule Take 1 capsule by mouth daily.     Tamsulosin HCl (FLOMAX) 0.4 MG CAPS Take 0.4 mg by mouth daily after supper.      No current facility-administered medications for this visit.    SURGICAL HISTORY:  Past Surgical History:  Procedure Laterality Date   AORTIC  VALVE REPLACEMENT  11/03/2011   Procedure: AORTIC VALVE REPLACEMENT (AVR);  Surgeon: Loreli Slot, MD;  Location: [facility] OR;  Service: Open Heart Surgery;  Laterality: N/A;  Partial Sternotomy   APPENDECTOMY     CARDIAC CATHETERIZATION     COLONOSCOPY W/ POLYPECTOMY     COLONOSCOPY WITH PROPOFOL N/A 07/11/2014   Procedure: COLONOSCOPY WITH PROPOFOL;  Surgeon: Charolett Bumpers, MD;  Location: [facility];  Service: Endoscopy;  Laterality: N/A;   ESOPHAGOGASTRODUODENOSCOPY (EGD) WITH PROPOFOL N/A 07/11/2014   Procedure: ESOPHAGOGASTRODUODENOSCOPY (EGD) WITH PROPOFOL;  Surgeon: Charolett Bumpers, MD;  Location: [facility];  Service: Endoscopy;  Laterality: N/A;   FEMORAL HERNIA REPAIR     , x5   HERNIA REPAIR Bilateral    KNEE ARTHROSCOPY Bilateral    SHOULDER ARTHROSCOPY Right    STERNOTOMY  11/03/2011   Procedure: STERNOTOMY;  Surgeon: Loreli Slot, MD;  Location: [facility] OR;  Service: Open Heart Surgery;  Laterality: N/A;  Partial   TONSILLECTOMY     UMBILICAL HERNIA REPAIR N/A 10/20/2018   Procedure: OPEN REPAIR UMBILICAL HERNIA WITH  MESH;  Surgeon: Claud Kelp, MD;  Location: [facility];  Service: General;  Laterality: N/A;    REVIEW OF SYSTEMS:   Review of Systems  Constitutional: Negative for appetite change, chills, fatigue, fever and unexpected weight change.  HENT:   Negative for mouth sores, nosebleeds, sore throat and trouble swallowing.   Eyes: Negative for eye problems and icterus.  Respiratory: Negative for cough, hemoptysis, shortness of breath and wheezing.   Cardiovascular: Negative for chest pain and leg swelling.  Gastrointestinal: Negative for abdominal pain, constipation, diarrhea, nausea and vomiting.  Genitourinary: Negative for bladder incontinence, difficulty urinating, dysuria, frequency and hematuria.   Musculoskeletal: Negative for back pain, gait problem, neck pain and neck stiffness.  Skin: Negative for itching and rash.  Neurological:  Negative for dizziness, extremity weakness, gait problem, headaches, light-headedness and seizures.  Hematological: Negative for adenopathy. Does not bruise/bleed easily.  Psychiatric/Behavioral: Negative for confusion, depression and sleep disturbance. The patient is not nervous/anxious.     PHYSICAL EXAMINATION:  There were no vitals taken for this visit.  ECOG PERFORMANCE STATUS: {CHL ONC ECOG [id number]  Physical Exam  Constitutional: Oriented to person, place, and time and well-developed, well-nourished, and in no distress. No distress.  HENT:  Head: Normocephalic and atraumatic.  Mouth/Throat: Oropharynx is clear and moist. No oropharyngeal exudate.  Eyes: Conjunctivae are normal. Right eye exhibits no discharge. Left eye exhibits no discharge. No scleral icterus.  Neck: Normal range of motion. Neck supple.  Cardiovascular: Normal rate, regular rhythm, normal heart sounds and intact distal pulses.   Pulmonary/Chest: Effort normal and breath sounds normal. No respiratory distress. No wheezes. No rales.  Abdominal: Soft. Bowel sounds are normal.  Exhibits no distension and no mass. There is no tenderness.  Musculoskeletal: Normal range of motion. Exhibits no edema.  Lymphadenopathy:    No cervical adenopathy.  Neurological: Alert and oriented to person, place, and time. Exhibits normal muscle tone. Gait normal. Coordination normal.  Skin: Skin is warm and dry. No rash noted. Not diaphoretic. No erythema. No pallor.  Psychiatric: Mood, memory and judgment normal.  Vitals reviewed.  LABORATORY DATA: Lab Results  Component Value Date   WBC 16.4 (H) 09/08/2022   HGB 13.4 09/08/2022   HCT 39.1 09/08/2022   MCV 95.8 09/08/2022   PLT 184 09/08/2022      Chemistry      Component Value Date/Time   NA 140 09/08/2022 1251   NA 143 06/04/2021 1409   K 4.4 09/08/2022 1251   CL 106 09/08/2022 1251   CO2 28 09/08/2022 1251   BUN 21 09/08/2022 1251   BUN 10 06/04/2021 1409    CREATININE 0.94 09/08/2022 1251   CREATININE 1.14 10/15/2015 0811      Component Value Date/Time   CALCIUM 9.1 09/08/2022 1251   ALKPHOS 54 09/08/2022 1251   AST 18 09/08/2022 1251   ALT 19 09/08/2022 1251   BILITOT 1.0 09/08/2022 1251       RADIOGRAPHIC STUDIES:  No results found.   ASSESSMENT/PLAN:  This is a very pleasant 82 year old Caucasian male with stage *** CLL. He was diagnosed in August 2024.   The patient was seen with Dr. Arbutus Ped. Labs were reviewed. His CBC shows WBC of ***, Hbg of ***, and platelet count of ***.   Dr. Arbutus Ped recommends he continue on observation with repeat blood work in 6 months. WE will arrange for ***imaging to rule out organomegaly and lymph adenopathy.   The patient was advised to call immediately if she has any concerning symptoms in the interval. The patient voices understanding of current disease status and treatment options and is in agreement with the current care plan. All questions were answered. The patient knows to call the clinic with any problems, questions or concerns. We can certainly see the patient much sooner if necessary'  No orders of the defined types were placed in this encounter.    I spent {CHL ONC TIME VISIT - UJWJX:[id number]} counseling the patient face to face. The total time spent in the appointment was {CHL ONC TIME VISIT - ZHYQM:[id number]}.  Chaundra Abreu L Deandrew Hoecker, PA-C 10/29/22

## 2022-10-31 ENCOUNTER — Telehealth: Payer: Self-pay | Admitting: Internal Medicine

## 2022-10-31 NOTE — Telephone Encounter (Signed)
Called patient regarding October appointments, patient is notified.

## 2022-10-31 NOTE — Progress Notes (Unsigned)
[facility] OFFICE PROGRESS NOTE  Emilio Aspen, MD 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 Kathrynn Running)   PRIOR THERAPY: None  CURRENT THERAPY: Observation   INTERVAL HISTORY: Tim Hardy 82 y.o. male returns to the clinic today for a follow up visit accompanied by his wife. The patient established care in the clinic on 09/08/22 and he was referred for leukocytosis with lymphocyte predominance without any clear evidence of infection. 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   The patient is asymptomatic and only has mildly elevated WBC without any thrombocytopenia or anemia. He has been struggling with sinus infection on and off since we have seen him last time. He was given doxycycline at his last appointment. He uses allegra, flonase, and mucinex. 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   Observation was recommended. Denies any fevers.  Denies any chills.  He does get night sweats a few time per week.  Denies any lymphadenopathy.  Denies any unexplained weight loss.  Denies early satiety. Denies unexplained weight loss. Denies abnormal bleeding or bruising. Denies signs of infection such as cough, shortness of breath, skin infections, abdominal pain, diarrhea, or dysuria. Denies any nausea or vomiting. He is here for evaluation and repeat blood work.   MEDICAL HISTORY: Past Medical History:  Diagnosis Date   Asthma    Basal cell adenocarcinoma    BPH (benign prostatic hyperplasia)    Bronchitis    Cancer (HCC)    skin cancer removed in August   Colon polyps    Colon, diverticulosis    Diastolic dysfunction    Dilated aortic root (HCC)    ED (erectile dysfunction)    GERD (gastroesophageal reflux disease)    Heart  murmur    Hiatal hernia    History of aortic valve replacement with bioprosthetic valve    Incarcerated umbilical hernia 10/20/2018   Mild CAD    Perennial allergic rhinitis    Shortness of breath    Thoracic ascending aortic aneurysm (HCC)     ALLERGIES:  is allergic to oxycontin [oxycodone hcl] and penicillins.  MEDICATIONS:  Current Outpatient Medications  Medication Sig Dispense Refill   acetaminophen (TYLENOL) 500 MG tablet Take 500 mg by mouth every 6 (six) hours as needed.     ALPRAZolam (XANAX) 0.5 MG tablet Take 0.25-0.5 mg by mouth 2 (two) times daily as needed.     aspirin EC 81 MG tablet Take 81 mg by mouth every morning.      atorvastatin (LIPITOR) 10 MG tablet Take 5 mg by mouth daily.     azithromycin (ZITHROMAX) 250 MG tablet Take 2 tabs by mouth 30-60 minutes prior to dental work 2 each 1   cetirizine (ZYRTEC) 10 MG tablet Take 10 mg by mouth as needed.     Cholecalciferol (VITAMIN D PO) Take 1 tablet by mouth every morning. Per patient taking 1,000 mcg     Cyanocobalamin (B-12 PO) Take 1 tablet by mouth every morning.      doxycycline (VIBRA-TABS) 100 MG tablet Take 1 tablet (100 mg total) by mouth 2 (two) times daily. 20 tablet 0   FLOVENT HFA 110 MCG/ACT inhaler Inhale 1 puff into the lungs 2 (two) times daily.     fluticasone (FLONASE) 50 MCG/ACT  nasal spray Place 2 sprays into the nose daily as needed for allergies.     HYDROcodone-acetaminophen (NORCO) 5-325 MG tablet Take 1-2 tablets by mouth every 6 (six) hours as needed for moderate pain or severe pain. 20 tablet 0   losartan (COZAAR) 50 MG tablet Take 50 mg by mouth 2 (two) times daily.  Pt takes 1 tablet in morning and 1 tablet at night.     Multiple Vitamin (MULTIVITAMIN WITH MINERALS) TABS Take 1 tablet by mouth every morning.      omeprazole (PRILOSEC) 10 MG capsule Take 1 capsule by mouth daily.     Tamsulosin HCl (FLOMAX) 0.4 MG CAPS Take 0.4 mg by mouth daily after supper.      No current  facility-administered medications for this visit.    SURGICAL HISTORY:  Past Surgical History:  Procedure Laterality Date   AORTIC VALVE REPLACEMENT  11/03/2011   Procedure: AORTIC VALVE REPLACEMENT (AVR);  Surgeon: Loreli Slot, MD;  Location: [facility] OR;  Service: Open Heart Surgery;  Laterality: N/A;  Partial Sternotomy   APPENDECTOMY     CARDIAC CATHETERIZATION     COLONOSCOPY W/ POLYPECTOMY     COLONOSCOPY WITH PROPOFOL N/A 07/11/2014   Procedure: COLONOSCOPY WITH PROPOFOL;  Surgeon: Charolett Bumpers, MD;  Location: [facility];  Service: Endoscopy;  Laterality: N/A;   ESOPHAGOGASTRODUODENOSCOPY (EGD) WITH PROPOFOL N/A 07/11/2014   Procedure: ESOPHAGOGASTRODUODENOSCOPY (EGD) WITH PROPOFOL;  Surgeon: Charolett Bumpers, MD;  Location: [facility];  Service: Endoscopy;  Laterality: N/A;   FEMORAL HERNIA REPAIR     , x5   HERNIA REPAIR Bilateral    KNEE ARTHROSCOPY Bilateral    SHOULDER ARTHROSCOPY Right    STERNOTOMY  11/03/2011   Procedure: STERNOTOMY;  Surgeon: Loreli Slot, MD;  Location[facility] OR;  Service: Open Heart Surgery;  Laterality: N/A;  Partial   TONSILLECTOMY     UMBILICAL HERNIA REPAIR N/A 10/20/2018   Procedure: OPEN REPAIR UMBILICAL HERNIA WITH  MESH;  Surgeon: Claud Kelp, MD;  Location:  SUR[facility]ERY C[facility];  Service: General;  Laterality: N/A;    REVIEW OF SYSTEMS:   Review of Systems  Constitutional: Negative for appetite change, chills, fatigue, fever and unexpected weight change.  HENT: Positive for sinus pressure. Negative for mouth sores, nosebleeds, sore throat and trouble swallowing.   Eyes: Negative for eye problems and icterus.  Respiratory: Negative for cough, hemoptysis, shortness of breath and wheezing.   Cardiovascular: Negative for chest pain and leg swelling.  Gastrointestinal: Negative for abdominal pain, constipation, diarrhea, nausea and vomiting.  Genitourinary: Negative for bladder incontinence, difficulty urinating, dysuria,  frequency and hematuria.   Musculoskeletal: Negative for back pain, gait problem, neck pain and neck stiffness.  Skin: Negative for itching and rash.  Neurological: Negative for dizziness, extremity weakness, gait problem, headaches, light-headedness and seizures.  Hematological: Negative for adenopathy. Does not bruise/bleed easily.  Psychiatric/Behavioral: Negative for confusion, depression and sleep disturbance. The patient is not nervous/anxious.     PHYSICAL EXAMINATION:  Blood pressure 123/71, pulse 75, temperature 98.5 F (36.9 C), temperature source Oral, resp. rate 15, weight 229 lb 3.2 oz (104 kg), SpO2 99%.  ECOG PERFORMANCE STATUS: 1  Physical Exam  Constitutional: Oriented to person, place, and time and well-developed, well-nourished, and in no distress.   HENT:  Head: Normocephalic and atraumatic.  Mouth/Throat: Oropharynx is clear and moist. No oropharyngeal exudate.  Eyes: Conjunctivae are normal. Right eye exhibits no discharge. Left eye exhibits no discharge. No scleral icterus.  Neck: Normal range of motion. Neck supple.  Cardiovascular: Normal rate, regular rhythm, normal heart sounds and intact distal pulses.   Pulmonary/Chest: Effort normal and breath sounds normal. No respiratory distress. No wheezes. No rales.  Abdominal: Soft. Bowel sounds are normal. Exhibits no distension and no mass. There is no tenderness.  Musculoskeletal: Normal range of motion. Exhibits no edema.  Lymphadenopathy:    No cervical adenopathy.  Neurological: Alert and oriented to person, place, and time. Exhibits normal muscle tone. Gait normal. Coordination normal.  Skin: Skin is warm and dry. No rash noted. Not diaphoretic. No erythema. No pallor.  Psychiatric: Mood, memory and judgment normal.  Vitals reviewed.  LABORATORY DATA: Lab Results  Component Value Date   WBC 31.4 (H) 11/06/2022   HGB 13.9 11/06/2022   HCT 41.2 11/06/2022   MCV 94.9 11/06/2022   PLT 177 11/06/2022       Chemistry      Component Value Date/Time   NA 139 11/06/2022 0848   NA 143 06/04/2021 1409   K 3.9 11/06/2022 0848   CL 107 11/06/2022 0848   CO2 25 11/06/2022 0848   BUN 22 11/06/2022 0848   BUN 10 06/04/2021 1409   CREATININE 0.96 11/06/2022 0848   CREATININE 1.14 10/15/2015 0811      Component Value Date/Time   CALCIUM 9.3 11/06/2022 0848   ALKPHOS 51 11/06/2022 0848   AST 16 11/06/2022 0848   ALT 19 11/06/2022 0848   BILITOT 1.4 (H) 11/06/2022 0848       RADIOGRAPHIC STUDIES:  No results found.   ASSESSMENT/PLAN:  This is a very pleasant 82 year old Caucasian male with CLL. He was diagnosed in August 2024.   The patient was seen with Dr. Arbutus Ped. Labs were reviewed. His CBC shows WBC of 31.4 which almost has doubled since he was here 2 months ago (16.4). his differential shows elevated lymphocytes at 23.6. His Hbg and platelet count are normal.   Given the increase in his WBC and lymphocytes in 2 months, Dr. Arbutus Ped recommends CT CAP to assess for organomegaly or lymphadenopathy. He also recommends bone marrow biopsy and aspirate.   We will see him back in 1 month with repeat labs and to review these results.    The patient was advised to call immediately if she has any concerning symptoms in the interval. The patient voices understanding of current disease status and treatment options and is in agreement with the current care plan. All questions were answered. The patient knows to call the clinic with any problems, questions or concerns. We can certainly see the patient much sooner if necessary'  Orders Placed This Encounter  Procedures   CT CHEST ABDOMEN PELVIS W CONTRAST    Standing Status:   Future    Standing Expiration Date:   11/06/2023    Order Specific Question:   If indicated for the ordered procedure, I authorize the administration of contrast media per Radiology protocol    Answer:   Yes    Order Specific Question:   Does the patient have a contrast  media/X-ray dye allergy?    Answer:   No    Order Specific Question:   Preferred imaging location?    Answer:   [facility]    Order Specific Question:   If indicated for the ordered procedure, I authorize the administration of oral contrast media per Radiology protocol    Answer:   Yes   CT Biopsy    Standing Status:  Future    Standing Expiration Date:   05/07/2023    Order Specific Question:   Lab orders requested (DO NOT place separate lab orders, these will be automatically ordered during procedure specimen collection):    Answer:   Surgical Pathology    Order Specific Question:   Reason for Exam (SYMPTOM  OR DIAGNOSIS REQUIRED)    Answer:   CLL but lymphocytes increasing quicker than anticipated.    Order Specific Question:   Preferred location?    Answer:   [facility]   CT BONE MARROW BIOPSY & ASPIRATION    Standing Status:   Future    Standing Expiration Date:   05/07/2023    Order Specific Question:   Reason for Exam (SYMPTOM  OR DIAGNOSIS REQUIRED)    Answer:   presumed CLL but lymphocytes elevating quicker than anticipated    Order Specific Question:   Preferred imaging location?    Answer:   [facility]    Order Specific Question:   Radiology Contrast Protocol - do NOT remove file path    Answer:   [email or web address]   CBC with Differential (Cancer Center Only)    Standing Status:   Future    Standing Expiration Date:   11/06/2023      Tim Abraham Lavern Crimi, PA-C 11/06/22  ADDENDUM: Hematology/Oncology Attending: I had a face-to-face encounter with the patient today.  I reviewed his record, lab and recommended his care plan.  This is a very pleasant 82 years old white male with recently diagnosed chronic lymphocytic leukemia in August 2024.  The patient also has a history of prostate adenocarcinoma diagnosed in 2023 followed by Dr. Kathrynn Running.  He is currently on observation and feeling fine.  He was found  on previous flow cytometry to have clonal B-cell population consisting 78% of the lymphocyte consistent with chronic lymphocytic leukemia.  The patient presented today for evaluation with repeat blood work.  His CBC today showed significantly increased total white blood count up to 31.4 compared to 16.4 two months ago.  The patient has been dealing with sinusitis recently.  This could explain his elevated white blood count but there is a concern about the short doubling time of his total white blood count especially the absolute lymphocyte count. I recommended for the patient to proceed with a bone marrow biopsy and aspirate in addition to CT scan of the chest, abdomen and pelvis to rule out any significant lymphadenopathy. The patient is in agreement with the current plan. He will come back for follow-up visit in 1 months for evaluation and discussion of his biopsy and his scan results. He was advised to call immediately if he has any other concerning symptoms in the interval. The total time spent in the appointment was 25 minutes. Disclaimer: This note was dictated with voice recognition software. Similar sounding words can inadvertently be transcribed and may be missed upon review. Lajuana Matte, MD

## 2022-11-06 ENCOUNTER — Ambulatory Visit: Payer: Medicare Other | Admitting: Physician Assistant

## 2022-11-06 ENCOUNTER — Inpatient Hospital Stay: Payer: Medicare Other | Admitting: Physician Assistant

## 2022-11-06 ENCOUNTER — Inpatient Hospital Stay: Payer: Medicare Other | Attending: Physician Assistant

## 2022-11-06 ENCOUNTER — Other Ambulatory Visit: Payer: Medicare Other

## 2022-11-06 VITALS — BP 123/71 | HR 75 | Temp 98.5°F | Resp 15 | Wt 229.2 lb

## 2022-11-06 DIAGNOSIS — C9110 Chronic lymphocytic leukemia of B-cell type not having achieved remission: Secondary | ICD-10-CM | POA: Diagnosis not present

## 2022-11-06 DIAGNOSIS — C61 Malignant neoplasm of prostate: Secondary | ICD-10-CM | POA: Insufficient documentation

## 2022-11-06 DIAGNOSIS — D72820 Lymphocytosis (symptomatic): Secondary | ICD-10-CM

## 2022-11-06 LAB — CMP (CANCER CENTER ONLY)
ALT: 19 U/L (ref 0–44)
AST: 16 U/L (ref 15–41)
Albumin: 4 g/dL (ref 3.5–5.0)
Alkaline Phosphatase: 51 U/L (ref 38–126)
Anion gap: 7 (ref 5–15)
BUN: 22 mg/dL (ref 8–23)
CO2: 25 mmol/L (ref 22–32)
Calcium: 9.3 mg/dL (ref 8.9–10.3)
Chloride: 107 mmol/L (ref 98–111)
Creatinine: 0.96 mg/dL (ref 0.61–1.24)
GFR, Estimated: >60 mL/min (ref >60)
Glucose, Bld: 113 mg/dL — ABNORMAL HIGH (ref 70–99)
Potassium: 3.9 mmol/L (ref 3.5–5.1)
Sodium: 139 mmol/L (ref 135–145)
Total Bilirubin: 1.4 mg/dL — ABNORMAL HIGH (ref 0.3–1.2)
Total Protein: 6 g/dL — ABNORMAL LOW (ref 6.5–8.1)

## 2022-11-06 LAB — CBC WITH DIFFERENTIAL (CANCER CENTER ONLY)
Abs Immature Granulocytes: 0.1 10*3/uL — ABNORMAL HIGH (ref 0.00–0.07)
Basophils Absolute: 0.1 10*3/uL (ref 0.0–0.1)
Basophils Relative: 0 %
Eosinophils Absolute: 0.3 10*3/uL (ref 0.0–0.5)
Eosinophils Relative: 1 %
HCT: 41.2 % (ref 39.0–52.0)
Hemoglobin: 13.9 g/dL (ref 13.0–17.0)
Immature Granulocytes: 0 %
Lymphocytes Relative: 76 %
Lymphs Abs: 23.6 10*3/uL — ABNORMAL HIGH (ref 0.7–4.0)
MCH: 32 pg (ref 26.0–34.0)
MCHC: 33.7 g/dL (ref 30.0–36.0)
MCV: 94.9 fL (ref 80.0–100.0)
Monocytes Absolute: 1.1 10*3/uL — ABNORMAL HIGH (ref 0.1–1.0)
Monocytes Relative: 3 %
Neutro Abs: 6.2 10*3/uL (ref 1.7–7.7)
Neutrophils Relative %: 20 %
Platelet Count: 177 10*3/uL (ref 150–400)
RBC: 4.34 MIL/uL (ref 4.22–5.81)
RDW: 12.8 % (ref 11.5–15.5)
Smear Review: NORMAL
WBC Count: 31.4 10*3/uL — ABNORMAL HIGH (ref 4.0–10.5)
nRBC: 0 % (ref 0.0–0.2)

## 2022-11-13 ENCOUNTER — Ambulatory Visit (HOSPITAL_COMMUNITY)
Admission: RE | Admit: 2022-11-13 | Discharge: 2022-11-13 | Disposition: A | Discharge status: Home / Self Care | Payer: Medicare Other | Source: Ambulatory Visit | Attending: Physician Assistant | Admitting: Physician Assistant

## 2022-11-13 DIAGNOSIS — K5730 Diverticulosis of large intestine without perforation or abscess without bleeding: Secondary | ICD-10-CM | POA: Diagnosis not present

## 2022-11-13 DIAGNOSIS — C9110 Chronic lymphocytic leukemia of B-cell type not having achieved remission: Secondary | ICD-10-CM | POA: Insufficient documentation

## 2022-11-13 DIAGNOSIS — R590 Localized enlarged lymph nodes: Secondary | ICD-10-CM | POA: Diagnosis not present

## 2022-11-13 MED ORDER — IOHEXOL 300 MG/ML  SOLN
100.0000 mL | Freq: Once | INTRAMUSCULAR | Status: AC | PRN
  Administered 2022-11-13: 100 mL via INTRAVENOUS

## 2022-11-13 MED ORDER — SODIUM CHLORIDE (PF) 0.9 % IJ SOLN
INTRAMUSCULAR | Status: AC
  Filled 2022-11-13: qty 50

## 2022-11-13 MED ORDER — IOHEXOL 9 MG/ML PO SOLN
ORAL | Status: AC
  Filled 2022-11-13: qty 1000

## 2022-11-13 MED ORDER — IOHEXOL 9 MG/ML PO SOLN
1000.0000 mL | Freq: Once | ORAL | Status: AC
  Administered 2022-11-13: 1000 mL via ORAL

## 2022-11-18 ENCOUNTER — Telehealth: Payer: Self-pay | Admitting: Physician Assistant

## 2022-11-18 NOTE — Telephone Encounter (Signed)
I called the patient's wife to review the PET scan. The scan mentioned some enlarged pelvic lymph nodes which would be due to his CLL vs prostate cancer. He is following with Dr. Earlene Plater. Dr. Arbutus Ped recommends reaching out to Dr. Earlene Plater to see if he would recommend PSMA PET scan. I let the patient's wife know this, in case they get a call from Dr. Earlene Plater office. We will review the rest of the PET scan and bone marrow biopsy results at his appointment on 11/4. I confirmed the appointment location for his upcoming bone marrow biopsy on 12/01/22.

## 2022-11-19 DIAGNOSIS — L570 Actinic keratosis: Secondary | ICD-10-CM | POA: Diagnosis not present

## 2022-11-19 DIAGNOSIS — L820 Inflamed seborrheic keratosis: Secondary | ICD-10-CM | POA: Diagnosis not present

## 2022-11-19 DIAGNOSIS — L821 Other seborrheic keratosis: Secondary | ICD-10-CM | POA: Diagnosis not present

## 2022-11-19 DIAGNOSIS — L578 Other skin changes due to chronic exposure to nonionizing radiation: Secondary | ICD-10-CM | POA: Diagnosis not present

## 2022-11-19 DIAGNOSIS — L538 Other specified erythematous conditions: Secondary | ICD-10-CM | POA: Diagnosis not present

## 2022-11-27 ENCOUNTER — Telehealth: Payer: Self-pay

## 2022-11-27 NOTE — Telephone Encounter (Signed)
Pt's wife called in regards to recent CT scan.  Reminded her that she talked with Cassie, PA on 10/15 about the results and clarified some concerns.  Informed her that if Dr. Jinny Sanders office hasn't reached out by next week, that it is okay to call their office if shed like.  Confirmed biopsy and next appts with pts wife.  She verbalized understanding and was told to call with any concerns or questions.

## 2022-12-02 ENCOUNTER — Telehealth: Payer: Self-pay | Admitting: Medical Oncology

## 2022-12-02 NOTE — Progress Notes (Deleted)
[facility] OFFICE PROGRESS NOTE  Emilio Aspen, MD 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 Marland Kitchen   PRIOR THERAPY: None   CURRENT THERAPY: ***  INTERVAL HISTORY: Tim Hardy 82 y.o. male returns to the clinic today for a follow up visit accompanied by his wife. The patient established care in the clinic on 09/08/22 and he was referred for leukocytosis with lymphocyte predominance without any clear evidence of infection. 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   He then was seen for follow up on 10/3. His WBC was increasing. Therefore, he had CT scan. He has a history of prostate cancer and follows with Dr. Windy Fast ***at ***. Due to his scan mentioning ***, shared these results with his urologist who arranged for PSMA scan. These results showed ***.   He underwent bone marrow biopsy and aspirate on ***. He tolerated this ***. He is here to review the results.   Otherwise, he denies any major changes in his health since last been seen.   Observation was recommended. Denies any fevers.  Denies any chills.  He does get night sweats a few time per week.  Denies any lymphadenopathy.  Denies any unexplained weight loss.  Denies early satiety. Denies unexplained weight loss. Denies abnormal bleeding or bruising. Denies signs of infection such as cough, shortness of breath, skin infections, abdominal pain, diarrhea, or dysuria. He does typically struggle with sinus infections.  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 Denies any nausea or vomiting. He is here for evaluation and repeat blood work.   MEDICAL HISTORY: Past Medical History:  Diagnosis Date   Asthma    Basal cell adenocarcinoma    BPH (benign prostatic hyperplasia)     Bronchitis    Cancer (HCC)    skin cancer removed in August   Colon polyps    Colon, diverticulosis    Diastolic dysfunction    Dilated aortic root (HCC)    ED (erectile dysfunction)    GERD (gastroesophageal reflux disease)    Heart murmur    Hiatal hernia    History of aortic valve replacement with bioprosthetic valve    Incarcerated umbilical hernia 10/20/2018   Mild CAD    Perennial allergic rhinitis    Shortness of breath    Thoracic ascending aortic aneurysm (HCC)     ALLERGIES:  is allergic to oxycontin [oxycodone hcl] and penicillins.  MEDICATIONS:  Current Outpatient Medications  Medication Sig Dispense Refill   acetaminophen (TYLENOL) 500 MG tablet Take 500 mg by mouth every 6 (six) hours as needed.     ALPRAZolam (XANAX) 0.5 MG tablet Take 0.25-0.5 mg by mouth 2 (two) times daily as needed.     aspirin EC 81 MG tablet Take 81 mg by mouth every morning.      atorvastatin (LIPITOR) 10 MG tablet Take 5 mg by mouth daily.     azithromycin (ZITHROMAX) 250 MG tablet Take 2 tabs by mouth 30-60 minutes prior to dental work 2 each 1   cetirizine (ZYRTEC) 10 MG tablet Take 10 mg by mouth as needed.     Cholecalciferol (VITAMIN D PO) Take 1 tablet by mouth every morning. Per patient taking 1,000 mcg     Cyanocobalamin (B-12 PO) Take 1 tablet by  mouth every morning.      doxycycline (VIBRA-TABS) 100 MG tablet Take 1 tablet (100 mg total) by mouth 2 (two) times daily. 20 tablet 0   FLOVENT HFA 110 MCG/ACT inhaler Inhale 1 puff into the lungs 2 (two) times daily.     fluticasone (FLONASE) 50 MCG/ACT nasal spray Place 2 sprays into the nose daily as needed for allergies.     HYDROcodone-acetaminophen (NORCO) 5-325 MG tablet Take 1-2 tablets by mouth every 6 (six) hours as needed for moderate pain or severe pain. 20 tablet 0   losartan (COZAAR) 50 MG tablet Take 50 mg by mouth 2 (two) times daily.  Pt takes 1 tablet in morning and 1 tablet at night.     Multiple Vitamin (MULTIVITAMIN  WITH MINERALS) TABS Take 1 tablet by mouth every morning.      omeprazole (PRILOSEC) 10 MG capsule Take 1 capsule by mouth daily.     Tamsulosin HCl (FLOMAX) 0.4 MG CAPS Take 0.4 mg by mouth daily after supper.      No current facility-administered medications for this visit.    SURGICAL HISTORY:  Past Surgical History:  Procedure Laterality Date   AORTIC VALVE REPLACEMENT  11/03/2011   Procedure: AORTIC VALVE REPLACEMENT (AVR);  Surgeon: Loreli Slot, MD;  Location: [facility] OR;  Service: Open Heart Surgery;  Laterality: N/A;  Partial Sternotomy   APPENDECTOMY     CARDIAC CATHETERIZATION     COLONOSCOPY W/ POLYPECTOMY     COLONOSCOPY WITH PROPOFOL N/A 07/11/2014   Procedure: COLONOSCOPY WITH PROPOFOL;  Surgeon: Charolett Bumpers, MD;  Location: [facility];  Service: Endoscopy;  Laterality: N/A;   ESOPHAGOGASTRODUODENOSCOPY (EGD) WITH PROPOFOL N/A 07/11/2014   Procedure: ESOPHAGOGASTRODUODENOSCOPY (EGD) WITH PROPOFOL;  Surgeon: Charolett Bumpers, MD;  Location: [facility];  Service: Endoscopy;  Laterality: N/A;   FEMORAL HERNIA REPAIR     , x5   HERNIA REPAIR Bilateral    KNEE ARTHROSCOPY Bilateral    SHOULDER ARTHROSCOPY Right    STERNOTOMY  11/03/2011   Procedure: STERNOTOMY;  Surgeon: Loreli Slot, MD;  Location: [facility] OR;  Service: Open Heart Surgery;  Laterality: N/A;  Partial   TONSILLECTOMY     UMBILICAL HERNIA REPAIR N/A 10/20/2018   Procedure: OPEN REPAIR UMBILICAL HERNIA WITH  MESH;  Surgeon: Claud Kelp, MD;  Location: [facility];  Service: General;  Laterality: N/A;    REVIEW OF SYSTEMS:   Review of Systems  Constitutional: Negative for appetite change, chills, fatigue, fever and unexpected weight change.  HENT:   Negative for mouth sores, nosebleeds, sore throat and trouble swallowing.   Eyes: Negative for eye problems and icterus.  Respiratory: Negative for cough, hemoptysis, shortness of breath and wheezing.   Cardiovascular: Negative for chest  pain and leg swelling.  Gastrointestinal: Negative for abdominal pain, constipation, diarrhea, nausea and vomiting.  Genitourinary: Negative for bladder incontinence, difficulty urinating, dysuria, frequency and hematuria.   Musculoskeletal: Negative for back pain, gait problem, neck pain and neck stiffness.  Skin: Negative for itching and rash.  Neurological: Negative for dizziness, extremity weakness, gait problem, headaches, light-headedness and seizures.  Hematological: Negative for adenopathy. Does not bruise/bleed easily.  Psychiatric/Behavioral: Negative for confusion, depression and sleep disturbance. The patient is not nervous/anxious.     PHYSICAL EXAMINATION:  There were no vitals taken for this visit.  ECOG PERFORMANCE STATUS: {CHL ONC ECOG [id number]  Physical Exam  Constitutional: Oriented to person, place, and time and well-developed, well-nourished, and in no distress.  No distress.  HENT:  Head: Normocephalic and atraumatic.  Mouth/Throat: Oropharynx is clear and moist. No oropharyngeal exudate.  Eyes: Conjunctivae are normal. Right eye exhibits no discharge. Left eye exhibits no discharge. No scleral icterus.  Neck: Normal range of motion. Neck supple.  Cardiovascular: Normal rate, regular rhythm, normal heart sounds and intact distal pulses.   Pulmonary/Chest: Effort normal and breath sounds normal. No respiratory distress. No wheezes. No rales.  Abdominal: Soft. Bowel sounds are normal. Exhibits no distension and no mass. There is no tenderness.  Musculoskeletal: Normal range of motion. Exhibits no edema.  Lymphadenopathy:    No cervical adenopathy.  Neurological: Alert and oriented to person, place, and time. Exhibits normal muscle tone. Gait normal. Coordination normal.  Skin: Skin is warm and dry. No rash noted. Not diaphoretic. No erythema. No pallor.  Psychiatric: Mood, memory and judgment normal.  Vitals reviewed.  LABORATORY DATA: Lab Results   Component Value Date   WBC 31.4 (H) 11/06/2022   HGB 13.9 11/06/2022   HCT 41.2 11/06/2022   MCV 94.9 11/06/2022   PLT 177 11/06/2022      Chemistry      Component Value Date/Time   NA 139 11/06/2022 0848   NA 143 06/04/2021 1409   K 3.9 11/06/2022 0848   CL 107 11/06/2022 0848   CO2 25 11/06/2022 0848   BUN 22 11/06/2022 0848   BUN 10 06/04/2021 1409   CREATININE 0.96 11/06/2022 0848   CREATININE 1.14 10/15/2015 0811      Component Value Date/Time   CALCIUM 9.3 11/06/2022 0848   ALKPHOS 51 11/06/2022 0848   AST 16 11/06/2022 0848   ALT 19 11/06/2022 0848   BILITOT 1.4 (H) 11/06/2022 0848       RADIOGRAPHIC STUDIES:  CT CHEST ABDOMEN PELVIS W CONTRAST  Result Date: 11/17/2022 CLINICAL DATA:  History of CLL assess for organomegaly and lymphadenopathy. * Tracking Code: BO * EXAM: CT CHEST, ABDOMEN, AND PELVIS WITH CONTRAST TECHNIQUE: Multidetector CT imaging of the chest, abdomen and pelvis was performed following the standard protocol during bolus administration of intravenous contrast. RADIATION DOSE REDUCTION: This exam was performed according to the departmental dose-optimization program which includes automated exposure control, adjustment of the mA and/or kV according to patient size and/or use of iterative reconstruction technique. CONTRAST:  OMNIPAQUE IOHEXOL 300 MG/ML  SOLN COMPARISON:  Multiple priors including PET/CT December 22, 2021. FINDINGS: CT CHEST FINDINGS Cardiovascular: Aortic atherosclerosis. No central pulmonary embolus on this nondedicated study. Aortic valve prosthesis. Calcifications in the mitral annulus. Coronary artery calcifications. Median sternotomy wires. Mediastinum/Nodes: No suspicious thyroid nodule. No pathologically enlarged mediastinal, hilar or axillary lymph nodes. The esophagus is grossly unremarkable. Lungs/Pleura: No suspicious pulmonary nodules or masses. Bibasilar scarring/atelectasis. Musculoskeletal: Prior median sternotomy. No  aggressive lytic or blastic lesion of bone. Unchanged wedging deformity of the T12 vertebral body. CT ABDOMEN PELVIS FINDINGS Hepatobiliary: No suspicious hepatic lesion. Gallbladder is unremarkable. No biliary ductal dilation. Pancreas: No pancreatic ductal dilation or evidence of acute inflammation. Spleen: No splenomegaly. Adrenals/Urinary Tract: Bilateral adrenal glands appear normal. No hydronephrosis. Kidneys demonstrate symmetric enhancement. Urinary bladder is unremarkable for degree of distension. Stomach/Bowel: Radiopaque enteric contrast material traverses distal loops of small bowel. Stomach is unremarkable for degree of distension. No pathologic dilation of small or large bowel. Colonic diverticulosis without findings of acute diverticulitis. Vascular/Lymphatic: Aortic atherosclerosis. Smooth IVC contours. The portal, splenic and superior mesenteric veins are patent. Enlarged iliac side chain and pelvic sidewall lymph nodes. For reference: -left  internal iliac lymph node measures 12 mm in short axis on image 114/2 -right internal iliac lymph node measures 13 mm in short axis on image 112/2 -left obturator lymph node measures 17 mm in short axis on image 117/2. Reproductive: Brachytherapy seeds in the prostate gland. Other: No aggressive lytic or blastic lesion of bone. Musculoskeletal: No aggressive lytic or blastic lesion of bone. 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 Multilevel degenerative changes spine. IMPRESSION: 1. Enlarged iliac side chain and pelvic sidewall lymph nodes, may reflect CLL or prostate nodal metastasis consider more definitive assessment by PSMA PET-CT. 2. No hepatosplenomegaly. 3. No pathologically enlarged thoracic or abdominal lymph nodes. 4.  Aortic Atherosclerosis (ICD10-I70.0). Electronically Signed   By: Maudry Mayhew M.D.   On: 11/17/2022 15:45     ASSESSMENT/PLAN:  This is a very pleasant 82 year old Caucasian male with CLL. He was  diagnosed in August 2024.   His WBC had been elevating faster than anticipated. Therefore, he had a bone marrow biopsy and aspirate performed to evaluate his condition.   The patient was seen with Dr. Arbutus Ped. Labs were reviewed. Recommend ***.   I will arrange for ***  He is expected to have PSMA scan performed by ***    f/u?  The patient was advised to call immediately if she has any concerning symptoms in the interval. The patient voices understanding of current disease status and treatment options and is in agreement with the current care plan. All questions were answered. The patient knows to call the clinic with any problems, questions or concerns. We can certainly see the patient much sooner if necessary          No orders of the defined types were placed in this encounter.    I spent {CHL ONC TIME VISIT - [id number]} counseling the patient face to face. The total time spent in the appointment was {CHL ONC TIME VISIT - QIHKV:[id number]}.  Zamar Odwyer L Fergus Throne, PA-C 12/02/22

## 2022-12-02 NOTE — Telephone Encounter (Signed)
LVM at Dr Earlene Plater' number to please order PMSA PET scan.

## 2022-12-03 ENCOUNTER — Other Ambulatory Visit: Payer: Self-pay

## 2022-12-03 DIAGNOSIS — C9110 Chronic lymphocytic leukemia of B-cell type not having achieved remission: Secondary | ICD-10-CM

## 2022-12-04 ENCOUNTER — Encounter (HOSPITAL_COMMUNITY): Payer: Self-pay

## 2022-12-04 ENCOUNTER — Ambulatory Visit (HOSPITAL_COMMUNITY)
Admission: RE | Admit: 2022-12-04 | Discharge: 2022-12-04 | Disposition: A | Discharge status: Home / Self Care | Payer: Medicare Other | Source: Ambulatory Visit | Attending: Physician Assistant | Admitting: Physician Assistant

## 2022-12-04 DIAGNOSIS — Z8546 Personal history of malignant neoplasm of prostate: Secondary | ICD-10-CM | POA: Diagnosis not present

## 2022-12-04 DIAGNOSIS — D759 Disease of blood and blood-forming organs, unspecified: Secondary | ICD-10-CM | POA: Diagnosis not present

## 2022-12-04 DIAGNOSIS — N400 Enlarged prostate without lower urinary tract symptoms: Secondary | ICD-10-CM | POA: Diagnosis not present

## 2022-12-04 DIAGNOSIS — C9110 Chronic lymphocytic leukemia of B-cell type not having achieved remission: Secondary | ICD-10-CM | POA: Diagnosis not present

## 2022-12-04 DIAGNOSIS — I2510 Atherosclerotic heart disease of native coronary artery without angina pectoris: Secondary | ICD-10-CM | POA: Insufficient documentation

## 2022-12-04 LAB — CBC WITH DIFFERENTIAL/PLATELET
Abs Immature Granulocytes: 0.1 10*3/uL — ABNORMAL HIGH (ref 0.00–0.07)
Basophils Absolute: 0.1 10*3/uL (ref 0.0–0.1)
Basophils Relative: 0 %
Eosinophils Absolute: 0.2 10*3/uL (ref 0.0–0.5)
Eosinophils Relative: 1 %
HCT: 41.3 % (ref 39.0–52.0)
Hemoglobin: 13 g/dL (ref 13.0–17.0)
Immature Granulocytes: 0 %
Lymphocytes Relative: 72 %
Lymphs Abs: 17.5 10*3/uL — ABNORMAL HIGH (ref 0.7–4.0)
MCH: 31.6 pg (ref 26.0–34.0)
MCHC: 31.5 g/dL (ref 30.0–36.0)
MCV: 100.5 fL — ABNORMAL HIGH (ref 80.0–100.0)
Monocytes Absolute: 1 10*3/uL (ref 0.1–1.0)
Monocytes Relative: 4 %
Neutro Abs: 5.7 10*3/uL (ref 1.7–7.7)
Neutrophils Relative %: 23 %
Platelets: 180 10*3/uL (ref 150–400)
RBC: 4.11 MIL/uL — ABNORMAL LOW (ref 4.22–5.81)
RDW: 13.3 % (ref 11.5–15.5)
WBC: 24.7 10*3/uL — ABNORMAL HIGH (ref 4.0–10.5)
nRBC: 0 % (ref 0.0–0.2)

## 2022-12-04 MED ORDER — MIDAZOLAM HCL 2 MG/2ML IJ SOLN
INTRAMUSCULAR | Status: AC | PRN
Start: 2022-12-04 — End: 2022-12-04
  Administered 2022-12-04 (×3): 1 mg via INTRAVENOUS

## 2022-12-04 MED ORDER — SODIUM CHLORIDE 0.9 % IV SOLN: INTRAVENOUS | Status: DC

## 2022-12-04 MED ORDER — FENTANYL CITRATE (PF) 100 MCG/2ML IJ SOLN
INTRAMUSCULAR | Status: AC | PRN
Start: 2022-12-04 — End: 2022-12-04
  Administered 2022-12-04 (×2): 50 ug via INTRAVENOUS

## 2022-12-04 MED ORDER — FENTANYL CITRATE (PF) 100 MCG/2ML IJ SOLN
INTRAMUSCULAR | Status: AC
  Filled 2022-12-04: qty 2

## 2022-12-04 MED ORDER — MIDAZOLAM HCL 2 MG/2ML IJ SOLN
INTRAMUSCULAR | Status: AC
  Filled 2022-12-04: qty 4

## 2022-12-04 NOTE — H&P (Signed)
Chief Complaint: Patient was seen in consultation today for bone marrow biopsy with aspiration.   Referring Physician(s): Heilingoetter,Cassandra L  Supervising Physician: Simonne Come  Patient Status: [facility] - Out-pt  History of Present Illness: Tim Hardy is an 82 y.o. male with a medical history significant for aortic valve disorder, CAD, BPH and prostate cancer diagnosed in 2023. He was recently referred to Hematology/Oncology when labs obtained by his PCP showed persistent leukocytosis with lymphocyte predominance. His hematology team ordered additional labs which showed findings consistent with chronic lymphocytic leukemia.   Interventional Radiology has been asked to evaluate this patient for an image-guided bone marrow biopsy with aspiration for further work up.   Past Medical History:  Diagnosis Date   Asthma    Basal cell adenocarcinoma    BPH (benign prostatic hyperplasia)    Bronchitis    Cancer (HCC)    skin cancer removed in August   Colon polyps    Colon, diverticulosis    Diastolic dysfunction    Dilated aortic root (HCC)    ED (erectile dysfunction)    GERD (gastroesophageal reflux disease)    Heart murmur    Hiatal hernia    History of aortic valve replacement with bioprosthetic valve    Incarcerated umbilical hernia 10/20/2018   Mild CAD    Perennial allergic rhinitis    Shortness of breath    Thoracic ascending aortic aneurysm [facility])     Past Surgical History:  Procedure Laterality Date   AORTIC VALVE REPLACEMENT  11/03/2011   Procedure: AORTIC VALVE REPLACEMENT (AVR);  Surgeon: Loreli Slot, MD;  Location: [facility] OR;  Service: Open Heart Surgery;  Laterality: N/A;  Partial Sternotomy   APPENDECTOMY     CARDIAC CATHETERIZATION     COLONOSCOPY W/ POLYPECTOMY     COLONOSCOPY WITH PROPOFOL N/A 07/11/2014   Procedure: COLONOSCOPY WITH PROPOFOL;  Surgeon: Charolett Bumpers, MD;  Location: [facility];  Service: Endoscopy;  Laterality: N/A;    ESOPHAGOGASTRODUODENOSCOPY (EGD) WITH PROPOFOL N/A 07/11/2014   Procedure: ESOPHAGOGASTRODUODENOSCOPY (EGD) WITH PROPOFOL;  Surgeon: Charolett Bumpers, MD;  Location: [facility];  Service: Endoscopy;  Laterality: N/A;   FEMORAL HERNIA REPAIR     , x5   HERNIA REPAIR Bilateral    KNEE ARTHROSCOPY Bilateral    SHOULDER ARTHROSCOPY Right    STERNOTOMY  11/03/2011   Procedure: STERNOTOMY;  Surgeon: Loreli Slot, MD;  Location: [facility] OR;  Service: Open Heart Surgery;  Laterality: N/A;  Partial   TONSILLECTOMY     UMBILICAL HERNIA REPAIR N/A 10/20/2018   Procedure: OPEN REPAIR UMBILICAL HERNIA WITH  MESH;  Surgeon: Claud Kelp, MD;  Location:  SURGERY CENTER;  Service: General;  Laterality: N/A;    Allergies: Oxycontin [oxycodone hcl] and Penicillins  Medications: Prior to Admission medications   Medication Sig Start Date End Date Taking? Authorizing Provider  acetaminophen (TYLENOL) 500 MG tablet Take 500 mg by mouth every 6 (six) hours as needed.    [provider]  ALPRAZolam Prudy Feeler) 0.5 MG tablet Take 0.25-0.5 mg by mouth 2 (two) times daily as needed. 10/04/19   [provider]  aspirin EC 81 MG tablet Take 81 mg by mouth every morning.     [provider]  atorvastatin (LIPITOR) 10 MG tablet Take 5 mg by mouth daily.    [provider]  azithromycin (ZITHROMAX) 250 MG tablet Take 2 tabs by mouth 30-60 minutes prior to dental work 06/04/21   Laurann Montana, PA-C  cetirizine (ZYRTEC) 10 MG tablet Take 10 mg by mouth as needed.    [provider]  Cholecalciferol (VITAMIN D PO) Take 1 tablet by mouth every morning. Per patient taking 1,000 mcg    [provider]  Cyanocobalamin (B-12 PO) Take 1 tablet by mouth every morning.     [provider]  doxycycline (VIBRA-TABS) 100 MG tablet Take 1 tablet (100 mg total) by mouth 2 (two) times daily. 09/08/22   Heilingoetter, Cassandra L, PA-C  FLOVENT HFA 110 MCG/ACT inhaler  Inhale 1 puff into the lungs 2 (two) times daily. 09/10/21   [provider]  fluticasone (FLONASE) 50 MCG/ACT nasal spray Place 2 sprays into the nose daily as needed for allergies.    [provider]  HYDROcodone-acetaminophen (NORCO) 5-325 MG tablet Take 1-2 tablets by mouth every 6 (six) hours as needed for moderate pain or severe pain. 10/20/18   Claud Kelp, MD  losartan (COZAAR) 50 MG tablet Take 50 mg by mouth 2 (two) times daily.  Pt takes 1 tablet in morning and 1 tablet at night.    [provider]  Multiple Vitamin (MULTIVITAMIN WITH MINERALS) TABS Take 1 tablet by mouth every morning.     [provider]  omeprazole (PRILOSEC) 10 MG capsule Take 1 capsule by mouth daily.    [provider]  Tamsulosin HCl (FLOMAX) 0.4 MG CAPS Take 0.4 mg by mouth daily after supper.  09/15/11   [provider]     Family History  Problem Relation Age of Onset   Cancer Father        lung and colon   Hypertension Father    Heart attack Mother    Diabetes Sister    Diabetes Son     Social History   Socioeconomic History   Marital status: Widowed    Spouse name: Not on file   Number of children: Not on file   Years of education: Not on file   Highest education level: Not on file  Occupational History   Occupation: retired  Tobacco Use   Smoking status: Former    Current packs/day: 0.00    Types: Cigarettes    Start date: 02/04/1971    Quit date: 02/03/1972    Years since quitting: 50.8   Smokeless tobacco: Current    Types: Chew  Vaping Use   Vaping status: Never Used  Substance and Sexual Activity   Alcohol use: Yes    Comment: rare   Drug use: No   Sexual activity: Not on file  Other Topics Concern   Not on file  Social History Narrative   Retired Art gallery manager at [employer]    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  All other systems are negative.  Review of Systems  Constitutional:  Negative for appetite change and fatigue.  Respiratory:  Negative for cough and shortness of breath.   Cardiovascular:  Negative for chest pain and leg swelling.  Gastrointestinal:  Negative for abdominal pain, diarrhea, nausea and vomiting.  Genitourinary:  Negative for flank pain.  Musculoskeletal:  Negative for  back pain.  Neurological:  Negative for dizziness and headaches.    Vital Signs: BP 130/73   Pulse 81   Temp 97.9 F (36.6 C) (Oral)   Resp 16   Ht 6\' 1"  (1.854 m)   Wt 225 lb (102.1 kg)   SpO2 96%   BMI 29.69 kg/m   Physical Exam Constitutional:      General: He is not in acute distress. HENT:     Mouth/Throat:     Mouth: Mucous membranes are moist.     Pharynx: Oropharynx is clear.  Cardiovascular:     Rate and Rhythm: Normal rate.  Pulmonary:     Effort: Pulmonary effort is normal.  Abdominal:     Tenderness: There is no abdominal tenderness.  Skin:    General: Skin is warm and dry.  Neurological:     Mental Status: He is alert and oriented to person, place, and time.  Psychiatric:        Mood and Affect: Mood normal.        Behavior: Behavior normal.        Thought Content: Thought content normal.        Judgment: Judgment normal.     Imaging: CT CHEST ABDOMEN PELVIS W CONTRAST  Result Date: 11/17/2022 CLINICAL DATA:  History of CLL assess for organomegaly and lymphadenopathy. * Tracking Code: BO * EXAM: CT CHEST, ABDOMEN, AND PELVIS WITH CONTRAST TECHNIQUE: Multidetector CT imaging of the chest, abdomen and pelvis was performed following the standard protocol during bolus administration of intravenous contrast. RADIATION DOSE  REDUCTION: This exam was performed according to the departmental dose-optimization program which includes automated exposure control, adjustment of the mA and/or kV according to patient size and/or use of iterative reconstruction technique. CONTRAST:  OMNIPAQUE IOHEXOL 300 MG/ML  SOLN COMPARISON:  Multiple priors including PET/CT December 22, 2021. FINDINGS: CT CHEST FINDINGS Cardiovascular: Aortic atherosclerosis. No central pulmonary embolus on this nondedicated study. Aortic valve prosthesis. Calcifications in the mitral annulus. Coronary artery calcifications. Median sternotomy wires. Mediastinum/Nodes: No suspicious thyroid nodule. No pathologically enlarged mediastinal, hilar or axillary lymph nodes. The esophagus is grossly unremarkable. Lungs/Pleura: No suspicious pulmonary nodules or masses. Bibasilar scarring/atelectasis. Musculoskeletal: Prior median sternotomy. No aggressive lytic or blastic lesion of bone. Unchanged wedging deformity of the T12 vertebral body. CT ABDOMEN PELVIS FINDINGS Hepatobiliary: No suspicious hepatic lesion. Gallbladder is unremarkable. No biliary ductal dilation. Pancreas: No pancreatic ductal dilation or evidence of acute inflammation. Spleen: No splenomegaly. Adrenals/Urinary Tract: Bilateral adrenal glands appear normal. No hydronephrosis. Kidneys demonstrate symmetric enhancement. Urinary bladder is unremarkable for degree of distension. Stomach/Bowel: Radiopaque enteric contrast material traverses distal loops of small bowel. Stomach is unremarkable for degree of distension. No pathologic dilation of small or large bowel. Colonic diverticulosis without findings of acute diverticulitis. Vascular/Lymphatic: Aortic atherosclerosis. Smooth IVC contours. The portal, splenic and superior mesenteric veins are patent. Enlarged iliac side chain and pelvic sidewall lymph nodes. For reference: -left internal iliac lymph node measures 12 mm in short axis on image 114/2 -right  internal iliac lymph node measures 13 mm in short axis on image 112/2 -left obturator lymph node measures 17 mm in short axis on image 117/2. Reproductive: Brachytherapy seeds in the prostate gland. Other: No aggressive lytic or blastic lesion of bone. Musculoskeletal: No aggressive lytic or blastic lesion of bone. 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 Multilevel degenerative changes spine. IMPRESSION: 1. Enlarged iliac side chain and pelvic sidewall lymph nodes, may  reflect CLL or prostate nodal metastasis consider more definitive assessment by PSMA PET-CT. 2. No hepatosplenomegaly. 3. No pathologically enlarged thoracic or abdominal lymph nodes. 4.  Aortic Atherosclerosis (ICD10-I70.0). Electronically Signed   By: Maudry Mayhew M.D.   On: 11/17/2022 15:45    Labs:  CBC: Recent Labs    09/08/22 1251 11/06/22 0848  WBC 16.4* 31.4*  HGB 13.4 13.9  HCT 39.1 41.2  PLT 184 177    COAGS: No results for input(s): "INR", "APTT" in the last 8760 hours.  BMP: Recent Labs    09/08/22 1251 11/06/22 0848  NA 140 139  K 4.4 3.9  CL 106 107  CO2 28 25  GLUCOSE 117* 113*  BUN 21 22  CALCIUM 9.1 9.3  CREATININE 0.94 0.96  GFRNONAA >60 >60    LIVER FUNCTION TESTS: Recent Labs    09/08/22 1251 11/06/22 0848  BILITOT 1.0 1.4*  AST 18 16  ALT 19 19  ALKPHOS 54 51  PROT 5.9* 6.0*  ALBUMIN 4.1 4.0    TUMOR MARKERS: No results for input(s): "AFPTM", "CEA", "CA199", "CHROMGRNA" in the last 8760 hours.  Assessment and Plan:  Chronic lymphocytic leukemia: Tim Hardy, 82 year old male, presents today to the [facility] Interventional Radiology department for an image-guided bone marrow biopsy with aspiration.   Risks and benefits of this procedure were discussed with the patient and/or patient's family including, but not limited to bleeding, infection, damage to adjacent structures or low yield requiring additional tests.  All of the questions  were answered and there is agreement to proceed. He has been NPO. He is a full code.   Consent signed and in chart.  Thank you for this interesting consult.  I greatly enjoyed meeting GAL LEFTON and look forward to participating in their care.  A copy of this report was sent to the requesting provider on this date.  Electronically Signed: Alwyn Ren, AGACNP-BC [phone] 12/04/2022, 10:00 AM   I spent a total of  30 Minutes   in face to face in clinical consultation, greater than 50% of which was counseling/coordinating care for bone marrow biopsy and aspiration.

## 2022-12-04 NOTE — Discharge Instructions (Signed)
Please call Interventional Radiology clinic [phone] with any questions or concerns.  You may remove your dressing and shower tomorrow.  After the procedure, it is common to have: Soreness Bruising Mild pain  Follow these instructions at home:  Medication: Do not use Aspirin or ibuprofen products, such as Advil or Motrin, as it may increase bleeding You may resume your usual medications as ordered by your doctor If your doctor prescribed antibiotics, take them as directed. Do not stop taking them just because you feel better. You need to take the full course of antibiotics  Eating and drinking: Drink plenty of liquids to keep your urine pale yellow You can resume your regular diet as directed by your doctor   Care of the procedure site  Check your biopsy site every day until it heals  Keep the bandage dry. You can take the bandage off and shower tomorrow  If you are bleeding from the biopsy site, apply firm pressure on the area for at least 30 minutes  If directed, apply ice to your biopsy site 2-3 times a day.  o Put ice in a plastic bag  o Place a towel between your skin and the ice bag  o Leave the ice in place for 20 minutes 2-3 times a day  Activity Do not take baths, swim, or use a hot tub until your health care provider approves  Keep all follow-up visits as told by your doctor  Contact your doctor or seek immediate medical care if: You have bright red bleeding from the biopsy site that does not stop after 30 minutes of holding direct pressure on the site. You have signs of infection, such as: Increased pain, swelling, warmth, or redness at the biopsy site Red streaks leading from the biopsy site Yellow or green drainage from the biopsy site A fever (temperature over 100.49F) chills, or both    Moderate Conscious Sedation-Care After  This sheet gives you information about how to care for yourself after your procedure. Your health care provider may also give you  more specific instructions. If you have problems or questions, contact your health care provider.  After the procedure, it is common to have: Sleepiness for several hours. Impaired judgment for several hours. Difficulty with balance. Vomiting if you eat too soon.  Follow these instructions at home:  Rest. Do not participate in activities where you could fall or become injured. Do not drive or use machinery. Do not drink alcohol. Do not take sleeping pills or medicines that cause drowsiness. Do not make important decisions or sign legal documents. Do not take care of children on your own.  Eating and drinking Follow the diet recommended by your health care provider. Drink enough fluid to keep your urine pale yellow. If you vomit: Drink water, juice, or soup when you can drink without vomiting. Make sure you have little or no nausea before eating solid foods.  General instructions Take over-the-counter and prescription medicines only as told by your health care provider. Have a responsible adult stay with you for the time you are told. It is important to have someone help care for you until you are awake and alert. Do not smoke. Keep all follow-up visits as told by your health care provider. This is important.  Contact a health care provider if: You are still sleepy or having trouble with balance after 24 hours. You feel light-headed. You keep feeling nauseous or you keep vomiting. You develop a rash. You have a fever. You  have redness or swelling around the IV site.  Get help right away if: You have trouble breathing. You have new-onset confusion at home.  This information is not intended to replace advice given to you by your health care provider. Make sure you discuss any questions you have with your healthcare provider.

## 2022-12-04 NOTE — Procedures (Signed)
Pre-procedure Diagnosis: CLL Post-procedure Diagnosis: Same  Technically successful CT guided bone marrow aspiration and biopsy of the right iliac crest.   Complications: None Immediate  EBL: None  Signed: Simonne Come Pager: [phone] 12/04/2022, 11:16 AM

## 2022-12-08 ENCOUNTER — Inpatient Hospital Stay: Payer: Medicare Other

## 2022-12-08 ENCOUNTER — Telehealth: Payer: Self-pay

## 2022-12-08 ENCOUNTER — Inpatient Hospital Stay: Payer: Medicare Other | Admitting: Physician Assistant

## 2022-12-08 NOTE — Telephone Encounter (Signed)
Confirmed lab at 3 on 11/6 with pt's wife.

## 2022-12-08 NOTE — Telephone Encounter (Signed)
Spoke with pt's wife about appt.  Results of the biopsy will not be back by today.  Changed appt to 11/6 @ 3:30.    Called and LVM about lab appt at 3 same day. If any concerns or questions, asked for a return call.

## 2022-12-09 LAB — SURGICAL PATHOLOGY

## 2022-12-10 ENCOUNTER — Inpatient Hospital Stay: Payer: Medicare Other | Attending: Physician Assistant | Admitting: Internal Medicine

## 2022-12-10 ENCOUNTER — Inpatient Hospital Stay: Payer: Medicare Other

## 2022-12-10 VITALS — BP 125/70 | HR 77 | Temp 98.2°F | Resp 18 | Ht 73.0 in | Wt 234.3 lb

## 2022-12-10 DIAGNOSIS — Z8546 Personal history of malignant neoplasm of prostate: Secondary | ICD-10-CM | POA: Insufficient documentation

## 2022-12-10 DIAGNOSIS — C9110 Chronic lymphocytic leukemia of B-cell type not having achieved remission: Secondary | ICD-10-CM | POA: Insufficient documentation

## 2022-12-10 LAB — CBC WITH DIFFERENTIAL (CANCER CENTER ONLY)
Abs Immature Granulocytes: 0.09 10*3/uL — ABNORMAL HIGH (ref 0.00–0.07)
Basophils Absolute: 0.1 10*3/uL (ref 0.0–0.1)
Basophils Relative: 0 %
Eosinophils Absolute: 0.2 10*3/uL (ref 0.0–0.5)
Eosinophils Relative: 1 %
HCT: 38.6 % — ABNORMAL LOW (ref 39.0–52.0)
Hemoglobin: 13 g/dL (ref 13.0–17.0)
Immature Granulocytes: 0 %
Lymphocytes Relative: 68 %
Lymphs Abs: 14 10*3/uL — ABNORMAL HIGH (ref 0.7–4.0)
MCH: 31.9 pg (ref 26.0–34.0)
MCHC: 33.7 g/dL (ref 30.0–36.0)
MCV: 94.8 fL (ref 80.0–100.0)
Monocytes Absolute: 0.9 10*3/uL (ref 0.1–1.0)
Monocytes Relative: 4 %
Neutro Abs: 5.7 10*3/uL (ref 1.7–7.7)
Neutrophils Relative %: 27 %
Platelet Count: 189 10*3/uL (ref 150–400)
RBC: 4.07 MIL/uL — ABNORMAL LOW (ref 4.22–5.81)
RDW: 13.3 % (ref 11.5–15.5)
Smear Review: NORMAL
WBC Count: 20.9 10*3/uL — ABNORMAL HIGH (ref 4.0–10.5)
nRBC: 0 % (ref 0.0–0.2)

## 2022-12-10 NOTE — Progress Notes (Signed)
[facility] Telephone:(336) [phone]   Fax:([phone]  OFFICE PROGRESS NOTE  Emilio Aspen, MD 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 Kathrynn Running)    PRIOR THERAPY: None   CURRENT THERAPY: Observation   INTERVAL HISTORY: Tim Hardy 82 y.o. male returns to the clinic today for follow-up visit accompanied by his wife.Discussed the use of AI scribe software for clinical note transcription with the patient, who gave verbal consent to proceed.  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 The patient's blood count has been closely monitored since diagnosis, with a concerning increase to 31.4 in October 2024. This prompted a bone marrow biopsy and a CT scan. The CT scan revealed lymph nodes in the abdomen, the origin of which was unclear due to the patient's history of prostate cancer. The bone marrow biopsy confirmed the presence of CLL cells. However, the patient's blood count has since decreased to 20.9.  The patient has not reported any symptoms such as anemia, low platelets, enlarged spleen or liver, or problematic swollen lymph nodes. There were some lymph nodes noted in the groin area on the CT scan, but they were not significantly large. The patient's PSA was reported as 0.01, suggesting that the lymph nodes are likely related to the CLL rather than the prostate cancer.  The patient has not reported any pain or other symptoms, except for an upcoming cataract surgery. The patient's overall condition appears stable, with no changes in medication or other significant health concerns reported.       MEDICAL HISTORY: Past Medical History:  Diagnosis Date   Asthma    Basal cell adenocarcinoma    BPH  (benign prostatic hyperplasia)    Bronchitis    Cancer (HCC)    skin cancer removed in August   Colon polyps    Colon, diverticulosis    Diastolic dysfunction    Dilated aortic root (HCC)    ED (erectile dysfunction)    GERD (gastroesophageal reflux disease)    Heart murmur    Hiatal hernia    History of aortic valve replacement with bioprosthetic valve    Incarcerated umbilical hernia 10/20/2018   Mild CAD    Perennial allergic rhinitis    Shortness of breath    Thoracic ascending aortic aneurysm (HCC)     ALLERGIES:  is allergic to oxycontin [oxycodone hcl] and penicillins.  MEDICATIONS:  Current Outpatient Medications  Medication Sig Dispense Refill   acetaminophen (TYLENOL) 500 MG tablet Take 500 mg by mouth every 6 (six) hours as needed.     ALPRAZolam (XANAX) 0.5 MG tablet Take 0.25-0.5 mg by mouth 2 (two) times daily as needed.     aspirin EC 81 MG tablet Take 81 mg by mouth every morning.      atorvastatin (LIPITOR) 10 MG tablet Take 5 mg by mouth daily.     azithromycin (ZITHROMAX) 250 MG tablet Take 2 tabs by mouth 30-60 minutes prior to dental work 2 each 1   cetirizine (ZYRTEC) 10 MG tablet Take 10 mg by mouth as needed.     Cholecalciferol (VITAMIN D PO) Take 1 tablet by mouth every morning. Per patient taking 1,000 mcg     Cyanocobalamin (B-12 PO) Take 1  tablet by mouth every morning.      doxycycline (VIBRA-TABS) 100 MG tablet Take 1 tablet (100 mg total) by mouth 2 (two) times daily. 20 tablet 0   FLOVENT HFA 110 MCG/ACT inhaler Inhale 1 puff into the lungs 2 (two) times daily.     fluticasone (FLONASE) 50 MCG/ACT nasal spray Place 2 sprays into the nose daily as needed for allergies.     HYDROcodone-acetaminophen (NORCO) 5-325 MG tablet Take 1-2 tablets by mouth every 6 (six) hours as needed for moderate pain or severe pain. 20 tablet 0   losartan (COZAAR) 50 MG tablet Take 50 mg by mouth 2 (two) times daily.  Pt takes 1 tablet in morning and 1 tablet at night.      Multiple Vitamin (MULTIVITAMIN WITH MINERALS) TABS Take 1 tablet by mouth every morning.      omeprazole (PRILOSEC) 10 MG capsule Take 1 capsule by mouth daily.     Tamsulosin HCl (FLOMAX) 0.4 MG CAPS Take 0.4 mg by mouth daily after supper.      No current facility-administered medications for this visit.    SURGICAL HISTORY:  Past Surgical History:  Procedure Laterality Date   AORTIC VALVE REPLACEMENT  11/03/2011   Procedure: AORTIC VALVE REPLACEMENT (AVR);  Surgeon: Loreli Slot, MD;  Location: [facility] OR;  Service: Open Heart Surgery;  Laterality: N/A;  Partial Sternotomy   APPENDECTOMY     CARDIAC CATHETERIZATION     COLONOSCOPY W/ POLYPECTOMY     COLONOSCOPY WITH PROPOFOL N/A 07/11/2014   Procedure: COLONOSCOPY WITH PROPOFOL;  Surgeon: Charolett Bumpers, MD;  Location: [facility];  Service: Endoscopy;  Laterality: N/A;   ESOPHAGOGASTRODUODENOSCOPY (EGD) WITH PROPOFOL N/A 07/11/2014   Procedure: ESOPHAGOGASTRODUODENOSCOPY (EGD) WITH PROPOFOL;  Surgeon: Charolett Bumpers, MD;  Location: [facility];  Service: Endoscopy;  Laterality: N/A;   FEMORAL HERNIA REPAIR     , x5   HERNIA REPAIR Bilateral    KNEE ARTHROSCOPY Bilateral    SHOULDER ARTHROSCOPY Right    STERNOTOMY  11/03/2011   Procedure: STERNOTOMY;  Surgeon: Loreli Slot, MD;  Location: [facility] OR;  Service: Open Heart Surgery;  Laterality: N/A;  Partial   TONSILLECTOMY     UMBILICAL HERNIA REPAIR N/A 10/20/2018   Procedure: OPEN REPAIR UMBILICAL HERNIA WITH  MESH;  Surgeon: Claud Kelp, MD;  Location: [facility];  Service: General;  Laterality: N/A;    REVIEW OF SYSTEMS:  Constitutional: negative Eyes: negative Ears, nose, mouth, throat, and face: negative Respiratory: negative Cardiovascular: negative Gastrointestinal: negative Genitourinary:negative Integument/breast: negative Hematologic/lymphatic: negative Musculoskeletal:negative Neurological: negative Behavioral/Psych:  negative Endocrine: negative Allergic/Immunologic: negative   PHYSICAL EXAMINATION: General appearance: alert, cooperative, and no distress Head: Normocephalic, without obvious abnormality, atraumatic Neck: no adenopathy, no JVD, supple, symmetrical, trachea midline, and thyroid not enlarged, symmetric, no tenderness/mass/nodules Lymph nodes: Cervical, supraclavicular, and axillary nodes normal. Resp: clear to auscultation bilaterally Back: symmetric, no curvature. ROM normal. No CVA tenderness. Cardio: regular rate and rhythm, S1, S2 normal, no murmur, click, rub or gallop GI: soft, non-tender; bowel sounds normal; no masses,  no organomegaly Extremities: extremities normal, atraumatic, no cyanosis or edema Neurologic: Alert and oriented X 3, normal strength and tone. Normal symmetric reflexes. Normal coordination and gait  ECOG PERFORMANCE STATUS: 1 - Symptomatic but completely ambulatory  Blood pressure 125/70, pulse 77, temperature 98.2 F (36.8 C), temperature source Oral, resp. rate 18, height 6\' 1"  (1.854 m), weight 234 lb 4.8 oz (106.3 kg), SpO2 98%.  LABORATORY DATA:  Lab Results  Component Value Date   WBC 20.9 (H) 12/10/2022   HGB 13.0 12/10/2022   HCT 38.6 (L) 12/10/2022   MCV 94.8 12/10/2022   PLT 189 12/10/2022      Chemistry      Component Value Date/Time   NA 139 11/06/2022 0848   NA 143 06/04/2021 1409   K 3.9 11/06/2022 0848   CL 107 11/06/2022 0848   CO2 25 11/06/2022 0848   BUN 22 11/06/2022 0848   BUN 10 06/04/2021 1409   CREATININE 0.96 11/06/2022 0848   CREATININE 1.14 10/15/2015 0811      Component Value Date/Time   CALCIUM 9.3 11/06/2022 0848   ALKPHOS 51 11/06/2022 0848   AST 16 11/06/2022 0848   ALT 19 11/06/2022 0848   BILITOT 1.4 (H) 11/06/2022 0848       RADIOGRAPHIC STUDIES: CT BONE MARROW BIOPSY & ASPIRATION  Result Date: 12/04/2022 INDICATION: Presumed CLL. Please perform CT-guided bone marrow biopsy for tissue diagnostic  purposes. EXAM: CT-GUIDED BONE MARROW BIOPSY AND ASPIRATION MEDICATIONS: None ANESTHESIA/SEDATION: Moderate (conscious) sedation was employed during this procedure as administered by the Interventional Radiology RN. A total of Versed 3 mg and Fentanyl 100 mcg was administered intravenously. Moderate Sedation Time: 10 minutes. The patient's level of consciousness and vital signs were monitored continuously by radiology nursing throughout the procedure under my direct supervision. COMPLICATIONS: None immediate. PROCEDURE: Informed consent was obtained from the patient following an explanation of the procedure, risks, benefits and alternatives. The patient understands, agrees and consents for the procedure. All questions were addressed. A time out was performed prior to the initiation of the procedure. The patient was positioned prone and non-contrast localization CT was performed of the pelvis to demonstrate the iliac marrow spaces. The operative site was prepped and draped in the usual sterile fashion. Under sterile conditions and local anesthesia, a 22 gauge spinal needle was utilized for procedural planning. Next, an 11 gauge coaxial bone biopsy needle was advanced into the right iliac marrow space. Needle position was confirmed with CT imaging. Initially, a bone marrow aspiration was performed. Next, a bone marrow biopsy was obtained with the 11 gauge outer bone marrow device. The needle was removed and superficial hemostasis was obtained with manual compression. A dressing was applied. The patient tolerated the procedure well without immediate post procedural complication. IMPRESSION: Successful CT guided right iliac bone marrow aspiration and core biopsy. Electronically Signed   By: Simonne Come M.D.   On: 12/04/2022 13:33   CT CHEST ABDOMEN PELVIS W CONTRAST  Result Date: 11/17/2022 CLINICAL DATA:  History of CLL assess for organomegaly and lymphadenopathy. * Tracking Code: BO * EXAM: CT CHEST, ABDOMEN, AND  PELVIS WITH CONTRAST TECHNIQUE: Multidetector CT imaging of the chest, abdomen and pelvis was performed following the standard protocol during bolus administration of intravenous contrast. RADIATION DOSE REDUCTION: This exam was performed according to the departmental dose-optimization program which includes automated exposure control, adjustment of the mA and/or kV according to patient size and/or use of iterative reconstruction technique. CONTRAST:  OMNIPAQUE IOHEXOL 300 MG/ML  SOLN COMPARISON:  Multiple priors including PET/CT December 22, 2021. FINDINGS: CT CHEST FINDINGS Cardiovascular: Aortic atherosclerosis. No central pulmonary embolus on this nondedicated study. Aortic valve prosthesis. Calcifications in the mitral annulus. Coronary artery calcifications. Median sternotomy wires. Mediastinum/Nodes: No suspicious thyroid nodule. No pathologically enlarged mediastinal, hilar or axillary lymph nodes. The esophagus is grossly unremarkable. Lungs/Pleura: No suspicious pulmonary nodules or masses. Bibasilar scarring/atelectasis. Musculoskeletal: Prior median sternotomy.  No aggressive lytic or blastic lesion of bone. Unchanged wedging deformity of the T12 vertebral body. CT ABDOMEN PELVIS FINDINGS Hepatobiliary: No suspicious hepatic lesion. Gallbladder is unremarkable. No biliary ductal dilation. Pancreas: No pancreatic ductal dilation or evidence of acute inflammation. Spleen: No splenomegaly. Adrenals/Urinary Tract: Bilateral adrenal glands appear normal. No hydronephrosis. Kidneys demonstrate symmetric enhancement. Urinary bladder is unremarkable for degree of distension. Stomach/Bowel: Radiopaque enteric contrast material traverses distal loops of small bowel. Stomach is unremarkable for degree of distension. No pathologic dilation of small or large bowel. Colonic diverticulosis without findings of acute diverticulitis. Vascular/Lymphatic: Aortic atherosclerosis. Smooth IVC contours. The portal, splenic  and superior mesenteric veins are patent. Enlarged iliac side chain and pelvic sidewall lymph nodes. For reference: -left internal iliac lymph node measures 12 mm in short axis on image 114/2 -right internal iliac lymph node measures 13 mm in short axis on image 112/2 -left obturator lymph node measures 17 mm in short axis on image 117/2. Reproductive: Brachytherapy seeds in the prostate gland. Other: No aggressive lytic or blastic lesion of bone. Musculoskeletal: No aggressive lytic or blastic lesion of bone. 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 Multilevel degenerative changes spine. IMPRESSION: 1. Enlarged iliac side chain and pelvic sidewall lymph nodes, may reflect CLL or prostate nodal metastasis consider more definitive assessment by PSMA PET-CT. 2. No hepatosplenomegaly. 3. No pathologically enlarged thoracic or abdominal lymph nodes. 4.  Aortic Atherosclerosis (ICD10-I70.0). Electronically Signed   By: Maudry Mayhew M.D.   On: 11/17/2022 15:45    ASSESSMENT AND PLAN:     Chronic Lymphocytic Leukemia (CLL) Diagnosed in June 2024. Recent bone marrow biopsy confirmed presence of CLL cells. Lymphadenopathy noted on CT scan, likely related to CLL given low PSA. WBC count initially increased to 31.4 but has since decreased to 20.9. No current symptoms or signs of anemia, thrombocytopenia, hepatosplenomegaly, or significant lymphadenopathy. Discussed criteria for treatment initiation, including rapid doubling of WBC count, development of symptoms, or organomegaly. -Continue monitoring without treatment at this time. -Repeat blood work in three months.  Prostate Cancer History of prostate cancer, current PSA 0.01. -Continue monitoring with regular PSA checks.  Cataract Surgery Upcoming cataract surgery. -Clearance for surgery given.  Follow-up in 3 months or sooner if symptoms develop.   The patient was advised to call immediately if he has any other concerning  symptoms in the interval. The patient voices understanding of current disease status and treatment options and is in agreement with the current care plan.  All questions were answered. The patient knows to call the clinic with any problems, questions or concerns. We can certainly see the patient much sooner if necessary.  The total time spent in the appointment was 30 minutes.  Disclaimer: This note was dictated with voice recognition software. Similar sounding words can inadvertently be transcribed and may not be corrected upon review.

## 2022-12-15 ENCOUNTER — Encounter (HOSPITAL_COMMUNITY): Payer: Self-pay

## 2022-12-22 LAB — SURGICAL PATHOLOGY

## 2023-01-23 DIAGNOSIS — M79609 Pain in unspecified limb: Secondary | ICD-10-CM | POA: Diagnosis not present

## 2023-01-23 DIAGNOSIS — I700 Atherosclerosis of aorta: Secondary | ICD-10-CM | POA: Diagnosis not present

## 2023-01-23 DIAGNOSIS — E7800 Pure hypercholesterolemia, unspecified: Secondary | ICD-10-CM | POA: Diagnosis not present

## 2023-01-23 DIAGNOSIS — R7303 Prediabetes: Secondary | ICD-10-CM | POA: Diagnosis not present

## 2023-01-23 DIAGNOSIS — C9110 Chronic lymphocytic leukemia of B-cell type not having achieved remission: Secondary | ICD-10-CM | POA: Diagnosis not present

## 2023-01-23 DIAGNOSIS — J45998 Other asthma: Secondary | ICD-10-CM | POA: Diagnosis not present

## 2023-01-23 DIAGNOSIS — Z23 Encounter for immunization: Secondary | ICD-10-CM | POA: Diagnosis not present

## 2023-01-23 DIAGNOSIS — K219 Gastro-esophageal reflux disease without esophagitis: Secondary | ICD-10-CM | POA: Diagnosis not present

## 2023-01-23 DIAGNOSIS — I10 Essential (primary) hypertension: Secondary | ICD-10-CM | POA: Diagnosis not present

## 2023-01-23 DIAGNOSIS — Z79899 Other long term (current) drug therapy: Secondary | ICD-10-CM | POA: Diagnosis not present

## 2023-02-05 ENCOUNTER — Other Ambulatory Visit (HOSPITAL_COMMUNITY): Payer: Self-pay | Admitting: Urology

## 2023-02-05 DIAGNOSIS — C61 Malignant neoplasm of prostate: Secondary | ICD-10-CM

## 2023-02-16 DIAGNOSIS — H52222 Regular astigmatism, left eye: Secondary | ICD-10-CM | POA: Diagnosis not present

## 2023-02-16 DIAGNOSIS — I10 Essential (primary) hypertension: Secondary | ICD-10-CM | POA: Diagnosis not present

## 2023-02-16 DIAGNOSIS — H2513 Age-related nuclear cataract, bilateral: Secondary | ICD-10-CM | POA: Diagnosis not present

## 2023-02-16 DIAGNOSIS — H524 Presbyopia: Secondary | ICD-10-CM | POA: Diagnosis not present

## 2023-02-16 DIAGNOSIS — H35033 Hypertensive retinopathy, bilateral: Secondary | ICD-10-CM | POA: Diagnosis not present

## 2023-03-02 ENCOUNTER — Encounter (HOSPITAL_COMMUNITY)
Admission: RE | Admit: 2023-03-02 | Discharge: 2023-03-02 | Disposition: A | Discharge status: Home / Self Care | Payer: Medicare Other | Source: Ambulatory Visit | Attending: Urology | Admitting: Urology

## 2023-03-02 DIAGNOSIS — C61 Malignant neoplasm of prostate: Secondary | ICD-10-CM | POA: Diagnosis present

## 2023-03-02 MED ORDER — FLOTUFOLASTAT F 18 GALLIUM 296-5846 MBQ/ML IV SOLN
8.0000 | Freq: Once | INTRAVENOUS | Status: AC
  Administered 2023-03-02: 8.34 via INTRAVENOUS
  Filled 2023-03-02: qty 8

## 2023-03-09 ENCOUNTER — Inpatient Hospital Stay: Payer: Medicare Other | Attending: Physician Assistant

## 2023-03-09 ENCOUNTER — Inpatient Hospital Stay: Payer: Medicare Other | Admitting: Internal Medicine

## 2023-03-09 VITALS — BP 145/61 | HR 78 | Temp 97.7°F | Resp 17 | Ht 73.0 in | Wt 239.0 lb

## 2023-03-09 DIAGNOSIS — C9110 Chronic lymphocytic leukemia of B-cell type not having achieved remission: Secondary | ICD-10-CM | POA: Diagnosis not present

## 2023-03-09 DIAGNOSIS — Z79899 Other long term (current) drug therapy: Secondary | ICD-10-CM | POA: Insufficient documentation

## 2023-03-09 DIAGNOSIS — C61 Malignant neoplasm of prostate: Secondary | ICD-10-CM | POA: Insufficient documentation

## 2023-03-09 LAB — CBC WITH DIFFERENTIAL (CANCER CENTER ONLY)
Abs Immature Granulocytes: 0.04 10*3/uL (ref 0.00–0.07)
Basophils Absolute: 0.1 10*3/uL (ref 0.0–0.1)
Basophils Relative: 0 %
Eosinophils Absolute: 0.3 10*3/uL (ref 0.0–0.5)
Eosinophils Relative: 2 %
HCT: 39.8 % (ref 39.0–52.0)
Hemoglobin: 13.2 g/dL (ref 13.0–17.0)
Immature Granulocytes: 0 %
Lymphocytes Relative: 59 %
Lymphs Abs: 8.8 10*3/uL — ABNORMAL HIGH (ref 0.7–4.0)
MCH: 30.8 pg (ref 26.0–34.0)
MCHC: 33.2 g/dL (ref 30.0–36.0)
MCV: 93 fL (ref 80.0–100.0)
Monocytes Absolute: 0.8 10*3/uL (ref 0.1–1.0)
Monocytes Relative: 5 %
Neutro Abs: 5.1 10*3/uL (ref 1.7–7.7)
Neutrophils Relative %: 34 %
Platelet Count: 177 10*3/uL (ref 150–400)
RBC: 4.28 MIL/uL (ref 4.22–5.81)
RDW: 12.7 % (ref 11.5–15.5)
Smear Review: NORMAL
WBC Count: 15.1 10*3/uL — ABNORMAL HIGH (ref 4.0–10.5)
nRBC: 0 % (ref 0.0–0.2)

## 2023-03-09 LAB — CMP (CANCER CENTER ONLY)
ALT: 15 U/L (ref 0–44)
AST: 20 U/L (ref 15–41)
Albumin: 4.1 g/dL (ref 3.5–5.0)
Alkaline Phosphatase: 57 U/L (ref 38–126)
Anion gap: 5 (ref 5–15)
BUN: 15 mg/dL (ref 8–23)
CO2: 27 mmol/L (ref 22–32)
Calcium: 9.2 mg/dL (ref 8.9–10.3)
Chloride: 107 mmol/L (ref 98–111)
Creatinine: 1.05 mg/dL (ref 0.61–1.24)
GFR, Estimated: >60 mL/min (ref >60)
Glucose, Bld: 109 mg/dL — ABNORMAL HIGH (ref 70–99)
Potassium: 3.9 mmol/L (ref 3.5–5.1)
Sodium: 139 mmol/L (ref 135–145)
Total Bilirubin: 0.9 mg/dL (ref 0.0–1.2)
Total Protein: 5.9 g/dL — ABNORMAL LOW (ref 6.5–8.1)

## 2023-03-09 LAB — LACTATE DEHYDROGENASE: LDH: 192 U/L (ref 98–192)

## 2023-03-09 NOTE — Progress Notes (Signed)
[facility] Telephone:(336) [phone]   Fax:(336) [phone]  OFFICE PROGRESS NOTE  Emilio Aspen, MD 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 Kathrynn Running)    PRIOR THERAPY: None   CURRENT THERAPY: Observation   INTERVAL HISTORY: Tim Hardy 83 y.o. male returns to the clinic today for follow-up visit accompanied by his wife.Discussed the use of AI scribe software for clinical note transcription with the patient, who gave verbal consent to proceed.  History of Present Illness   The patient, with chronic lymphocytic leukemia, presents for follow-up. He was referred by Dr. Darvin Neighbours for management of CLL.  He has no new complaints related to chronic lymphocytic leukemia (CLL). There is no lymphadenopathy, night sweats, or weight loss. He occasionally checks his lymph nodes and has not noticed any changes.  He has a history of a chromosomal abnormality, specifically a q11 deletion, which is considered a high-risk factor for CLL progression. However, he has not experienced any symptoms related to this abnormality at present.  Recent laboratory results show a decrease in his total white blood cell count from 20.9 in November to 15.1 currently. He does not have anemia or thrombocytopenia.  A recent PSMA PET scan for prostate evaluation was conducted, and the results were favorable, showing no signs of growth.  No nausea, vomiting, diarrhea, or headaches.       MEDICAL HISTORY: Past Medical History:  Diagnosis Date   Asthma    Basal cell adenocarcinoma    BPH (benign prostatic hyperplasia)    Bronchitis    Cancer (HCC)    skin cancer removed in August   Colon polyps    Colon, diverticulosis     Diastolic dysfunction    Dilated aortic root (HCC)    ED (erectile dysfunction)    GERD (gastroesophageal reflux disease)    Heart murmur    Hiatal hernia    History of aortic valve replacement with bioprosthetic valve    Incarcerated umbilical hernia 10/20/2018   Mild CAD    Perennial allergic rhinitis    Shortness of breath    Thoracic ascending aortic aneurysm (HCC)     ALLERGIES:  is allergic to oxycontin [oxycodone hcl] and penicillins.  MEDICATIONS:  Current Outpatient Medications  Medication Sig Dispense Refill   acetaminophen (TYLENOL) 500 MG tablet Take 500 mg by mouth every 6 (six) hours as needed.     ALPRAZolam (XANAX) 0.5 MG tablet Take 0.25-0.5 mg by mouth 2 (two) times daily as needed.     aspirin EC 81 MG tablet Take 81 mg by mouth every morning.      atorvastatin (LIPITOR) 10 MG tablet Take 5 mg by mouth daily.     azithromycin (ZITHROMAX) 250 MG tablet Take 2 tabs by mouth 30-60 minutes prior to dental work 2 each 1   cetirizine (ZYRTEC) 10 MG tablet Take 10 mg by mouth as needed.     Cholecalciferol (VITAMIN D PO) Take 1 tablet by mouth every morning. Per patient taking 1,000 mcg     Cyanocobalamin (B-12 PO) Take 1 tablet by mouth every morning.      doxycycline (VIBRA-TABS) 100 MG tablet Take 1 tablet (100 mg total) by mouth 2 (two) times daily. 20 tablet  0   FLOVENT HFA 110 MCG/ACT inhaler Inhale 1 puff into the lungs 2 (two) times daily.     fluticasone (FLONASE) 50 MCG/ACT nasal spray Place 2 sprays into the nose daily as needed for allergies.     HYDROcodone-acetaminophen (NORCO) 5-325 MG tablet Take 1-2 tablets by mouth every 6 (six) hours as needed for moderate pain or severe pain. 20 tablet 0   losartan (COZAAR) 50 MG tablet Take 50 mg by mouth 2 (two) times daily.  Pt takes 1 tablet in morning and 1 tablet at night.     Multiple Vitamin (MULTIVITAMIN WITH MINERALS) TABS Take 1 tablet by mouth every morning.      omeprazole (PRILOSEC) 10 MG capsule Take 1  capsule by mouth daily.     Tamsulosin HCl (FLOMAX) 0.4 MG CAPS Take 0.4 mg by mouth daily after supper.      No current facility-administered medications for this visit.    SURGICAL HISTORY:  Past Surgical History:  Procedure Laterality Date   AORTIC VALVE REPLACEMENT  11/03/2011   Procedure: AORTIC VALVE REPLACEMENT (AVR);  Surgeon: Loreli Slot, MD;  Location: [facility] OR;  Service: Open Heart Surgery;  Laterality: N/A;  Partial Sternotomy   APPENDECTOMY     CARDIAC CATHETERIZATION     COLONOSCOPY W/ POLYPECTOMY     COLONOSCOPY WITH PROPOFOL N/A 07/11/2014   Procedure: COLONOSCOPY WITH PROPOFOL;  Surgeon: Charolett Bumpers, MD;  Location: [facility];  Service: Endoscopy;  Laterality: N/A;   ESOPHAGOGASTRODUODENOSCOPY (EGD) WITH PROPOFOL N/A 07/11/2014   Procedure: ESOPHAGOGASTRODUODENOSCOPY (EGD) WITH PROPOFOL;  Surgeon: Charolett Bumpers, MD;  Location: [facility];  Service: Endoscopy;  Laterality: N/A;   FEMORAL HERNIA REPAIR     , x5   HERNIA REPAIR Bilateral    KNEE ARTHROSCOPY Bilateral    SHOULDER ARTHROSCOPY Right    STERNOTOMY  11/03/2011   Procedure: STERNOTOMY;  Surgeon: Loreli Slot, MD;  Location: [facility] OR;  Service: Open Heart Surgery;  Laterality: N/A;  Partial   TONSILLECTOMY     UMBILICAL HERNIA REPAIR N/A 10/20/2018   Procedure: OPEN REPAIR UMBILICAL HERNIA WITH  MESH;  Surgeon: Claud Kelp, MD;  Location: [facility];  Service: General;  Laterality: N/A;    REVIEW OF SYSTEMS:  A comprehensive review of systems was negative.   PHYSICAL EXAMINATION: General appearance: alert, cooperative, and no distress Head: Normocephalic, without obvious abnormality, atraumatic Neck: no adenopathy, no JVD, supple, symmetrical, trachea midline, and thyroid not enlarged, symmetric, no tenderness/mass/nodules Lymph nodes: Cervical, supraclavicular, and axillary nodes normal. Resp: clear to auscultation bilaterally Back: symmetric, no curvature. ROM normal.  No CVA tenderness. Cardio: regular rate and rhythm, S1, S2 normal, no murmur, click, rub or gallop GI: soft, non-tender; bowel sounds normal; no masses,  no organomegaly Extremities: extremities normal, atraumatic, no cyanosis or edema  ECOG PERFORMANCE STATUS: 1 - Symptomatic but completely ambulatory  Blood pressure (!) 145/61, pulse 78, temperature 97.7 F (36.5 C), temperature source Temporal, resp. rate 17, height 6\' 1"  (1.854 m), weight 239 lb (108.4 kg), SpO2 98%.  LABORATORY DATA: Lab Results  Component Value Date   WBC 15.1 (H) 03/09/2023   HGB 13.2 03/09/2023   HCT 39.8 03/09/2023   MCV 93.0 03/09/2023   PLT 177 03/09/2023      Chemistry      Component Value Date/Time   NA 139 11/06/2022 0848   NA 143 06/04/2021 1409   K 3.9 11/06/2022 0848   CL 107 11/06/2022 0848  CO2 25 11/06/2022 0848   BUN 22 11/06/2022 0848   BUN 10 06/04/2021 1409   CREATININE 0.96 11/06/2022 0848   CREATININE 1.14 10/15/2015 0811      Component Value Date/Time   CALCIUM 9.3 11/06/2022 0848   ALKPHOS 51 11/06/2022 0848   AST 16 11/06/2022 0848   ALT 19 11/06/2022 0848   BILITOT 1.4 (H) 11/06/2022 0848       RADIOGRAPHIC STUDIES: No results found.  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 Kathrynn Running. The patient is currently on observation and he is feeling fine with no concerning complaints.    Chronic Lymphocytic Leukemia (CLL) CLL with q11 chromosomal deletion, a high-risk factor for rapid progression . WBC decreased from 24.7 in October to 15.1 today, indicating improvement. Asymptomatic with no lymphadenopathy, B symptoms, anemia, or thrombocytopenia. Discussed close monitoring due to Q11 deletion and prompt treatment initiation if changes occur. - Monitor closely due to q11 deletion - Follow-up  in 3 months for blood counts and assessment  Prostate Cancer Prostate cancer post-PSMA PET scan showing no disease progression. Under Dr. Ferne Reus Davis's care. - Continue follow-up with Dr. Darvin Neighbours, next appointment in April or May  General Health Maintenance Discussed weight management; gained three pounds since last visit. - Encourage weight management and healthy lifestyle choices  Follow-up - Follow-up appointment in 3 months for CLL monitoring - Continue follow-up with Dr. Darvin Neighbours for prostate cancer in April or May.   He was advised to call immediately if he has any concerning symptoms in the interval. The patient voices understanding of current disease status and treatment options and is in agreement with the current care plan.  All questions were answered. The patient knows to call the clinic with any problems, questions or concerns. We can certainly see the patient much sooner if necessary.  The total time spent in the appointment was 20 minutes.  Disclaimer: This note was dictated with voice recognition software. Similar sounding words can inadvertently be transcribed and may not be corrected upon review.

## 2023-03-10 ENCOUNTER — Ambulatory Visit: Payer: Medicare Other | Admitting: Internal Medicine

## 2023-03-10 ENCOUNTER — Other Ambulatory Visit: Payer: Medicare Other

## 2023-04-01 DIAGNOSIS — M25562 Pain in left knee: Secondary | ICD-10-CM | POA: Diagnosis not present

## 2023-04-01 DIAGNOSIS — M7062 Trochanteric bursitis, left hip: Secondary | ICD-10-CM | POA: Diagnosis not present

## 2023-04-01 DIAGNOSIS — M7071 Other bursitis of hip, right hip: Secondary | ICD-10-CM | POA: Diagnosis not present

## 2023-04-01 DIAGNOSIS — G8929 Other chronic pain: Secondary | ICD-10-CM | POA: Diagnosis not present

## 2023-06-09 ENCOUNTER — Ambulatory Visit: Payer: Medicare Other | Admitting: Cardiology

## 2023-06-11 ENCOUNTER — Inpatient Hospital Stay: Payer: Medicare Other | Attending: Physician Assistant

## 2023-06-11 ENCOUNTER — Inpatient Hospital Stay: Payer: Medicare Other | Admitting: Internal Medicine

## 2023-06-11 VITALS — BP 120/64 | HR 64 | Temp 97.7°F | Resp 16 | Ht 73.0 in | Wt 235.1 lb

## 2023-06-11 DIAGNOSIS — Z79899 Other long term (current) drug therapy: Secondary | ICD-10-CM | POA: Insufficient documentation

## 2023-06-11 DIAGNOSIS — C9110 Chronic lymphocytic leukemia of B-cell type not having achieved remission: Secondary | ICD-10-CM | POA: Diagnosis not present

## 2023-06-11 DIAGNOSIS — C61 Malignant neoplasm of prostate: Secondary | ICD-10-CM | POA: Diagnosis not present

## 2023-06-11 DIAGNOSIS — Z7982 Long term (current) use of aspirin: Secondary | ICD-10-CM | POA: Insufficient documentation

## 2023-06-11 LAB — CMP (CANCER CENTER ONLY)
ALT: 17 U/L (ref 0–44)
AST: 16 U/L (ref 15–41)
Albumin: 4.1 g/dL (ref 3.5–5.0)
Alkaline Phosphatase: 52 U/L (ref 38–126)
Anion gap: 6 (ref 5–15)
BUN: 19 mg/dL (ref 8–23)
CO2: 29 mmol/L (ref 22–32)
Calcium: 9.3 mg/dL (ref 8.9–10.3)
Chloride: 106 mmol/L (ref 98–111)
Creatinine: 1.01 mg/dL (ref 0.61–1.24)
GFR, Estimated: >60 mL/min (ref >60)
Glucose, Bld: 98 mg/dL (ref 70–99)
Potassium: 4.2 mmol/L (ref 3.5–5.1)
Sodium: 141 mmol/L (ref 135–145)
Total Bilirubin: 1.1 mg/dL (ref 0.0–1.2)
Total Protein: 6.1 g/dL — ABNORMAL LOW (ref 6.5–8.1)

## 2023-06-11 LAB — CBC WITH DIFFERENTIAL (CANCER CENTER ONLY)
Abs Immature Granulocytes: 0.15 10*3/uL — ABNORMAL HIGH (ref 0.00–0.07)
Basophils Absolute: 0.1 10*3/uL (ref 0.0–0.1)
Basophils Relative: 0 %
Eosinophils Absolute: 0.3 10*3/uL (ref 0.0–0.5)
Eosinophils Relative: 1 %
HCT: 40.1 % (ref 39.0–52.0)
Hemoglobin: 13.1 g/dL (ref 13.0–17.0)
Immature Granulocytes: 1 %
Lymphocytes Relative: 73 %
Lymphs Abs: 23.4 10*3/uL — ABNORMAL HIGH (ref 0.7–4.0)
MCH: 30.6 pg (ref 26.0–34.0)
MCHC: 32.7 g/dL (ref 30.0–36.0)
MCV: 93.7 fL (ref 80.0–100.0)
Monocytes Absolute: 1 10*3/uL (ref 0.1–1.0)
Monocytes Relative: 3 %
Neutro Abs: 7.1 10*3/uL (ref 1.7–7.7)
Neutrophils Relative %: 22 %
Platelet Count: 185 10*3/uL (ref 150–400)
RBC: 4.28 MIL/uL (ref 4.22–5.81)
RDW: 13.8 % (ref 11.5–15.5)
Smear Review: NORMAL
WBC Count: 32.1 10*3/uL — ABNORMAL HIGH (ref 4.0–10.5)
nRBC: 0 % (ref 0.0–0.2)

## 2023-06-11 LAB — LACTATE DEHYDROGENASE: LDH: 204 U/L — ABNORMAL HIGH (ref 98–192)

## 2023-06-11 NOTE — Progress Notes (Signed)
 [facility] [phone]   Fax:(336) [phone]  OFFICE PROGRESS NOTE  Benedetta Bradley, MD 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 Lorri Rota)    PRIOR THERAPY: None   CURRENT THERAPY: Observation   INTERVAL HISTORY: Tim Hardy 83 y.o. male returns to the clinic today for follow-up visit accompanied by his wife. Discussed the use of AI scribe software for clinical note transcription with the patient, who gave verbal consent to proceed.  History of Present Illness   Tim Hardy is an 83 year old male with chronic lymphocytic leukemia who presents for evaluation and repeat blood work. He is accompanied by his wife.  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 He is currently under observation for this condition. His white blood cell count has increased significantly from 15,100 in February 2025 to 32,100 at the current visit, indicating a rapid doubling time. Despite the increase in white blood cell count, he has no new symptoms such as weight loss, night sweats, or swollen glands in the neck, groin, or underarms. He also does not experience symptoms of anemia or low platelet count.  He has a history of prostate cancer, diagnosed in 2023, classified as stage IIIA. He is currently on observation for this condition as well.  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       MEDICAL HISTORY: Past Medical History:  Diagnosis Date   Asthma    Basal cell adenocarcinoma    BPH (benign prostatic hyperplasia)    Bronchitis    Cancer (HCC)     skin cancer removed in August   Colon polyps    Colon, diverticulosis    Diastolic dysfunction    Dilated aortic root (HCC)    ED (erectile dysfunction)    GERD (gastroesophageal reflux disease)    Heart murmur    Hiatal hernia    History of aortic valve replacement with bioprosthetic valve    Incarcerated umbilical hernia 10/20/2018   Mild CAD    Perennial allergic rhinitis    Shortness of breath    Thoracic ascending aortic aneurysm (HCC)     ALLERGIES:  is allergic to oxycontin  [oxycodone  hcl] and penicillins.  MEDICATIONS:  Current Outpatient Medications  Medication Sig Dispense Refill   acetaminophen  (TYLENOL ) 500 MG tablet Take 500 mg by mouth every 6 (six) hours as needed.     ALPRAZolam  (XANAX ) 0.5 MG tablet Take 0.25-0.5 mg by mouth 2 (two) times daily as needed.     aspirin  EC 81 MG tablet Take 81 mg by mouth every morning.      atorvastatin  (LIPITOR) 10 MG tablet Take 5 mg by mouth daily.     azithromycin  (ZITHROMAX ) 250 MG tablet Take 2 tabs by mouth 30-60 minutes prior to dental work 2 each 1   cetirizine (ZYRTEC) 10 MG tablet Take 10 mg by mouth as needed.     Cholecalciferol (VITAMIN D PO) Take 1 tablet by mouth every morning. Per patient taking 1,000 mcg     Cyanocobalamin (B-12 PO) Take 1 tablet by mouth every morning.  doxycycline  (VIBRA -TABS) 100 MG tablet Take 1 tablet (100 mg total) by mouth 2 (two) times daily. 20 tablet 0   FLOVENT  HFA 110 MCG/ACT inhaler Inhale 1 puff into the lungs 2 (two) times daily.     fluticasone  (FLONASE ) 50 MCG/ACT nasal spray Place 2 sprays into the nose daily as needed for allergies.     HYDROcodone -acetaminophen  (NORCO) 5-325 MG tablet Take 1-2 tablets by mouth every 6 (six) hours as needed for moderate pain or severe pain. 20 tablet 0   losartan  (COZAAR ) 50 MG tablet Take 50 mg by mouth 2 (two) times daily.  Pt takes 1 tablet in morning and 1 tablet at night.     Multiple Vitamin (MULTIVITAMIN WITH MINERALS) TABS Take 1  tablet by mouth every morning.      omeprazole (PRILOSEC) 10 MG capsule Take 1 capsule by mouth daily.     Tamsulosin  HCl (FLOMAX ) 0.4 MG CAPS Take 0.4 mg by mouth daily after supper.      No current facility-administered medications for this visit.    SURGICAL HISTORY:  Past Surgical History:  Procedure Laterality Date   AORTIC VALVE REPLACEMENT  11/03/2011   Procedure: AORTIC VALVE REPLACEMENT (AVR);  Surgeon: Zelphia Higashi, MD;  Location: [facility] OR;  Service: Open Heart Surgery;  Laterality: N/A;  Partial Sternotomy   APPENDECTOMY     CARDIAC CATHETERIZATION     COLONOSCOPY W/ POLYPECTOMY     COLONOSCOPY WITH PROPOFOL  N/A 07/11/2014   Procedure: COLONOSCOPY WITH PROPOFOL ;  Surgeon: Garrett Kallman, MD;  Location: [facility];  Service: Endoscopy;  Laterality: N/A;   ESOPHAGOGASTRODUODENOSCOPY (EGD) WITH PROPOFOL  N/A 07/11/2014   Procedure: ESOPHAGOGASTRODUODENOSCOPY (EGD) WITH PROPOFOL ;  Surgeon: Garrett Kallman, MD;  Location: [facility];  Service: Endoscopy;  Laterality: N/A;   FEMORAL HERNIA REPAIR     , x5   HERNIA REPAIR Bilateral    KNEE ARTHROSCOPY Bilateral    SHOULDER ARTHROSCOPY Right    STERNOTOMY  11/03/2011   Procedure: STERNOTOMY;  Surgeon: Zelphia Higashi, MD;  Location: [facility] OR;  Service: Open Heart Surgery;  Laterality: N/A;  Partial   TONSILLECTOMY     UMBILICAL HERNIA REPAIR N/A 10/20/2018   Procedure: OPEN REPAIR UMBILICAL HERNIA WITH  MESH;  Surgeon: Boyce Byes, MD;  Location: [facility];  Service: General;  Laterality: N/A;    REVIEW OF SYSTEMS:  A comprehensive review of systems was negative.   PHYSICAL EXAMINATION: General appearance: alert, cooperative, and no distress Head: Normocephalic, without obvious abnormality, atraumatic Neck: no adenopathy, no JVD, supple, symmetrical, trachea midline, and thyroid  not enlarged, symmetric, no tenderness/mass/nodules Lymph nodes: Cervical, supraclavicular, and axillary nodes normal. Resp:  clear to auscultation bilaterally Back: symmetric, no curvature. ROM normal. No CVA tenderness. Cardio: regular rate and rhythm, S1, S2 normal, no murmur, click, rub or gallop GI: soft, non-tender; bowel sounds normal; no masses,  no organomegaly Extremities: extremities normal, atraumatic, no cyanosis or edema  ECOG PERFORMANCE STATUS: 1 - Symptomatic but completely ambulatory  Blood pressure 120/64, pulse 64, temperature 97.7 F (36.5 C), temperature source Temporal, resp. rate 16, height 6\' 1"  (1.854 m), weight 235 lb 1.6 oz (106.6 kg), SpO2 100%.  LABORATORY DATA: Lab Results  Component Value Date   WBC 32.1 (H) 06/11/2023   HGB 13.1 06/11/2023   HCT 40.1 06/11/2023   MCV 93.7 06/11/2023   PLT 185 06/11/2023      Chemistry      Component Value Date/Time   NA 141 06/11/2023  1306   NA 143 06/04/2021 1409   K 4.2 06/11/2023 1306   CL 106 06/11/2023 1306   CO2 29 06/11/2023 1306   BUN 19 06/11/2023 1306   BUN 10 06/04/2021 1409   CREATININE 1.01 06/11/2023 1306   CREATININE 1.14 10/15/2015 0811      Component Value Date/Time   CALCIUM  9.3 06/11/2023 1306   ALKPHOS 52 06/11/2023 1306   AST 16 06/11/2023 1306   ALT 17 06/11/2023 1306   BILITOT 1.1 06/11/2023 1306       RADIOGRAPHIC STUDIES: No results found.  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 Lorri Rota.     Chronic lymphocytic leukemia Chronic lymphocytic leukemia with q11 deletion, a high-risk abnormality associated with rapid disease progression and shorter progression-free survival. White blood cell count increased from 15,100 in February to 32,100, indicating a rapid doubling time. No symptoms such as weight loss, night sweats, or lymphadenopathy. No anemia or thrombocytopenia present. Observation continues due to asymptomatic  status. Potential treatment options discussed, including oral drugs and infusions, with acalabrutinib as a possible oral treatment. Acalabrutinib is generally well-tolerated. Emphasis on monitoring white blood cell count trend before initiating treatment. - Repeat blood work in six weeks to monitor white blood cell count trend. - Consider acalabrutinib if white blood cell count continues to rise.  Prostate cancer Stage IIIA prostate cancer diagnosed in 2023. Currently under observation with no active treatment discussed during this visit.   The patient voices understanding of current disease status and treatment options and is in agreement with the current care plan.  All questions were answered. The patient knows to call the clinic with any problems, questions or concerns. We can certainly see the patient much sooner if necessary.  The total time spent in the appointment was 30 minutes including review of chart and various tests results, discussions about plan of care and coordination of care plan .   Disclaimer: This note was dictated with voice recognition software. Similar sounding words can inadvertently be transcribed and may not be corrected upon review.

## 2023-06-24 DIAGNOSIS — L821 Other seborrheic keratosis: Secondary | ICD-10-CM | POA: Diagnosis not present

## 2023-06-24 DIAGNOSIS — D225 Melanocytic nevi of trunk: Secondary | ICD-10-CM | POA: Diagnosis not present

## 2023-06-24 DIAGNOSIS — L728 Other follicular cysts of the skin and subcutaneous tissue: Secondary | ICD-10-CM | POA: Diagnosis not present

## 2023-06-24 DIAGNOSIS — L570 Actinic keratosis: Secondary | ICD-10-CM | POA: Diagnosis not present

## 2023-06-24 DIAGNOSIS — D170 Benign lipomatous neoplasm of skin and subcutaneous tissue of head, face and neck: Secondary | ICD-10-CM | POA: Diagnosis not present

## 2023-06-24 DIAGNOSIS — B353 Tinea pedis: Secondary | ICD-10-CM | POA: Diagnosis not present

## 2023-06-24 DIAGNOSIS — Z09 Encounter for follow-up examination after completed treatment for conditions other than malignant neoplasm: Secondary | ICD-10-CM | POA: Diagnosis not present

## 2023-06-24 DIAGNOSIS — L814 Other melanin hyperpigmentation: Secondary | ICD-10-CM | POA: Diagnosis not present

## 2023-06-30 ENCOUNTER — Encounter: Payer: Self-pay | Admitting: Cardiology

## 2023-06-30 ENCOUNTER — Ambulatory Visit: Payer: Medicare Other | Attending: Cardiology | Admitting: Cardiology

## 2023-06-30 VITALS — BP 124/70 | HR 79 | Ht 73.0 in | Wt 234.8 lb

## 2023-06-30 DIAGNOSIS — I10 Essential (primary) hypertension: Secondary | ICD-10-CM | POA: Diagnosis not present

## 2023-06-30 DIAGNOSIS — Z952 Presence of prosthetic heart valve: Secondary | ICD-10-CM

## 2023-06-30 DIAGNOSIS — C9110 Chronic lymphocytic leukemia of B-cell type not having achieved remission: Secondary | ICD-10-CM

## 2023-06-30 NOTE — Progress Notes (Signed)
 Cardiology Office Note:  .   Date:  06/30/2023  ID:  Tim Hardy, DOB 28-May-1940, MRN [id number] PCP: Benedetta Bradley, MD  [facility] Providers Cardiologist:  Dorothye Gathers, MD     History of Present Illness: .   Tim Hardy is a 83 y.o. male Discussed the use of AI scribe software for clinical note transcription with the patient, who gave verbal consent to proceed.  History of Present Illness Tim Hardy is an 83 year old male with aortic valve replacement who presents for follow-up of his aortic valve.  He underwent an aortic valve replacement in September 2013. A recent CT scan of his aorta showed normal diameter with no evidence of an aortic aneurysm. An echocardiogram in 2023 revealed a 25 mm valve with a mean gradient of 15 mmHg and a normal ejection fraction of 70%. He is concerned about the longevity of his valve, noting that it has been two years since it was last checked.  He has a history of chronic lymphocytic leukemia (CLL) which is being monitored by hematology. He has no symptoms from the CLL and has not required chemotherapy or medication. His white blood cell count was noted to be elevated at 30,000, but no treatment was initiated per patient  He has a history of prostate cancer treated with radiation and follows up with Dr. Kristeen Peto for this condition.  He has nonobstructive coronary artery disease with 30-40% stenosis in the LAD. His EKG shows normal sinus rhythm with an incomplete left bundle branch block.  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 He recently acquired a 16-seat [employer] wagon and is involved in various farm activities.     Studies Reviewed: .        Results LABS LDL: 49 (2024) Hb: 13.1 (2024) Cr: 1.0 (2024) WBC: 30 (2024)  RADIOLOGY CT scan of aorta: Normal diameter, no evidence of aortic aneurysm  DIAGNOSTIC Echocardiogram: Aortic dimension  25 mm, mean gradient 15 mmHg, EF 70% (2023) EKG: Normal sinus rhythm, incomplete left bundle branch block Risk Assessment/Calculations:            Physical Exam:   VS:  BP 124/70   Pulse 79   Ht 6\' 1"  (1.854 m)   Wt 234 lb 12.8 oz (106.5 kg)   SpO2 96%   BMI 30.98 kg/m    Wt Readings from Last 3 Encounters:  06/30/23 234 lb 12.8 oz (106.5 kg)  06/11/23 235 lb 1.6 oz (106.6 kg)  03/09/23 239 lb (108.4 kg)    GEN: Well nourished, well developed in no acute distress NECK: No JVD; No carotid bruits CARDIAC: RRR, soft systolic murmur, no rubs, no gallops RESPIRATORY:  Clear to auscultation without rales, wheezing or rhonchi  ABDOMEN: Soft, non-tender, non-distended EXTREMITIES:  No edema; No deformity   ASSESSMENT AND PLAN: .    Assessment and Plan Assessment & Plan Aortic valve replacement Aortic valve replacement performed in September 2013. Current valve status is well-managed, with potential for increased stenosis between 10-15 years post-replacement. Last echocardiogram in 2023 showed a 25 mm valve with a mean gradient of 15 mmHg and normal ejection fraction of 70%. Consider valve-in-valve procedure via catheterization if significant stenosis occurs. - Order echocardiogram to assess current status of aortic valve before October follow-up visit.  Nonobstructive Coronary Artery Disease (CAD) Nonobstructive CAD with 30-40% stenosis in the LAD. No acute symptoms. Managed conservatively with regular  monitoring.  Left bundle branch block EKG shows normal sinus rhythm with incomplete left bundle branch block. No acute cardiac symptoms.  Chronic Lymphocytic Leukemia (CLL) CLL monitored by hematology. No chemotherapy or medication required. Elevated white blood cell count but not at intervention level. Regular follow-ups every six weeks with hematology.  Prostate cancer Prostate cancer treated with radiation. Under surveillance with no active treatment required.          Dispo: 58-month  Signed, Dorothye Gathers, MD

## 2023-06-30 NOTE — Patient Instructions (Signed)
 Medication Instructions:  The current medical regimen is effective;  continue present plan and medications.  *If you need a refill on your cardiac medications before your next appointment, please call your pharmacy*   Testing/Procedures: Your physician has requested that you have an echocardiogram. Echocardiography is a painless test that uses sound waves to create images of your heart. It provides your doctor with information about the size and shape of your heart and how well your heart's chambers and valves are working. This procedure takes approximately one hour. There are no restrictions for this procedure. Please do NOT wear cologne, perfume, aftershave, or lotions (deodorant is allowed). Please arrive 15 minutes prior to your appointment time.  Please note: We ask at that you not bring children with you during ultrasound (echo/ vascular) testing. Due to room size and safety concerns, children are not allowed in the ultrasound rooms during exams. Our front office staff cannot provide observation of children in our lobby area while testing is being conducted. An adult accompanying a patient to their appointment will only be allowed in the ultrasound room at the discretion of the ultrasound technician under special circumstances. We apologize for any inconvenience.   Follow-Up: At [facility], you and your health needs are our priority.  As part of our continuing mission to provide you with exceptional heart care, our providers are all part of one team.  This team includes your primary Cardiologist (physician) and Advanced Practice Providers or APPs (Physician Assistants and Nurse Practitioners) who all work together to provide you with the care you need, when you need it.  Your next appointment:   6 month(s)  Provider:   Dorothye Gathers, MD    We recommend signing up for the patient portal called "MyChart".  Sign up information is provided on this After Visit Summary.  MyChart is  used to connect with patients for Virtual Visits (Telemedicine).  Patients are able to view lab/test results, encounter notes, upcoming appointments, etc.  Non-urgent messages can be sent to your provider as well.   To learn more about what you can do with MyChart, go to [email or web address].

## 2023-07-23 DIAGNOSIS — J449 Chronic obstructive pulmonary disease, unspecified: Secondary | ICD-10-CM | POA: Diagnosis not present

## 2023-07-23 DIAGNOSIS — F17210 Nicotine dependence, cigarettes, uncomplicated: Secondary | ICD-10-CM | POA: Diagnosis not present

## 2023-07-23 DIAGNOSIS — Z23 Encounter for immunization: Secondary | ICD-10-CM | POA: Diagnosis not present

## 2023-07-23 DIAGNOSIS — Z0000 Encounter for general adult medical examination without abnormal findings: Secondary | ICD-10-CM | POA: Diagnosis not present

## 2023-07-23 DIAGNOSIS — R7303 Prediabetes: Secondary | ICD-10-CM | POA: Diagnosis not present

## 2023-07-23 DIAGNOSIS — M1712 Unilateral primary osteoarthritis, left knee: Secondary | ICD-10-CM | POA: Diagnosis not present

## 2023-07-23 DIAGNOSIS — E7800 Pure hypercholesterolemia, unspecified: Secondary | ICD-10-CM | POA: Diagnosis not present

## 2023-07-23 DIAGNOSIS — Z79899 Other long term (current) drug therapy: Secondary | ICD-10-CM | POA: Diagnosis not present

## 2023-07-23 DIAGNOSIS — C9110 Chronic lymphocytic leukemia of B-cell type not having achieved remission: Secondary | ICD-10-CM | POA: Diagnosis not present

## 2023-07-23 DIAGNOSIS — I10 Essential (primary) hypertension: Secondary | ICD-10-CM | POA: Diagnosis not present

## 2023-07-27 ENCOUNTER — Inpatient Hospital Stay: Admitting: Internal Medicine

## 2023-07-27 ENCOUNTER — Inpatient Hospital Stay: Attending: Physician Assistant

## 2023-07-27 DIAGNOSIS — C61 Malignant neoplasm of prostate: Secondary | ICD-10-CM | POA: Insufficient documentation

## 2023-07-27 DIAGNOSIS — C9110 Chronic lymphocytic leukemia of B-cell type not having achieved remission: Secondary | ICD-10-CM

## 2023-07-27 LAB — CBC WITH DIFFERENTIAL (CANCER CENTER ONLY)
Abs Immature Granulocytes: 0.08 10*3/uL — ABNORMAL HIGH (ref 0.00–0.07)
Basophils Absolute: 0.1 10*3/uL (ref 0.0–0.1)
Basophils Relative: 0 %
Eosinophils Absolute: 0.2 10*3/uL (ref 0.0–0.5)
Eosinophils Relative: 1 %
HCT: 37.6 % — ABNORMAL LOW (ref 39.0–52.0)
Hemoglobin: 12.6 g/dL — ABNORMAL LOW (ref 13.0–17.0)
Immature Granulocytes: 0 %
Lymphocytes Relative: 71 %
Lymphs Abs: 18 10*3/uL — ABNORMAL HIGH (ref 0.7–4.0)
MCH: 31.6 pg (ref 26.0–34.0)
MCHC: 33.5 g/dL (ref 30.0–36.0)
MCV: 94.2 fL (ref 80.0–100.0)
Monocytes Absolute: 0.8 10*3/uL (ref 0.1–1.0)
Monocytes Relative: 3 %
Neutro Abs: 6.3 10*3/uL (ref 1.7–7.7)
Neutrophils Relative %: 25 %
Platelet Count: 172 10*3/uL (ref 150–400)
RBC: 3.99 MIL/uL — ABNORMAL LOW (ref 4.22–5.81)
RDW: 13.4 % (ref 11.5–15.5)
Smear Review: NORMAL
WBC Count: 25.5 10*3/uL — ABNORMAL HIGH (ref 4.0–10.5)
nRBC: 0.1 % (ref 0.0–0.2)

## 2023-07-27 LAB — CMP (CANCER CENTER ONLY)
ALT: 14 U/L (ref 0–44)
AST: 20 U/L (ref 15–41)
Albumin: 4 g/dL (ref 3.5–5.0)
Alkaline Phosphatase: 45 U/L (ref 38–126)
Anion gap: 5 (ref 5–15)
BUN: 17 mg/dL (ref 8–23)
CO2: 27 mmol/L (ref 22–32)
Calcium: 9.1 mg/dL (ref 8.9–10.3)
Chloride: 109 mmol/L (ref 98–111)
Creatinine: 0.96 mg/dL (ref 0.61–1.24)
GFR, Estimated: >60 mL/min (ref >60)
Glucose, Bld: 103 mg/dL — ABNORMAL HIGH (ref 70–99)
Potassium: 4 mmol/L (ref 3.5–5.1)
Sodium: 141 mmol/L (ref 135–145)
Total Bilirubin: 1 mg/dL (ref 0.0–1.2)
Total Protein: 6 g/dL — ABNORMAL LOW (ref 6.5–8.1)

## 2023-07-27 LAB — LACTATE DEHYDROGENASE: LDH: 188 U/L (ref 98–192)

## 2023-07-27 NOTE — Progress Notes (Signed)
 [facility] Telephone:(336) [phone]   Fax:(336) [phone]  OFFICE PROGRESS NOTE  [name] LABOR, MD 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 Patrcia)    PRIOR THERAPY: None   CURRENT THERAPY: Observation   INTERVAL HISTORY: Tim Hardy 83 y.o. male returns to the clinic today for follow-up visit accompanied by his significant other Tilton. Discussed the use of AI scribe software for clinical note transcription with the patient, who gave verbal consent to proceed.  History of Present Illness   Tim Hardy is an 83 year old male with chronic lymphocytic leukemia (CLL) who presents for evaluation and repeat blood work for close monitoring of his CLL.  He was diagnosed with CLL in August 2024, characterized by a Q11 deletion, a high-risk abnormality associated with rapid disease progression. 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 He takes precautions to avoid the sun during peak hours. No bleeding or bruising, noting only minor spots from work and interactions with a dog.  He has a history of prostate adenocarcinoma, diagnosed as stage 3A in 2023, and is currently under observation. There are no new symptoms or concerns related to his prostate cancer at this time.        MEDICAL HISTORY: Past Medical History:  Diagnosis Date   Asthma    Basal cell adenocarcinoma    BPH (benign prostatic hyperplasia)    Bronchitis    Cancer (HCC)    skin cancer removed in August   Colon polyps    Colon, diverticulosis    Diastolic dysfunction    Dilated aortic root (HCC)    ED (erectile dysfunction)    GERD (gastroesophageal reflux  disease)    Heart murmur    Hiatal hernia    History of aortic valve replacement with bioprosthetic valve    Incarcerated umbilical hernia 10/20/2018   Mild CAD    Perennial allergic rhinitis    Shortness of breath    Thoracic ascending aortic aneurysm (HCC)     ALLERGIES:  is allergic to oxycontin  [oxycodone  hcl] and penicillins.  MEDICATIONS:  Current Outpatient Medications  Medication Sig Dispense Refill   acetaminophen  (TYLENOL ) 500 MG tablet Take 500 mg by mouth every 6 (six) hours as needed.     ALPRAZolam  (XANAX ) 0.5 MG tablet Take 0.25-0.5 mg by mouth 2 (two) times daily as needed.     aspirin  EC 81 MG tablet Take 81 mg by mouth every morning.      atorvastatin  (LIPITOR) 10 MG tablet Take 5 mg by mouth daily.     azithromycin  (ZITHROMAX ) 250 MG tablet Take 2 tabs by mouth 30-60 minutes prior to dental work 2 each 1   cetirizine (ZYRTEC) 10 MG tablet Take 10 mg by mouth as needed.     Cholecalciferol (VITAMIN D PO) Take 1 tablet by mouth every morning. Per patient taking 1,000 mcg     Cyanocobalamin (B-12 PO) Take 1 tablet by mouth every morning.      doxycycline  (VIBRA -TABS) 100 MG tablet Take 1 tablet (100 mg total) by mouth 2 (two) times daily. 20 tablet 0   FLOVENT  HFA 110 MCG/ACT inhaler Inhale 1 puff into the lungs 2 (two)  times daily.     fluticasone  (FLONASE ) 50 MCG/ACT nasal spray Place 2 sprays into the nose daily as needed for allergies.     HYDROcodone -acetaminophen  (NORCO) 5-325 MG tablet Take 1-2 tablets by mouth every 6 (six) hours as needed for moderate pain or severe pain. 20 tablet 0   losartan  (COZAAR ) 50 MG tablet Take 50 mg by mouth 2 (two) times daily.  Pt takes 1 tablet in morning and 1 tablet at night.     Multiple Vitamin (MULTIVITAMIN WITH MINERALS) TABS Take 1 tablet by mouth every morning.      omeprazole (PRILOSEC) 10 MG capsule Take 1 capsule by mouth daily.     Tamsulosin  HCl (FLOMAX ) 0.4 MG CAPS Take 0.4 mg by mouth daily after supper.      No  current facility-administered medications for this visit.    SURGICAL HISTORY:  Past Surgical History:  Procedure Laterality Date   AORTIC VALVE REPLACEMENT  11/03/2011   Procedure: AORTIC VALVE REPLACEMENT (AVR);  Surgeon: Elspeth JAYSON Millers, MD;  Location: [facility] OR;  Service: Open Heart Surgery;  Laterality: N/A;  Partial Sternotomy   APPENDECTOMY     CARDIAC CATHETERIZATION     COLONOSCOPY W/ POLYPECTOMY     COLONOSCOPY WITH PROPOFOL  N/A 07/11/2014   Procedure: COLONOSCOPY WITH PROPOFOL ;  Surgeon: Gladis MARLA Louder, MD;  Location: [facility];  Service: Endoscopy;  Laterality: N/A;   ESOPHAGOGASTRODUODENOSCOPY (EGD) WITH PROPOFOL  N/A 07/11/2014   Procedure: ESOPHAGOGASTRODUODENOSCOPY (EGD) WITH PROPOFOL ;  Surgeon: Gladis MARLA Louder, MD;  Location: [facility];  Service: Endoscopy;  Laterality: N/A;   FEMORAL HERNIA REPAIR     , x5   HERNIA REPAIR Bilateral    KNEE ARTHROSCOPY Bilateral    SHOULDER ARTHROSCOPY Right    STERNOTOMY  11/03/2011   Procedure: STERNOTOMY;  Surgeon: Elspeth JAYSON Millers, MD;  Location: [facility] OR;  Service: Open Heart Surgery;  Laterality: N/A;  Partial   TONSILLECTOMY     UMBILICAL HERNIA REPAIR N/A 10/20/2018   Procedure: OPEN REPAIR UMBILICAL HERNIA WITH  MESH;  Surgeon: Gail Favorite, MD;  Location: [facility];  Service: General;  Laterality: N/A;    REVIEW OF SYSTEMS:  A comprehensive review of systems was negative.   PHYSICAL EXAMINATION: General appearance: alert, cooperative, and no distress Head: Normocephalic, without obvious abnormality, atraumatic Neck: no adenopathy, no JVD, supple, symmetrical, trachea midline, and thyroid  not enlarged, symmetric, no tenderness/mass/nodules Lymph nodes: Cervical, supraclavicular, and axillary nodes normal. Resp: clear to auscultation bilaterally Back: symmetric, no curvature. ROM normal. No CVA tenderness. Cardio: regular rate and rhythm, S1, S2 normal, no murmur, click, rub or gallop GI: soft,  non-tender; bowel sounds normal; no masses,  no organomegaly Extremities: extremities normal, atraumatic, no cyanosis or edema  ECOG PERFORMANCE STATUS: 1 - Symptomatic but completely ambulatory  There were no vitals taken for this visit.  LABORATORY DATA: Lab Results  Component Value Date   WBC 25.5 (H) 07/27/2023   HGB 12.6 (L) 07/27/2023   HCT 37.6 (L) 07/27/2023   MCV 94.2 07/27/2023   PLT 172 07/27/2023      Chemistry      Component Value Date/Time   NA 141 07/27/2023 1320   NA 143 06/04/2021 1409   K 4.0 07/27/2023 1320   CL 109 07/27/2023 1320   CO2 27 07/27/2023 1320   BUN 17 07/27/2023 1320   BUN 10 06/04/2021 1409   CREATININE 0.96 07/27/2023 1320   CREATININE 1.14 10/15/2015 0811      Component  Value Date/Time   CALCIUM  9.1 07/27/2023 1320   ALKPHOS 45 07/27/2023 1320   AST 20 07/27/2023 1320   ALT 14 07/27/2023 1320   BILITOT 1.0 07/27/2023 1320       RADIOGRAPHIC STUDIES: No results found.  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 Patrcia. Assessment and Plan    Chronic Lymphocytic Leukemia (CLL) with Q11 deletion CLL with Q11 deletion, a high-risk abnormality associated with rapid disease progression and shorter progression-free survival. Diagnosed in August 2024. Recent blood work shows a decrease in white blood cell count from 32,000 in May to 25,500, indicating stabilization. Hemoglobin slightly low at 12.6, but overall blood counts and chemistry are stable. No new symptoms such as bleeding or bruising reported. He remains active with no limitations in daily activities. - Schedule follow-up appointment in two months for repeat blood work and evaluation. - Advise him to avoid heavy sun exposure and stay active within safe limits.  Prostate adenocarcinoma Stage IIIA  prostate adenocarcinoma diagnosed in 2023. Currently under observation by Dr. Patrcia from radiation oncology. No new symptoms or concerns reported.   He was advised to call immediately if he has any concerning symptoms in the interval. The patient voices understanding of current disease status and treatment options and is in agreement with the current care plan.  All questions were answered. The patient knows to call the clinic with any problems, questions or concerns. We can certainly see the patient much sooner if necessary.  The total time spent in the appointment was 20 minutes including review of chart and various tests results, discussions about plan of care and coordination of care plan .   Disclaimer: This note was dictated with voice recognition software. Similar sounding words can inadvertently be transcribed and may not be corrected upon review.

## 2023-08-10 ENCOUNTER — Ambulatory Visit (HOSPITAL_COMMUNITY)
Admission: RE | Admit: 2023-08-10 | Discharge: 2023-08-10 | Disposition: A | Discharge status: Home / Self Care | Source: Ambulatory Visit | Attending: Cardiology | Admitting: Cardiology

## 2023-08-10 DIAGNOSIS — Z952 Presence of prosthetic heart valve: Secondary | ICD-10-CM | POA: Insufficient documentation

## 2023-08-10 DIAGNOSIS — I10 Essential (primary) hypertension: Secondary | ICD-10-CM | POA: Diagnosis not present

## 2023-08-10 LAB — ECHOCARDIOGRAM COMPLETE
AR max vel: 0.89 cm2
AV Area VTI: 1.1 cm2
AV Area mean vel: 0.75 cm2
AV Mean grad: 28 mmHg
AV Peak grad: 40.2 mmHg
Ao pk vel: 3.17 m/s
Area-P 1/2: 4.71 cm2
S' Lateral: 4 cm

## 2023-08-11 ENCOUNTER — Ambulatory Visit: Payer: Self-pay | Admitting: Cardiology

## 2023-08-28 ENCOUNTER — Other Ambulatory Visit: Payer: Self-pay | Admitting: *Deleted

## 2023-08-28 DIAGNOSIS — Z953 Presence of xenogenic heart valve: Secondary | ICD-10-CM

## 2023-09-21 ENCOUNTER — Inpatient Hospital Stay: Attending: Physician Assistant

## 2023-09-21 ENCOUNTER — Inpatient Hospital Stay: Admitting: Internal Medicine

## 2023-09-21 VITALS — BP 132/76 | HR 73 | Temp 97.9°F | Resp 16 | Ht 73.0 in | Wt 233.0 lb

## 2023-09-21 DIAGNOSIS — C9110 Chronic lymphocytic leukemia of B-cell type not having achieved remission: Secondary | ICD-10-CM | POA: Insufficient documentation

## 2023-09-21 DIAGNOSIS — Z9049 Acquired absence of other specified parts of digestive tract: Secondary | ICD-10-CM | POA: Diagnosis not present

## 2023-09-21 DIAGNOSIS — Z85828 Personal history of other malignant neoplasm of skin: Secondary | ICD-10-CM | POA: Diagnosis not present

## 2023-09-21 DIAGNOSIS — Z860100 Personal history of colon polyps, unspecified: Secondary | ICD-10-CM | POA: Insufficient documentation

## 2023-09-21 DIAGNOSIS — Z7982 Long term (current) use of aspirin: Secondary | ICD-10-CM | POA: Diagnosis not present

## 2023-09-21 DIAGNOSIS — Z9089 Acquired absence of other organs: Secondary | ICD-10-CM | POA: Diagnosis not present

## 2023-09-21 DIAGNOSIS — Z880 Allergy status to penicillin: Secondary | ICD-10-CM | POA: Insufficient documentation

## 2023-09-21 DIAGNOSIS — Z885 Allergy status to narcotic agent status: Secondary | ICD-10-CM | POA: Diagnosis not present

## 2023-09-21 DIAGNOSIS — C61 Malignant neoplasm of prostate: Secondary | ICD-10-CM | POA: Diagnosis present

## 2023-09-21 DIAGNOSIS — Z79899 Other long term (current) drug therapy: Secondary | ICD-10-CM | POA: Diagnosis not present

## 2023-09-21 DIAGNOSIS — N400 Enlarged prostate without lower urinary tract symptoms: Secondary | ICD-10-CM | POA: Insufficient documentation

## 2023-09-21 LAB — CBC WITH DIFFERENTIAL (CANCER CENTER ONLY)
Abs Immature Granulocytes: 0.07 K/uL (ref 0.00–0.07)
Basophils Absolute: 0.1 K/uL (ref 0.0–0.1)
Basophils Relative: 0 %
Eosinophils Absolute: 0.3 K/uL (ref 0.0–0.5)
Eosinophils Relative: 1 %
HCT: 37.5 % — ABNORMAL LOW (ref 39.0–52.0)
Hemoglobin: 12.8 g/dL — ABNORMAL LOW (ref 13.0–17.0)
Immature Granulocytes: 0 %
Lymphocytes Relative: 66 %
Lymphs Abs: 15.4 K/uL — ABNORMAL HIGH (ref 0.7–4.0)
MCH: 31.4 pg (ref 26.0–34.0)
MCHC: 34.1 g/dL (ref 30.0–36.0)
MCV: 92.1 fL (ref 80.0–100.0)
Monocytes Absolute: 1.8 K/uL — ABNORMAL HIGH (ref 0.1–1.0)
Monocytes Relative: 7 %
Neutro Abs: 6.3 K/uL (ref 1.7–7.7)
Neutrophils Relative %: 26 %
Platelet Count: 174 K/uL (ref 150–400)
RBC: 4.07 MIL/uL — ABNORMAL LOW (ref 4.22–5.81)
RDW: 13.1 % (ref 11.5–15.5)
Smear Review: NORMAL
WBC Count: 23.9 K/uL — ABNORMAL HIGH (ref 4.0–10.5)
nRBC: 0 % (ref 0.0–0.2)

## 2023-09-21 LAB — CMP (CANCER CENTER ONLY)
ALT: 16 U/L (ref 0–44)
AST: 21 U/L (ref 15–41)
Albumin: 4 g/dL (ref 3.5–5.0)
Alkaline Phosphatase: 46 U/L (ref 38–126)
Anion gap: 6 (ref 5–15)
BUN: 14 mg/dL (ref 8–23)
CO2: 25 mmol/L (ref 22–32)
Calcium: 8.8 mg/dL — ABNORMAL LOW (ref 8.9–10.3)
Chloride: 109 mmol/L (ref 98–111)
Creatinine: 0.95 mg/dL (ref 0.61–1.24)
GFR, Estimated: >60 mL/min (ref >60)
Glucose, Bld: 108 mg/dL — ABNORMAL HIGH (ref 70–99)
Potassium: 3.7 mmol/L (ref 3.5–5.1)
Sodium: 140 mmol/L (ref 135–145)
Total Bilirubin: 1.3 mg/dL — ABNORMAL HIGH (ref 0.0–1.2)
Total Protein: 5.8 g/dL — ABNORMAL LOW (ref 6.5–8.1)

## 2023-09-21 LAB — LACTATE DEHYDROGENASE: LDH: 181 U/L (ref 98–192)

## 2023-09-21 NOTE — Progress Notes (Signed)
 [name] Telephone:(336) [phone]   Fax:([phone]  OFFICE PROGRESS NOTE  [name] LABOR, MD 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 Patrcia)    PRIOR THERAPY: None   CURRENT THERAPY: Observation   INTERVAL HISTORY: Tim Hardy 83 y.o. male returns to the clinic today for follow-up visit accompanied by his significant other Tim Hardy. Discussed the use of AI scribe software for clinical note transcription with the patient, who gave verbal consent to proceed.  History of Present Illness Tim Hardy is an 83 year old male with chronic lymphocytic leukemia and prostate adenocarcinoma who presents for evaluation and repeat blood work.  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 Recent white blood cell counts were 32.1 in May, 25.5 in June, and 23.9 most recently. No current symptoms such as night sweats, weight loss, or swollen glands.  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 There are no current symptoms related to this condition.  No breathing issues, nausea, vomiting, diarrhea, or other concerning symptoms.  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     MEDICAL HISTORY: Past Medical History:  Diagnosis Date   Asthma    Basal cell adenocarcinoma    BPH (benign prostatic hyperplasia)    Bronchitis    Cancer (HCC)    skin cancer removed in August   Colon polyps    Colon, diverticulosis    Diastolic dysfunction    Dilated aortic root (HCC)    ED (erectile dysfunction)    GERD (gastroesophageal reflux disease)    Heart murmur     Hiatal hernia    History of aortic valve replacement with bioprosthetic valve    Incarcerated umbilical hernia 10/20/2018   Mild CAD    Perennial allergic rhinitis    Shortness of breath    Thoracic ascending aortic aneurysm (HCC)     ALLERGIES:  is allergic to oxycontin  [oxycodone  hcl] and penicillins.  MEDICATIONS:  Current Outpatient Medications  Medication Sig Dispense Refill   acetaminophen  (TYLENOL ) 500 MG tablet Take 500 mg by mouth every 6 (six) hours as needed.     ALPRAZolam  (XANAX ) 0.5 MG tablet Take 0.25-0.5 mg by mouth 2 (two) times daily as needed.     aspirin  EC 81 MG tablet Take 81 mg by mouth every morning.      atorvastatin  (LIPITOR) 10 MG tablet Take 5 mg by mouth daily.     azithromycin  (ZITHROMAX ) 250 MG tablet Take 2 tabs by mouth 30-60 minutes prior to dental work 2 each 1   cetirizine (ZYRTEC) 10 MG tablet Take 10 mg by mouth as needed.     Cholecalciferol (VITAMIN D PO) Take 1 tablet by mouth every morning. Per patient taking 1,000 mcg     Cyanocobalamin (B-12 PO) Take 1 tablet by mouth every morning.      doxycycline  (VIBRA -TABS) 100 MG tablet Take 1 tablet (100 mg total) by mouth 2 (two) times daily. 20 tablet 0   FLOVENT  HFA 110 MCG/ACT inhaler Inhale 1 puff into the lungs 2 (two) times daily.     fluticasone  (  FLONASE ) 50 MCG/ACT nasal spray Place 2 sprays into the nose daily as needed for allergies.     HYDROcodone -acetaminophen  (NORCO) 5-325 MG tablet Take 1-2 tablets by mouth every 6 (six) hours as needed for moderate pain or severe pain. 20 tablet 0   losartan  (COZAAR ) 50 MG tablet Take 50 mg by mouth 2 (two) times daily.  Pt takes 1 tablet in morning and 1 tablet at night.     Multiple Vitamin (MULTIVITAMIN WITH MINERALS) TABS Take 1 tablet by mouth every morning.      omeprazole (PRILOSEC) 10 MG capsule Take 1 capsule by mouth daily.     Tamsulosin  HCl (FLOMAX ) 0.4 MG CAPS Take 0.4 mg by mouth daily after supper.      No current facility-administered  medications for this visit.    SURGICAL HISTORY:  Past Surgical History:  Procedure Laterality Date   AORTIC VALVE REPLACEMENT  11/03/2011   Procedure: AORTIC VALVE REPLACEMENT (AVR);  Surgeon: Elspeth JAYSON Millers, MD;  Location: [facility] OR;  Service: Open Heart Surgery;  Laterality: N/A;  Partial Sternotomy   APPENDECTOMY     CARDIAC CATHETERIZATION     COLONOSCOPY W/ POLYPECTOMY     COLONOSCOPY WITH PROPOFOL  N/A 07/11/2014   Procedure: COLONOSCOPY WITH PROPOFOL ;  Surgeon: Gladis MARLA Louder, MD;  Location: [facility];  Service: Endoscopy;  Laterality: N/A;   ESOPHAGOGASTRODUODENOSCOPY (EGD) WITH PROPOFOL  N/A 07/11/2014   Procedure: ESOPHAGOGASTRODUODENOSCOPY (EGD) WITH PROPOFOL ;  Surgeon: Gladis MARLA Louder, MD;  Location: [facility];  Service: Endoscopy;  Laterality: N/A;   FEMORAL HERNIA REPAIR     , x5   HERNIA REPAIR Bilateral    KNEE ARTHROSCOPY Bilateral    SHOULDER ARTHROSCOPY Right    STERNOTOMY  11/03/2011   Procedure: STERNOTOMY;  Surgeon: Elspeth JAYSON Millers, MD;  Location: [facility] OR;  Service: Open Heart Surgery;  Laterality: N/A;  Partial   TONSILLECTOMY     UMBILICAL HERNIA REPAIR N/A 10/20/2018   Procedure: OPEN REPAIR UMBILICAL HERNIA WITH  MESH;  Surgeon: Gail Favorite, MD;  Location: [facility];  Service: General;  Laterality: N/A;    REVIEW OF SYSTEMS:  A comprehensive review of systems was negative.   PHYSICAL EXAMINATION: General appearance: alert, cooperative, and no distress Head: Normocephalic, without obvious abnormality, atraumatic Neck: no adenopathy, no JVD, supple, symmetrical, trachea midline, and thyroid  not enlarged, symmetric, no tenderness/mass/nodules Lymph nodes: Cervical, supraclavicular, and axillary nodes normal. Resp: clear to auscultation bilaterally Back: symmetric, no curvature. ROM normal. No CVA tenderness. Cardio: regular rate and rhythm, S1, S2 normal, no murmur, click, rub or gallop GI: soft, non-tender; bowel sounds normal; no  masses,  no organomegaly Extremities: extremities normal, atraumatic, no cyanosis or edema  ECOG PERFORMANCE STATUS: 1 - Symptomatic but completely ambulatory  Blood pressure 132/76, pulse 73, temperature 97.9 F (36.6 C), temperature source Temporal, resp. rate 16, height 6' 1 (1.854 m), weight 233 lb (105.7 kg), SpO2 98%.  LABORATORY DATA: Lab Results  Component Value Date   WBC 23.9 (H) 09/21/2023   HGB 12.8 (L) 09/21/2023   HCT 37.5 (L) 09/21/2023   MCV 92.1 09/21/2023   PLT 174 09/21/2023      Chemistry      Component Value Date/Time   NA 141 07/27/2023 1320   NA 143 06/04/2021 1409   K 4.0 07/27/2023 1320   CL 109 07/27/2023 1320   CO2 27 07/27/2023 1320   BUN 17 07/27/2023 1320   BUN 10 06/04/2021 1409   CREATININE 0.96 07/27/2023  1320   CREATININE 1.14 10/15/2015 0811      Component Value Date/Time   CALCIUM  9.1 07/27/2023 1320   ALKPHOS 45 07/27/2023 1320   AST 20 07/27/2023 1320   ALT 14 07/27/2023 1320   BILITOT 1.0 07/27/2023 1320       RADIOGRAPHIC STUDIES: No results found.  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 Patrcia. Assessment and Plan Assessment & Plan Chronic lymphocytic leukemia with 11q deletion Chronic lymphocytic leukemia (CLL) with 11q deletion, a high-risk abnormality associated with rapid disease progression and shorter progression-free survival. He is currently asymptomatic with no night sweats, weight loss, or other systemic symptoms. His white blood cell count has decreased from 32.1 in May to 23.9 currently, indicating a positive trend. The condition appears well-managed at this time. - Schedule follow-up appointment in 6 months. - Instruct to call if any new symptoms or concerns arise.  The patient voices understanding of current disease status  and treatment options and is in agreement with the current care plan.  All questions were answered. The patient knows to call the clinic with any problems, questions or concerns. We can certainly see the patient much sooner if necessary.  The total time spent in the appointment was 20 minutes including review of chart and various tests results, discussions about plan of care and coordination of care plan .   Disclaimer: This note was dictated with voice recognition software. Similar sounding words can inadvertently be transcribed and may not be corrected upon review.

## 2023-10-15 DIAGNOSIS — M170 Bilateral primary osteoarthritis of knee: Secondary | ICD-10-CM | POA: Diagnosis not present

## 2023-10-15 DIAGNOSIS — M7062 Trochanteric bursitis, left hip: Secondary | ICD-10-CM | POA: Diagnosis not present

## 2023-10-15 DIAGNOSIS — M7071 Other bursitis of hip, right hip: Secondary | ICD-10-CM | POA: Diagnosis not present

## 2023-10-20 DIAGNOSIS — H43812 Vitreous degeneration, left eye: Secondary | ICD-10-CM | POA: Diagnosis not present

## 2023-12-02 DIAGNOSIS — W548XXA Other contact with dog, initial encounter: Secondary | ICD-10-CM | POA: Diagnosis not present

## 2023-12-02 DIAGNOSIS — L03113 Cellulitis of right upper limb: Secondary | ICD-10-CM | POA: Diagnosis not present

## 2023-12-07 ENCOUNTER — Other Ambulatory Visit: Payer: Self-pay | Admitting: *Deleted

## 2023-12-07 ENCOUNTER — Telehealth: Payer: Self-pay | Admitting: *Deleted

## 2023-12-07 DIAGNOSIS — C9110 Chronic lymphocytic leukemia of B-cell type not having achieved remission: Secondary | ICD-10-CM

## 2023-12-07 DIAGNOSIS — C61 Malignant neoplasm of prostate: Secondary | ICD-10-CM

## 2023-12-07 NOTE — Telephone Encounter (Signed)
 Spoke with pt. and informed him MRI was ordered and scheduled for Thursday, 12/10/23 1500 at [facility].

## 2023-12-07 NOTE — Telephone Encounter (Signed)
 Received a call requesting an MRI. Called back and spoke with pt. states he went for an eye exam at [facility] and Dr. [place] instructed pt. to get an MRI for possible tumor on brain or optic nerve. I told him I would request records and inform Dr. Sherrod.  I called [facility] spoke with Darice and requested exam notes. Darice also states that [facility] was booked out 1 year for eye referral and that pt. did not want to travel to [facility] for Neuro/opthalmology appt. Awaiting fax notes from [facility].

## 2023-12-10 ENCOUNTER — Ambulatory Visit (HOSPITAL_COMMUNITY)
Admission: RE | Admit: 2023-12-10 | Discharge: 2023-12-10 | Disposition: A | Discharge status: Home / Self Care | Source: Ambulatory Visit | Attending: Internal Medicine | Admitting: Internal Medicine

## 2023-12-10 DIAGNOSIS — C61 Malignant neoplasm of prostate: Secondary | ICD-10-CM | POA: Diagnosis present

## 2023-12-10 DIAGNOSIS — C9110 Chronic lymphocytic leukemia of B-cell type not having achieved remission: Secondary | ICD-10-CM | POA: Diagnosis present

## 2023-12-10 MED ORDER — GADOBUTROL 1 MMOL/ML IV SOLN
10.0000 mL | Freq: Once | INTRAVENOUS | Status: AC | PRN
  Administered 2023-12-10: 10 mL via INTRAVENOUS

## 2023-12-14 ENCOUNTER — Telehealth: Payer: Self-pay | Admitting: *Deleted

## 2023-12-14 NOTE — Telephone Encounter (Signed)
 Spoke with Darice from [facility] requesting MRI results. Scan faxed 12/14/23.

## 2023-12-15 ENCOUNTER — Telehealth: Payer: Self-pay | Admitting: *Deleted

## 2023-12-15 NOTE — Telephone Encounter (Signed)
 Spoke with pt.and per Dr. Sherrod Sherrod, Sherrod, MD sent to [facility] Nothing concerning on the MRI.  Follow-up as planned. Informed pt. next scheduled visit was 03/21/24 at 1330 and to call with any concerns/issues.

## 2023-12-23 ENCOUNTER — Other Ambulatory Visit (HOSPITAL_COMMUNITY)
Admission: RE | Admit: 2023-12-23 | Discharge: 2023-12-23 | Disposition: A | Discharge status: Home / Self Care | Source: Ambulatory Visit | Attending: Ophthalmology | Admitting: Ophthalmology

## 2023-12-23 ENCOUNTER — Ambulatory Visit (HOSPITAL_COMMUNITY)
Admission: RE | Admit: 2023-12-23 | Discharge: 2023-12-23 | Disposition: A | Discharge status: Home / Self Care | Source: Ambulatory Visit | Attending: Ophthalmology | Admitting: Ophthalmology

## 2023-12-23 ENCOUNTER — Other Ambulatory Visit (HOSPITAL_COMMUNITY): Payer: Self-pay | Admitting: Ophthalmology

## 2023-12-23 ENCOUNTER — Telehealth: Payer: Self-pay | Admitting: *Deleted

## 2023-12-23 ENCOUNTER — Other Ambulatory Visit: Payer: Self-pay | Admitting: *Deleted

## 2023-12-23 DIAGNOSIS — R7612 Nonspecific reaction to cell mediated immunity measurement of gamma interferon antigen response without active tuberculosis: Secondary | ICD-10-CM

## 2023-12-23 DIAGNOSIS — G130 Paraneoplastic neuromyopathy and neuropathy: Secondary | ICD-10-CM | POA: Diagnosis present

## 2023-12-23 DIAGNOSIS — H53459 Other localized visual field defect, unspecified eye: Secondary | ICD-10-CM

## 2023-12-23 LAB — CBC WITH DIFFERENTIAL/PLATELET
Abs Immature Granulocytes: 0.14 K/uL — ABNORMAL HIGH (ref 0.00–0.07)
Basophils Absolute: 0.1 K/uL (ref 0.0–0.1)
Basophils Relative: 0 %
Eosinophils Absolute: 0.3 K/uL (ref 0.0–0.5)
Eosinophils Relative: 1 %
HCT: 40.9 % (ref 39.0–52.0)
Hemoglobin: 13.4 g/dL (ref 13.0–17.0)
Immature Granulocytes: 0 %
Lymphocytes Relative: 79 %
Lymphs Abs: 30 K/uL — ABNORMAL HIGH (ref 0.7–4.0)
MCH: 31.8 pg (ref 26.0–34.0)
MCHC: 32.8 g/dL (ref 30.0–36.0)
MCV: 97.1 fL (ref 80.0–100.0)
Monocytes Absolute: 1.2 K/uL — ABNORMAL HIGH (ref 0.1–1.0)
Monocytes Relative: 3 %
Neutro Abs: 6.6 K/uL (ref 1.7–7.7)
Neutrophils Relative %: 17 %
Platelets: 223 K/uL (ref 150–400)
RBC: 4.21 MIL/uL — ABNORMAL LOW (ref 4.22–5.81)
RDW: 14 % (ref 11.5–15.5)
Smear Review: NORMAL
WBC: 38.4 K/uL — ABNORMAL HIGH (ref 4.0–10.5)
nRBC: 0 % (ref 0.0–0.2)

## 2023-12-23 LAB — COMPREHENSIVE METABOLIC PANEL WITH GFR
ALT: 18 U/L (ref 0–44)
AST: 25 U/L (ref 15–41)
Albumin: 4.1 g/dL (ref 3.5–5.0)
Alkaline Phosphatase: 70 U/L (ref 38–126)
Anion gap: 12 (ref 5–15)
BUN: 17 mg/dL (ref 8–23)
CO2: 24 mmol/L (ref 22–32)
Calcium: 9.8 mg/dL (ref 8.9–10.3)
Chloride: 105 mmol/L (ref 98–111)
Creatinine, Ser: 0.96 mg/dL (ref 0.61–1.24)
GFR, Estimated: >60 mL/min (ref >60)
Glucose, Bld: 145 mg/dL — ABNORMAL HIGH (ref 70–99)
Potassium: 3.8 mmol/L (ref 3.5–5.1)
Sodium: 140 mmol/L (ref 135–145)
Total Bilirubin: 1 mg/dL (ref 0.0–1.2)
Total Protein: 6.3 g/dL — ABNORMAL LOW (ref 6.5–8.1)

## 2023-12-23 LAB — SEDIMENTATION RATE: Sed Rate: 4 mm/h (ref 0–16)

## 2023-12-23 NOTE — Telephone Encounter (Signed)
 Patient was seen by Dr Arley Ruder Ophthalmologist for sudden loss of peripheral vision.  He ordered several labs and they will be drawn at [facility].  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  Scheduled appt to see Dr Sherrod and asked patient to also have labs resulted to Dr Sherrod as well.

## 2023-12-24 LAB — RPR: RPR Ser Ql: NONREACTIVE

## 2023-12-25 LAB — QUANTIFERON-TB GOLD PLUS (RQFGPL)
QuantiFERON Mitogen Value: >10 [IU]/mL
QuantiFERON Nil Value: 0.03 [IU]/mL
QuantiFERON TB1 Ag Value: 0.03 [IU]/mL
QuantiFERON TB2 Ag Value: 0.02 [IU]/mL

## 2023-12-25 LAB — LYME DISEASE SEROLOGY W/REFLEX: Lyme Total Antibody EIA: NEGATIVE

## 2023-12-25 LAB — QUANTIFERON-TB GOLD PLUS: QuantiFERON-TB Gold Plus: NEGATIVE

## 2023-12-25 LAB — T.PALLIDUM AB, TOTAL: T Pallidum Abs: NONREACTIVE

## 2023-12-26 LAB — ANGIOTENSIN CONVERTING ENZYME: Angiotensin-Converting Enzyme: 40 U/L (ref 14–82)

## 2023-12-27 LAB — VITAMIN A: Vitamin A (Retinoic Acid): 53.3 ug/dL (ref 22.0–69.5)

## 2023-12-30 ENCOUNTER — Inpatient Hospital Stay: Attending: Internal Medicine | Admitting: Internal Medicine

## 2023-12-30 ENCOUNTER — Inpatient Hospital Stay

## 2023-12-30 VITALS — BP 126/71 | HR 84 | Temp 96.8°F | Resp 17 | Ht 73.0 in | Wt 232.0 lb

## 2023-12-30 DIAGNOSIS — C9110 Chronic lymphocytic leukemia of B-cell type not having achieved remission: Secondary | ICD-10-CM

## 2023-12-30 LAB — CBC WITH DIFFERENTIAL (CANCER CENTER ONLY)
Abs Immature Granulocytes: 0.15 K/uL — ABNORMAL HIGH (ref 0.00–0.07)
Basophils Absolute: 0.1 K/uL (ref 0.0–0.1)
Basophils Relative: 0 %
Eosinophils Absolute: 0.3 K/uL (ref 0.0–0.5)
Eosinophils Relative: 1 %
HCT: 40.2 % (ref 39.0–52.0)
Hemoglobin: 13.4 g/dL (ref 13.0–17.0)
Immature Granulocytes: 0 %
Lymphocytes Relative: 77 %
Lymphs Abs: 27.3 K/uL — ABNORMAL HIGH (ref 0.7–4.0)
MCH: 31.2 pg (ref 26.0–34.0)
MCHC: 33.3 g/dL (ref 30.0–36.0)
MCV: 93.7 fL (ref 80.0–100.0)
Monocytes Absolute: 1 K/uL (ref 0.1–1.0)
Monocytes Relative: 3 %
Neutro Abs: 6.9 K/uL (ref 1.7–7.7)
Neutrophils Relative %: 19 %
Platelet Count: 213 K/uL (ref 150–400)
RBC: 4.29 MIL/uL (ref 4.22–5.81)
RDW: 13.7 % (ref 11.5–15.5)
Smear Review: NORMAL
WBC Count: 35.8 K/uL — ABNORMAL HIGH (ref 4.0–10.5)
nRBC: 0 % (ref 0.0–0.2)

## 2023-12-30 NOTE — Progress Notes (Signed)
 [facility] Telephone:(336) [phone]   Fax:(336) [phone]  OFFICE PROGRESS NOTE  [name] LABOR, MD 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 Patrcia)    PRIOR THERAPY: None   CURRENT THERAPY: Observation   INTERVAL HISTORY: Tim Hardy 83 y.o. male returns to the clinic today for follow-up visit accompanied by his significant other Tim Hardy. Discussed the use of AI scribe software for clinical note transcription with the patient, who gave verbal consent to proceed.  History of Present Illness Tim Hardy is an 83 year old male with chronic lymphocytic leukemia who presents for evaluation and repeat blood work. He was referred by Dr. Elner for evaluation of his peripheral vision issues.  He has a history of chronic lymphocytic leukemia (CLL) diagnosed in August 2024 with a Q11 deletion, a high-risk abnormality associated with rapid disease progression. His white blood cell count has increased to approximately 35,000, since the last measurement. He is awaiting results from paraneoplastic syndrome testing.  He has issues with his peripheral vision, which was noted by his eye doctor. He maintains a good level of energy and physical activity for his age.  His past medical history includes prostate cancer. He maintains an active lifestyle, engaging in activities like climbing and fishing.    MEDICAL HISTORY: Past Medical History:  Diagnosis Date   Asthma    Basal cell adenocarcinoma    BPH (benign prostatic hyperplasia)    Bronchitis    Cancer (HCC)    skin cancer removed in August   Colon polyps    Colon, diverticulosis    Diastolic dysfunction    Dilated aortic root    ED (erectile  dysfunction)    GERD (gastroesophageal reflux disease)    Heart murmur    Hiatal hernia    History of aortic valve replacement with bioprosthetic valve    Incarcerated umbilical hernia 10/20/2018   Mild CAD    Perennial allergic rhinitis    Shortness of breath    Thoracic ascending aortic aneurysm     ALLERGIES:  is allergic to oxycontin  [oxycodone  hcl] and penicillins.  MEDICATIONS:  Current Outpatient Medications  Medication Sig Dispense Refill   acetaminophen  (TYLENOL ) 500 MG tablet Take 500 mg by mouth every 6 (six) hours as needed.     ALPRAZolam  (XANAX ) 0.5 MG tablet Take 0.25-0.5 mg by mouth 2 (two) times daily as needed.     aspirin  EC 81 MG tablet Take 81 mg by mouth every morning.      atorvastatin  (LIPITOR) 10 MG tablet Take 5 mg by mouth daily.     azithromycin  (ZITHROMAX ) 250 MG tablet Take 2 tabs by mouth 30-60 minutes prior to dental work 2 each 1   cetirizine (ZYRTEC) 10 MG tablet Take 10 mg by mouth as needed.     Cholecalciferol (VITAMIN D PO) Take 1 tablet by mouth every morning. Per patient taking 1,000 mcg     Cyanocobalamin (B-12 PO) Take 1 tablet by mouth every morning.      doxycycline  (VIBRA -TABS) 100 MG tablet Take 1 tablet (100 mg total) by mouth 2 (two) times daily. 20 tablet 0   FLOVENT  HFA 110 MCG/ACT inhaler Inhale 1 puff into the lungs 2 (two) times daily.  fluticasone  (FLONASE ) 50 MCG/ACT nasal spray Place 2 sprays into the nose daily as needed for allergies.     HYDROcodone -acetaminophen  (NORCO) 5-325 MG tablet Take 1-2 tablets by mouth every 6 (six) hours as needed for moderate pain or severe pain. 20 tablet 0   losartan  (COZAAR ) 50 MG tablet Take 50 mg by mouth 2 (two) times daily.  Pt takes 1 tablet in morning and 1 tablet at night.     Multiple Vitamin (MULTIVITAMIN WITH MINERALS) TABS Take 1 tablet by mouth every morning.      omeprazole (PRILOSEC) 10 MG capsule Take 1 capsule by mouth daily.     Tamsulosin  HCl (FLOMAX ) 0.4 MG CAPS Take 0.4 mg  by mouth daily after supper.      No current facility-administered medications for this visit.    SURGICAL HISTORY:  Past Surgical History:  Procedure Laterality Date   AORTIC VALVE REPLACEMENT  11/03/2011   Procedure: AORTIC VALVE REPLACEMENT (AVR);  Surgeon: Elspeth JAYSON Millers, MD;  Location: [facility] OR;  Service: Open Heart Surgery;  Laterality: N/A;  Partial Sternotomy   APPENDECTOMY     CARDIAC CATHETERIZATION     COLONOSCOPY W/ POLYPECTOMY     COLONOSCOPY WITH PROPOFOL  N/A 07/11/2014   Procedure: COLONOSCOPY WITH PROPOFOL ;  Surgeon: Gladis MARLA Louder, MD;  Location: [facility];  Service: Endoscopy;  Laterality: N/A;   ESOPHAGOGASTRODUODENOSCOPY (EGD) WITH PROPOFOL  N/A 07/11/2014   Procedure: ESOPHAGOGASTRODUODENOSCOPY (EGD) WITH PROPOFOL ;  Surgeon: Gladis MARLA Louder, MD;  Location: [facility];  Service: Endoscopy;  Laterality: N/A;   FEMORAL HERNIA REPAIR     , x5   HERNIA REPAIR Bilateral    KNEE ARTHROSCOPY Bilateral    SHOULDER ARTHROSCOPY Right    STERNOTOMY  11/03/2011   Procedure: STERNOTOMY;  Surgeon: Elspeth JAYSON Millers, MD;  Location: [facility] OR;  Service: Open Heart Surgery;  Laterality: N/A;  Partial   TONSILLECTOMY     UMBILICAL HERNIA REPAIR N/A 10/20/2018   Procedure: OPEN REPAIR UMBILICAL HERNIA WITH  MESH;  Surgeon: Gail Favorite, MD;  Location:  SURGERY CENTER;  Service: General;  Laterality: N/A;    REVIEW OF SYSTEMS:  Constitutional: positive for fatigue Eyes: positive for visual disturbance Ears, nose, mouth, throat, and face: negative Respiratory: negative Cardiovascular: negative Gastrointestinal: negative Genitourinary:negative Integument/breast: negative Hematologic/lymphatic: negative Musculoskeletal:negative Neurological: negative Behavioral/Psych: negative Endocrine: negative Allergic/Immunologic: negative   PHYSICAL EXAMINATION: General appearance: alert, cooperative, fatigued, and no distress Head: Normocephalic, without obvious  abnormality, atraumatic Neck: no adenopathy, no JVD, supple, symmetrical, trachea midline, and thyroid  not enlarged, symmetric, no tenderness/mass/nodules Lymph nodes: Cervical, supraclavicular, and axillary nodes normal. Resp: clear to auscultation bilaterally Back: symmetric, no curvature. ROM normal. No CVA tenderness. Cardio: regular rate and rhythm, S1, S2 normal, no murmur, click, rub or gallop GI: soft, non-tender; bowel sounds normal; no masses,  no organomegaly Extremities: extremities normal, atraumatic, no cyanosis or edema Neurologic: Alert and oriented X 3, normal strength and tone. Normal symmetric reflexes. Normal coordination and gait  ECOG PERFORMANCE STATUS: 1 - Symptomatic but completely ambulatory  Blood pressure 126/71, pulse 84, temperature (!) 96.8 F (36 C), temperature source Temporal, resp. rate 17, height 6' 1 (1.854 m), weight 232 lb (105.2 kg), SpO2 99%.  LABORATORY DATA: Lab Results  Component Value Date   WBC 38.4 (H) 12/23/2023   HGB 13.4 12/23/2023   HCT 40.9 12/23/2023   MCV 97.1 12/23/2023   PLT 223 12/23/2023      Chemistry      Component Value Date/Time  NA 140 12/23/2023 1619   NA 143 06/04/2021 1409   K 3.8 12/23/2023 1619   CL 105 12/23/2023 1619   CO2 24 12/23/2023 1619   BUN 17 12/23/2023 1619   BUN 10 06/04/2021 1409   CREATININE 0.96 12/23/2023 1619   CREATININE 0.95 09/21/2023 1324   CREATININE 1.14 10/15/2015 0811      Component Value Date/Time   CALCIUM  9.8 12/23/2023 1619   ALKPHOS 70 12/23/2023 1619   AST 25 12/23/2023 1619   AST 21 09/21/2023 1324   ALT 18 12/23/2023 1619   ALT 16 09/21/2023 1324   BILITOT 1.0 12/23/2023 1619   BILITOT 1.3 (H) 09/21/2023 1324       RADIOGRAPHIC STUDIES: DG Chest 2 View Result Date: 12/28/2023 CLINICAL DATA:  TB test EXAM: CHEST - 2 VIEW COMPARISON:  Chest x-ray performed November 19, 2011 and PET-CT performed March 02, 2023 FINDINGS: Postsurgical changes from sternotomy.  Linear densities are present in the lung bases which most likely represent atelectasis or scarring. No pleural effusion or pneumothorax. Degenerative changes of the thoracic spine. IMPRESSION: 1. Linear scarring or atelectasis in the lung bases. 2. Postsurgical changes from sternotomy. 3. No definite plain film evidence of active tuberculosis. Electronically Signed   By: Maude Naegeli M.D.   On: 12/28/2023 06:17   MR Brain W Wo Contrast Result Date: 12/14/2023 EXAM: MRI BRAIN WITH AND WITHOUT CONTRAST 12/10/2023 03:55:30 PM TECHNIQUE: Multiplanar multisequence MRI of the head/brain was performed with and without the administration of intravenous contrast. CONTRAST: 10 mL of Gadavist . COMPARISON: [facility] PET/CT 08/21/2021. 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 FINDINGS: BRAIN AND VENTRICLES: Artifact from dental hardware/ prosthesis limits evaluation, particularly on diffusion and susceptibility weighted sequences. No acute infarct. No acute intracranial hemorrhage. No mass effect or midline shift. No hydrocephalus. The sella is unremarkable. Normal flow voids. There is a small faint focus of enhancement in the left parietal lobe subcortical white matter (series 18, image 22; series 16, image 96) with associated T2/FLAIR hyperintensity. There are a few punctate foci of susceptibility which may relate to the sequelae of remote microhemorrhages. Bifrontal falcine calcifications. ORBITS: No acute abnormality. SINUSES: No acute abnormality. BONES AND SOFT TISSUES: Normal bone marrow signal and enhancement. No acute soft tissue abnormality. IMPRESSION: 1. Small faint focus of enhancement in the left parietal lobe is favored to be benign and possibly vascular, such as a capillary telangiectasia; No prior MR images are available for comparison, recommend attention on follow-up imaging. 2. No acute intracranial hemorrhage, mass effect, or suspicious intracranial  enhancement. Electronically signed by: prentice spade 12/14/2023 02:56 PM EST RP Workstation: [id number]    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 Patrcia. The patient has been doing well except for recent visual changes.  He was seen by ophthalmology and currently undergoing further evaluation for this condition including left to rule out paraneoplastic syndrome. Assessment and Plan Assessment & Plan Chronic lymphocytic leukemia with Q11 deletion Chronic lymphocytic leukemia diagnosed in August 2024 with Q11 deletion, a high-risk abnormality associated with rapid disease progression and shorter progression-free survival. Current white blood count is elevated but not significantly concerning. Unlikely to be the cause of peripheral vision loss. - Repeated blood count today - Ordered SPEP to evaluate immunoglobulin levels  Peripheral vision loss,  under evaluation Peripheral vision loss under evaluation. Differential diagnosis includes paraneoplastic syndrome, though unlikely related to CLL. Awaiting results from paraneoplastic syndrome testing ordered by Dr. Elner. - Await results from paraneoplastic syndrome testing - Continue evaluation with ophthalmology He was advised to call immediately if he has any other concerning symptoms in the interval.  The patient voices understanding of current disease status and treatment options and is in agreement with the current care plan.  All questions were answered. The patient knows to call the clinic with any problems, questions or concerns. We can certainly see the patient much sooner if necessary.  The total time spent in the appointment was 30 minutes including review of chart and various tests results, discussions about plan of care and coordination of care plan  .   Disclaimer: This note was dictated with voice recognition software. Similar sounding words can inadvertently be transcribed and may not be corrected upon review.

## 2024-01-02 LAB — PARANEOPLASTIC AB
AGNA-1: NEGATIVE
Amphiphysin Antibody: NEGATIVE
Anti-Hu Ab: NEGATIVE
Anti-Ri Ab: NEGATIVE
Anti-Yo Ab: NEGATIVE
Antineruonal nuclear Ab Type 3: NEGATIVE
CASPR2 Antibody,Cell-based IFA: NEGATIVE
CRMP-5 IgG: NEGATIVE
Interpretation: NEGATIVE
LGI1 Antibody, Cell-based IFA: NEGATIVE
Purkinje Cell Cyto Ab Type 2: NEGATIVE
Purkinje Cell Cyto Ab Type Tr: NEGATIVE
VGCC Antibody: 9.3 pmol/L (ref 0.0–30.0)

## 2024-01-04 LAB — MISC LABCORP TEST (SEND OUT): Labcorp test code: 167389

## 2024-01-04 LAB — PROTEIN ELECTROPHORESIS, SERUM, WITH REFLEX
A/G Ratio: 1.7 (ref 0.7–1.7)
Albumin ELP: 4 g/dL (ref 2.9–4.4)
Alpha-1-Globulin: 0.2 g/dL (ref 0.0–0.4)
Alpha-2-Globulin: 0.8 g/dL (ref 0.4–1.0)
Beta Globulin: 0.8 g/dL (ref 0.7–1.3)
Gamma Globulin: 0.5 g/dL (ref 0.4–1.8)
Globulin, Total: 2.3 g/dL (ref 2.2–3.9)
Total Protein ELP: 6.3 g/dL (ref 6.0–8.5)

## 2024-01-06 ENCOUNTER — Encounter: Payer: Self-pay | Admitting: Cardiology

## 2024-01-06 ENCOUNTER — Ambulatory Visit: Attending: Cardiology | Admitting: Cardiology

## 2024-01-06 VITALS — BP 146/73 | HR 89 | Ht 73.0 in | Wt 225.0 lb

## 2024-01-06 DIAGNOSIS — C9110 Chronic lymphocytic leukemia of B-cell type not having achieved remission: Secondary | ICD-10-CM

## 2024-01-06 DIAGNOSIS — Z952 Presence of prosthetic heart valve: Secondary | ICD-10-CM

## 2024-01-06 DIAGNOSIS — I10 Essential (primary) hypertension: Secondary | ICD-10-CM

## 2024-01-06 DIAGNOSIS — I2510 Atherosclerotic heart disease of native coronary artery without angina pectoris: Secondary | ICD-10-CM

## 2024-01-06 NOTE — Progress Notes (Signed)
 Cardiology Office Note:  .   Date:  01/06/2024  ID:  Tim Hardy, DOB 1940/07/18, MRN [id number] PCP: [name] LABOR, MD  [facility] Providers Cardiologist:  Oneil Parchment, MD    History of Present Illness: .   Tim Hardy is a 83 y.o. male Discussed the use of AI scribe software for clinical note transcription with the patient, who gave verbal consent to proceed.  History of Present Illness Tim Hardy is an 83 year old male who presents for routine follow-up.  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 No chest pain, shortness of breath, or other cardiac symptoms. He is currently being monitored for any changes in valve function.  He has chronic lymphocytic leukemia (CLL) diagnosed in 2024, which is being monitored by his hematologist.  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 A brain scan was performed as part of the evaluation for his vision changes, and the cause is still under investigation.  He is under surveillance for prostate issues, though no specific symptoms or treatments were discussed.  He experiences knee pain affecting his mobility, though no specific treatments or interventions were discussed.  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 No unusual fatigue.  Here today with his wife.  She works in youth worker     Studies Reviewed: SABRA   EKG Interpretation Date/Time:  Wednesday January 06 2024 14:24:55 EST Ventricular Rate:  89 PR Interval:  134 QRS Duration:  114 QT Interval:  358 QTC Calculation: 435 R Axis:   -12  Text Interpretation: Normal sinus rhythm Normal ECG When compared with ECG of 13-Oct-2022 14:29, No significant change was found Confirmed by Parchment Oneil ([id number]) on 01/06/2024 2:29:56 PM    Results DIAGNOSTIC Echocardiogram: Aortic valve replacement with 28 mm mean gradient  using a 25 mm Magna Ease valve (08/11/2023) EKG: Normal sinus rhythm Risk Assessment/Calculations:           Physical Exam:   VS:  BP (!) 146/73   Pulse 89   Ht 6' 1 (1.854 m)   Wt 225 lb (102.1 kg)   SpO2 96%   BMI 29.69 kg/m    Wt Readings from Last 3 Encounters:  01/06/24 225 lb (102.1 kg)  12/30/23 232 lb (105.2 kg)  09/21/23 233 lb (105.7 kg)    GEN: Well nourished, well developed in no acute distress NECK: No JVD; No carotid bruits CARDIAC: RRR, soft systolic murmur, no rubs, no gallops RESPIRATORY:  Clear to auscultation without rales, wheezing or rhonchi  ABDOMEN: Soft, non-tender, non-distended EXTREMITIES:  No edema; No deformity   ASSESSMENT AND PLAN: .    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 Valve has been in place for 12 years. No current symptoms of dyspnea or chest pain. Potential for future intervention if stenosis progresses, including heart catheterization and valve-in-valve replacement. Current management is observation with regular monitoring. - Continue monitoring with echocardiogram next year. - Scheduled follow-up in six months.  Nonobstructive coronary artery disease No new symptoms such as chest pain or dyspnea. EKG shows normal rhythm. - Continue current management and monitoring.  CLL - notes from Onc reviewed.  Genetic mutation noted.  Concern that loss of peripheral vision was  secondary to CLL.  MRI of brain unremarkable.       Dispo: 1 year.  We will check echocardiogram after that visit.  Signed, Oneil Parchment, MD

## 2024-01-06 NOTE — Patient Instructions (Signed)
 Medication Instructions:  The current medical regimen is effective;  continue present plan and medications.  *If you need a refill on your cardiac medications before your next appointment, please call your pharmacy*  Follow-Up: At [facility], you and your health needs are our priority.  As part of our continuing mission to provide you with exceptional heart care, our providers are all part of one team.  This team includes your primary Cardiologist (physician) and Advanced Practice Providers or APPs (Physician Assistants and Nurse Practitioners) who all work together to provide you with the care you need, when you need it.  Your next appointment:   6 month(s)  Provider:   Dorothye Gathers, MD    We recommend signing up for the patient portal called "MyChart".  Sign up information is provided on this After Visit Summary.  MyChart is used to connect with patients for Virtual Visits (Telemedicine).  Patients are able to view lab/test results, encounter notes, upcoming appointments, etc.  Non-urgent messages can be sent to your provider as well.   To learn more about what you can do with MyChart, go to [email or web address].

## 2024-03-21 ENCOUNTER — Other Ambulatory Visit

## 2024-03-21 ENCOUNTER — Ambulatory Visit: Admitting: Internal Medicine

## 2024-03-31 ENCOUNTER — Inpatient Hospital Stay

## 2024-03-31 ENCOUNTER — Inpatient Hospital Stay: Admitting: Internal Medicine
